# Patient Record
Sex: Female | Born: 1965 | Race: Black or African American | Hispanic: No | Marital: Single | Smoking: Never smoker
Health system: Southern US, Community
[De-identification: ages and names within clinical notes are randomized; demographics above are authoritative.]

## PROBLEM LIST (undated history)

## (undated) DIAGNOSIS — I428 Other cardiomyopathies: Secondary | ICD-10-CM

## (undated) DIAGNOSIS — G4733 Obstructive sleep apnea (adult) (pediatric): Secondary | ICD-10-CM

## (undated) DIAGNOSIS — I509 Heart failure, unspecified: Secondary | ICD-10-CM

## (undated) DIAGNOSIS — I10 Essential (primary) hypertension: Secondary | ICD-10-CM

## (undated) DIAGNOSIS — E042 Nontoxic multinodular goiter: Secondary | ICD-10-CM

## (undated) DIAGNOSIS — K449 Diaphragmatic hernia without obstruction or gangrene: Secondary | ICD-10-CM

## (undated) DIAGNOSIS — Z9289 Personal history of other medical treatment: Secondary | ICD-10-CM

## (undated) DIAGNOSIS — J9690 Respiratory failure, unspecified, unspecified whether with hypoxia or hypercapnia: Secondary | ICD-10-CM

## (undated) DIAGNOSIS — C8590 Non-Hodgkin lymphoma, unspecified, unspecified site: Secondary | ICD-10-CM

## (undated) HISTORY — PX: KNEE SURGERY: SHX244

## (undated) HISTORY — PX: OTHER SURGICAL HISTORY: SHX169

## (undated) HISTORY — DX: Obstructive sleep apnea (adult) (pediatric): G47.33

## (undated) HISTORY — PX: TUBAL LIGATION: SHX77

## (undated) HISTORY — PX: FEMUR SURGERY: SHX943

## (undated) HISTORY — PX: CHOLECYSTECTOMY: SHX55

---

## 2010-07-01 ENCOUNTER — Emergency Department (HOSPITAL_COMMUNITY): Payer: BC Managed Care – PPO

## 2010-07-01 ENCOUNTER — Emergency Department (HOSPITAL_COMMUNITY)
Admission: EM | Admit: 2010-07-01 | Discharge: 2010-07-01 | Disposition: A | Discharge status: Home / Self Care | Payer: BC Managed Care – PPO | Attending: Emergency Medicine | Admitting: Emergency Medicine

## 2010-07-01 DIAGNOSIS — M25539 Pain in unspecified wrist: Secondary | ICD-10-CM | POA: Insufficient documentation

## 2010-07-01 DIAGNOSIS — I10 Essential (primary) hypertension: Secondary | ICD-10-CM | POA: Insufficient documentation

## 2012-11-11 DIAGNOSIS — G8929 Other chronic pain: Secondary | ICD-10-CM | POA: Insufficient documentation

## 2012-11-11 DIAGNOSIS — C8590 Non-Hodgkin lymphoma, unspecified, unspecified site: Secondary | ICD-10-CM | POA: Insufficient documentation

## 2013-04-29 ENCOUNTER — Emergency Department (HOSPITAL_BASED_OUTPATIENT_CLINIC_OR_DEPARTMENT_OTHER): Payer: BC Managed Care – PPO

## 2013-04-29 ENCOUNTER — Emergency Department (HOSPITAL_BASED_OUTPATIENT_CLINIC_OR_DEPARTMENT_OTHER)
Admission: EM | Admit: 2013-04-29 | Discharge: 2013-04-30 | Disposition: A | Discharge status: Home / Self Care | Payer: BC Managed Care – PPO | Attending: Emergency Medicine | Admitting: Emergency Medicine

## 2013-04-29 ENCOUNTER — Encounter (HOSPITAL_BASED_OUTPATIENT_CLINIC_OR_DEPARTMENT_OTHER): Payer: Self-pay | Admitting: Emergency Medicine

## 2013-04-29 DIAGNOSIS — I10 Essential (primary) hypertension: Secondary | ICD-10-CM | POA: Insufficient documentation

## 2013-04-29 DIAGNOSIS — Z87898 Personal history of other specified conditions: Secondary | ICD-10-CM | POA: Insufficient documentation

## 2013-04-29 DIAGNOSIS — Z79899 Other long term (current) drug therapy: Secondary | ICD-10-CM | POA: Insufficient documentation

## 2013-04-29 DIAGNOSIS — S338XXA Sprain of other parts of lumbar spine and pelvis, initial encounter: Secondary | ICD-10-CM | POA: Insufficient documentation

## 2013-04-29 DIAGNOSIS — M542 Cervicalgia: Secondary | ICD-10-CM | POA: Insufficient documentation

## 2013-04-29 DIAGNOSIS — Y9289 Other specified places as the place of occurrence of the external cause: Secondary | ICD-10-CM | POA: Insufficient documentation

## 2013-04-29 DIAGNOSIS — T148XXA Other injury of unspecified body region, initial encounter: Secondary | ICD-10-CM

## 2013-04-29 DIAGNOSIS — X503XXA Overexertion from repetitive movements, initial encounter: Secondary | ICD-10-CM | POA: Insufficient documentation

## 2013-04-29 DIAGNOSIS — M62838 Other muscle spasm: Secondary | ICD-10-CM | POA: Insufficient documentation

## 2013-04-29 DIAGNOSIS — Y9389 Activity, other specified: Secondary | ICD-10-CM | POA: Insufficient documentation

## 2013-04-29 HISTORY — DX: Non-Hodgkin lymphoma, unspecified, unspecified site: C85.90

## 2013-04-29 HISTORY — DX: Essential (primary) hypertension: I10

## 2013-04-29 MED ORDER — METHOCARBAMOL 500 MG PO TABS
1000.0000 mg | ORAL_TABLET | Freq: Once | ORAL | Status: AC
  Administered 2013-04-29: 1000 mg via ORAL
  Filled 2013-04-29: qty 2

## 2013-04-29 MED ORDER — OXYCODONE-ACETAMINOPHEN 5-325 MG PO TABS
1.0000 | ORAL_TABLET | Freq: Once | ORAL | Status: AC
  Administered 2013-04-29: 1 via ORAL
  Filled 2013-04-29: qty 1

## 2013-04-29 NOTE — ED Notes (Signed)
Back and neck pain x 2 days. States she feels it is a pinched nerve. Her right shoulder is numb.

## 2013-04-29 NOTE — ED Provider Notes (Signed)
CSN: [id number]     Arrival date & time 04/29/13  2152 History   First MD Initiated Contact with Patient 04/29/13 2302     This chart was scribed for Lawana Hartzell Smitty Cords, MD by Manuela Schwartz, ED scribe. This patient was seen in room MH10/MH10 and the patient's care was started at 2302.  Chief Complaint  Patient presents with  . Back Pain   Patient is a 47 y.o. female presenting with back pain. The history is provided by the patient. No language interpreter was used.  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 female who presents to the Emergency Department complaining of back and neck pain, onset 2 days ago, no recent injury or trauma. She reports felt she strained her lower back while cranking/raising a bed at work. She reports ibuprofen w/out relief from pain. She states pain radiates to her right shoulder/arm (last episode was a couple of weeks ago). She has not seen anyone for this problem. She denies recent surgeries, new medicines, falls, trauma or skin rash.  Past Medical History  Diagnosis Date  . Lymphoma   . Hypertension    Past Surgical History  Procedure Laterality Date  . Tubal ligation    . Femur surgery    . Knee surgery    . Cholecystectomy    . Porta cath insertion      2 years   No family history on file. History   Substance Use Topics  . Smoking status: Never Smoker   . Smokeless tobacco: Not on file  . Alcohol Use: No   OB History   Grav Para Term Preterm Abortions TAB SAB Ect Mult Living                 Review of Systems  Constitutional: Negative for fever, chills and weight loss.  HENT: Negative for congestion and rhinorrhea.   Respiratory: Negative for cough and shortness of breath.   Cardiovascular: Negative for chest pain.  Gastrointestinal: Negative for nausea, vomiting, abdominal pain, diarrhea and bowel incontinence.  Genitourinary: Negative for bladder incontinence.  Musculoskeletal: Positive for back pain and neck pain.  Skin: Negative for color change and rash.  Neurological: Negative for syncope and weakness.  All other systems reviewed and are negative.   A complete 10 system review of systems was obtained and all systems are negative except as noted in the HPI and PMH.   Allergies  Review of patient's allergies indicates no known allergies.  Home Medications   Current Outpatient Rx  Name  Route  Sig  Dispense  Refill  . Cholecalciferol (VITAMIN D) 2000 UNITS CAPS   Oral   Take by mouth.         . Furosemide (LASIX PO)   Oral   Take by mouth.         . Iron-Folic Acid-Vit B12 (  IRON FORMULA PO)   Oral   Take by mouth.         Marland Kitchen LISINOPRIL PO   Oral   Take by mouth.          Triage Vitals: BP 132/83  Pulse 95  Temp(Src) 97.2 F (36.2 C) (Oral)  Resp 18  Ht 5' 6.5" (1.689 m)  Wt 242 lb (109.77 kg)  BMI 38.48 kg/m2  SpO2 100% Physical Exam  Nursing note and vitals reviewed. Constitutional: She is oriented to person, place, and time. She appears well-developed and well-nourished. No distress.  HENT:  Head: Normocephalic and atraumatic.  Mouth/Throat: Oropharynx is clear and moist.  Eyes: Conjunctivae are normal. Pupils are equal, round, and reactive to light. Right eye exhibits no discharge. Left eye exhibits no discharge.  Neck: Normal range  of motion. Neck supple. No tracheal deviation present.  Cardiovascular: Normal rate, regular rhythm, normal heart sounds and intact distal pulses.   No murmur heard. 2+ distal pulses Cap refill less than 2 sec bilateral hands  Pulmonary/Chest: Effort normal and breath sounds normal. No stridor. No respiratory distress. She has no wheezes. She has no rales.  Abdominal: Soft. Bowel sounds are normal. There is no tenderness.  Musculoskeletal: Normal range of motion. She exhibits no edema and no tenderness.   No step offs, crepitance nor point tenderness of the C, T, or L spine. Normal alignment. Negative neers test bilaterally Intact biceps, triceps tendons bilaterally 5/5 strength BUE/BLE BUE neurovascularly intact  Neurological: She is alert and oriented to person, place, and time. She has normal reflexes. She displays normal reflexes.  Skin: Skin is warm and dry.  Psychiatric: She has a normal mood and affect. Thought content normal.    ED Course  Procedures (including critical care time) DIAGNOSTIC STUDIES: Oxygen Saturation is 100% on room air, normal by my interpretation.    COORDINATION OF CARE: At 1120 PM Discussed treatment plan with patient which includes CXR, lumbar X-ray, pain medicine, robaxin. Patient agrees.   Labs Review Labs Reviewed - No data to display Imaging Review No results found.  EKG Interpretation   None       MDM  Will treat for muscle strain and spasm with NSAIDs pain meds and muscle relaxants   I personally performed the services described in this documentation, which was scribed in my presence. The recorded information has been reviewed and is accurate.     Freeland Pracht Smitty Cords, MD 04/30/13 [place]

## 2013-04-30 MED ORDER — HYDROCODONE-ACETAMINOPHEN 5-325 MG PO TABS: 1.0000 | ORAL_TABLET | Freq: Four times a day (QID) | ORAL | Status: DC | PRN

## 2013-04-30 MED ORDER — IBUPROFEN 800 MG PO TABS: 800.0000 mg | ORAL_TABLET | Freq: Three times a day (TID) | ORAL | Status: DC

## 2013-04-30 MED ORDER — METHOCARBAMOL 500 MG PO TABS: 500.0000 mg | ORAL_TABLET | Freq: Two times a day (BID) | ORAL | Status: DC

## 2013-05-24 ENCOUNTER — Encounter (HOSPITAL_BASED_OUTPATIENT_CLINIC_OR_DEPARTMENT_OTHER): Payer: Self-pay | Admitting: Emergency Medicine

## 2013-05-24 ENCOUNTER — Emergency Department (HOSPITAL_BASED_OUTPATIENT_CLINIC_OR_DEPARTMENT_OTHER): Payer: BC Managed Care – PPO

## 2013-05-24 ENCOUNTER — Inpatient Hospital Stay (HOSPITAL_BASED_OUTPATIENT_CLINIC_OR_DEPARTMENT_OTHER)
Admission: EM | Admit: 2013-05-24 | Discharge: 2013-05-27 | DRG: 293 | Disposition: A | Discharge status: Home / Self Care | Payer: BC Managed Care – PPO | Attending: Family Medicine | Admitting: Family Medicine

## 2013-05-24 DIAGNOSIS — Z923 Personal history of irradiation: Secondary | ICD-10-CM

## 2013-05-24 DIAGNOSIS — C8590 Non-Hodgkin lymphoma, unspecified, unspecified site: Secondary | ICD-10-CM

## 2013-05-24 DIAGNOSIS — Z87898 Personal history of other specified conditions: Secondary | ICD-10-CM

## 2013-05-24 DIAGNOSIS — M62838 Other muscle spasm: Secondary | ICD-10-CM | POA: Diagnosis present

## 2013-05-24 DIAGNOSIS — E876 Hypokalemia: Secondary | ICD-10-CM | POA: Diagnosis present

## 2013-05-24 DIAGNOSIS — Z8249 Family history of ischemic heart disease and other diseases of the circulatory system: Secondary | ICD-10-CM

## 2013-05-24 DIAGNOSIS — G8929 Other chronic pain: Secondary | ICD-10-CM | POA: Diagnosis present

## 2013-05-24 DIAGNOSIS — Z79899 Other long term (current) drug therapy: Secondary | ICD-10-CM

## 2013-05-24 DIAGNOSIS — Z9221 Personal history of antineoplastic chemotherapy: Secondary | ICD-10-CM

## 2013-05-24 DIAGNOSIS — I5033 Acute on chronic diastolic (congestive) heart failure: Principal | ICD-10-CM | POA: Diagnosis present

## 2013-05-24 DIAGNOSIS — F17200 Nicotine dependence, unspecified, uncomplicated: Secondary | ICD-10-CM | POA: Diagnosis present

## 2013-05-24 DIAGNOSIS — E042 Nontoxic multinodular goiter: Secondary | ICD-10-CM | POA: Diagnosis present

## 2013-05-24 DIAGNOSIS — I509 Heart failure, unspecified: Secondary | ICD-10-CM | POA: Diagnosis present

## 2013-05-24 DIAGNOSIS — I10 Essential (primary) hypertension: Secondary | ICD-10-CM

## 2013-05-24 HISTORY — DX: Respiratory failure, unspecified, unspecified whether with hypoxia or hypercapnia: J96.90

## 2013-05-24 HISTORY — DX: Personal history of other medical treatment: Z92.89

## 2013-05-24 HISTORY — DX: Nontoxic multinodular goiter: E04.2

## 2013-05-24 LAB — CBC WITH DIFFERENTIAL/PLATELET
BASOS ABS: 0 10*3/uL (ref 0.0–0.1)
BASOS PCT: 0 % (ref 0–1)
Eosinophils Absolute: 0.3 10*3/uL (ref 0.0–0.7)
Eosinophils Relative: 3 % (ref 0–5)
HEMATOCRIT: 36.4 % (ref 36.0–46.0)
HEMOGLOBIN: 11.8 g/dL — AB (ref 12.0–15.0)
LYMPHS PCT: 22 % (ref 12–46)
Lymphs Abs: 2.1 10*3/uL (ref 0.7–4.0)
MCH: 29.4 pg (ref 26.0–34.0)
MCHC: 32.4 g/dL (ref 30.0–36.0)
MCV: 90.8 fL (ref 78.0–100.0)
MONOS PCT: 7 % (ref 3–12)
Monocytes Absolute: 0.6 10*3/uL (ref 0.1–1.0)
NEUTROS ABS: 6.4 10*3/uL (ref 1.7–7.7)
NEUTROS PCT: 68 % (ref 43–77)
Platelets: 324 10*3/uL (ref 150–400)
RBC: 4.01 MIL/uL (ref 3.87–5.11)
RDW: 13.1 % (ref 11.5–15.5)
WBC: 9.3 10*3/uL (ref 4.0–10.5)

## 2013-05-24 LAB — BASIC METABOLIC PANEL
BUN: 18 mg/dL (ref 6–23)
CHLORIDE: 103 meq/L (ref 96–112)
CO2: 27 meq/L (ref 19–32)
Calcium: 9.1 mg/dL (ref 8.4–10.5)
Creatinine, Ser: 1.1 mg/dL (ref 0.50–1.10)
GFR calc non Af Amer: 59 mL/min — ABNORMAL LOW (ref >90)
GFR, EST AFRICAN AMERICAN: 69 mL/min — AB (ref >90)
Glucose, Bld: 103 mg/dL — ABNORMAL HIGH (ref 70–99)
POTASSIUM: 3.7 meq/L (ref 3.7–5.3)
Sodium: 142 mEq/L (ref 137–147)

## 2013-05-24 LAB — D-DIMER, QUANTITATIVE (NOT AT ARMC): D DIMER QUANT: 0.4 ug{FEU}/mL (ref 0.00–0.48)

## 2013-05-24 LAB — PRO B NATRIURETIC PEPTIDE: PRO B NATRI PEPTIDE: 2261 pg/mL — AB (ref 0–125)

## 2013-05-24 LAB — TROPONIN I: Troponin I: <0.3 ng/mL (ref <0.30)

## 2013-05-24 MED ORDER — ALBUTEROL SULFATE (2.5 MG/3ML) 0.083% IN NEBU
5.0000 mg | INHALATION_SOLUTION | Freq: Once | RESPIRATORY_TRACT | Status: AC
  Administered 2013-05-24: 5 mg via RESPIRATORY_TRACT
  Filled 2013-05-24: qty 6

## 2013-05-24 MED ORDER — FUROSEMIDE 10 MG/ML IJ SOLN
40.0000 mg | Freq: Once | INTRAMUSCULAR | Status: AC
  Administered 2013-05-24: 40 mg via INTRAVENOUS
  Filled 2013-05-24: qty 4

## 2013-05-24 NOTE — ED Notes (Addendum)
Pt states she is on lasix which usually helps with her SOB- states she is more winded since yesterday. States [facility] has ? Diagnosed her with COPD and/CHF. States they have referred her to [facility] for cardiology

## 2013-05-24 NOTE — ED Notes (Signed)
I raised patient in bed, helped her to comfortable position, then I placed taped-on type pulse ox device. I then used alcohol wipes over ecg pads to confirm good reading and placement. Patient resting comfortably asking for water to drink.

## 2013-05-24 NOTE — ED Notes (Signed)
I took second BP and got 133/93 a moment after last entry.

## 2013-05-24 NOTE — ED Notes (Signed)
Respiratory therapy called to bedside to assist with the wheezing, administer treatment.

## 2013-05-24 NOTE — ED Provider Notes (Addendum)
CSN: [id number]     Arrival date & time 05/24/13  1812 History   First MD Initiated Contact with Patient 05/24/13 2017     Chief Complaint  Patient presents with  . Shortness of Breath   (Consider location/radiation/quality/duration/timing/severity/associated sxs/prior Treatment) HPI Complains of shortness of breath and dyspnea with minimal exertion, onset one week ago. With minimal cough. No fever. Admits to some mild right-sided chest pain worse with cough.. Patient has taken Lasix for "fluid" though no known history of congestive heart failure. Her primary care physician has arranged for her to have an outpatient echocardiogram. Denies orthopnea. Past Medical History  Diagnosis Date  . Lymphoma   . Hypertension   . Multiple thyroid nodules    respiratory failure Past Surgical History  Procedure Laterality Date  . Tubal ligation    . Femur surgery    . Knee surgery    . Cholecystectomy    . Porta cath insertion      2 years   No family history on file. History  Substance Use Topics  . Smoking status: Current Some Day Smoker  . Smokeless tobacco: Never Used  . Alcohol Use: 0.6 oz/week    1 Glasses of wine per week   OB History   Grav Para Term Preterm Abortions TAB SAB Ect Mult Living                 Review of Systems  Respiratory: Positive for cough, shortness of breath and wheezing.   All other systems reviewed and are negative.    Allergies  Review of patient's allergies indicates no known allergies.  Home Medications   Current Outpatient Rx  Name  Route  Sig  Dispense  Refill  . Cholecalciferol (VITAMIN D) 2000 UNITS CAPS   Oral   Take by mouth.         . furosemide (LASIX) 40 MG tablet   Oral   Take 40 mg by mouth.         . gabapentin (NEURONTIN) 100 MG capsule   Oral   Take 100 mg by mouth daily.         Marland Kitchen HYDROcodone-acetaminophen (NORCO) 5-325 MG per tablet   Oral   Take 1 tablet by mouth every 6 (six) hours as needed.   10 tablet    0   . ibuprofen (ADVIL,MOTRIN) 800 MG tablet   Oral   Take 1 tablet (800 mg total) by mouth 3 (three) times daily. Take with food   21 tablet   0   . Iron-Folic Acid-Vit O96 (IRON FORMULA PO)   Oral   Take by mouth.         Marland Kitchen lisinopril-hydrochlorothiazide (PRINZIDE,ZESTORETIC) 20-25 MG per tablet   Oral   Take 1 tablet by mouth daily.         . methocarbamol (ROBAXIN) 500 MG tablet   Oral   Take 1 tablet (500 mg total) by mouth 2 (two) times daily.   20 tablet   0    BP 160/119  Pulse 104  Temp(Src) 97.8 F (36.6 C) (Oral)  Resp 22  Ht 5\' 6"  (1.676 m)  Wt 242 lb (109.77 kg)  BMI 39.08 kg/m2  SpO2 100% Physical Exam  Nursing note and vitals reviewed. Constitutional: She appears well-developed and well-nourished. She appears distressed.  Mild respiratory distress.  HENT:  Head: Normocephalic and atraumatic.  Eyes: Conjunctivae are normal. Pupils are equal, round, and reactive to light.  Neck: Neck supple. No JVD  present. No tracheal deviation present. No thyromegaly present.  Cardiovascular: Normal rate and regular rhythm.   No murmur heard. Pulmonary/Chest: Breath sounds normal. She has no wheezes.  Mild respiratory distress.. Prolonged expiration phase with expiratory wheezes Morbidly obese  Abdominal: Soft. Bowel sounds are normal. She exhibits no distension. There is no tenderness.  Morbidly obese  Musculoskeletal: Normal range of motion. She exhibits no edema and no tenderness.  Neurological: She is alert. Coordination normal.  Skin: Skin is warm and dry. No rash noted.  Psychiatric: She has a normal mood and affect.    ED Course  Procedures (including critical care time) Labs Review Labs Reviewed - No data to display Imaging Review No results found.  EKG Interpretation    Date/Time:  Saturday May 24 2013 18:23:06 EST Ventricular Rate:  106 PR Interval:  140 QRS Duration: 140 QT Interval:  392 QTC Calculation: 520 R Axis:   140 Text  Interpretation:  Sinus tachycardia Biatrial enlargement Right axis deviation Non-specific intra-ventricular conduction block Abnormal QRS-T angle, consider primary T wave abnormality Abnormal ECG No old tracing to compare Confirmed by Ethelda Chick  MD, Reegan Mctighe ([id number]) on 05/24/2013 8:36:11 PM          chest xray viewed by me Results for orders placed during the hospital encounter of 05/24/13  CBC WITH DIFFERENTIAL      Result Value Range   WBC 9.3  4.0 - 10.5 K/uL   RBC 4.01  3.87 - 5.11 MIL/uL   Hemoglobin 11.8 (*) 12.0 - 15.0 g/dL   HCT 41.7  40.8 - 14.4 %   MCV 90.8  78.0 - 100.0 fL   MCH 29.4  26.0 - 34.0 pg   MCHC 32.4  30.0 - 36.0 g/dL   RDW 81.8  56.3 - 14.9 %   Platelets 324  150 - 400 K/uL   Neutrophils Relative % 68  43 - 77 %   Neutro Abs 6.4  1.7 - 7.7 K/uL   Lymphocytes Relative 22  12 - 46 %   Lymphs Abs 2.1  0.7 - 4.0 K/uL   Monocytes Relative 7  3 - 12 %   Monocytes Absolute 0.6  0.1 - 1.0 K/uL   Eosinophils Relative 3  0 - 5 %   Eosinophils Absolute 0.3  0.0 - 0.7 K/uL   Basophils Relative 0  0 - 1 %   Basophils Absolute 0.0  0.0 - 0.1 K/uL  BASIC METABOLIC PANEL      Result Value Range   Sodium 142  137 - 147 mEq/L   Potassium 3.7  3.7 - 5.3 mEq/L   Chloride 103  96 - 112 mEq/L   CO2 27  19 - 32 mEq/L   Glucose, Bld 103 (*) 70 - 99 mg/dL   BUN 18  6 - 23 mg/dL   Creatinine, Ser 7.02  0.50 - 1.10 mg/dL   Calcium 9.1  8.4 - 63.7 mg/dL   GFR calc non Af Amer 59 (*) >90 mL/min   GFR calc Af Amer 69 (*) >90 mL/min  PRO B NATRIURETIC PEPTIDE      Result Value Range   Pro B Natriuretic peptide (BNP) 2261.0 (*) 0 - 125 pg/mL  TROPONIN I      Result Value Range   Troponin I <0.30  <0.30 ng/mL  D-DIMER, QUANTITATIVE      Result Value Range   D-Dimer, Quant 0.40  0.00 - 0.48 ug/mL-FEU   23:20 PM capacious his breathing was minimally  improved after nebulized treatment pain albuterol. Lasix ordered.  Chest xray viewed by me Dg Chest 2 View  05/24/2013   CLINICAL  DATA:  Shortness of breath.  EXAM: CHEST  2 VIEW  COMPARISON:  04/29/2013  FINDINGS: Left-sided Port-A-Cath is unchanged. Lungs are adequately inflated with minimal prominence of the right infrahilar markings which may be due to mild vascular congestion although cannot exclude early infection. Cardiomediastinal silhouette and remainder of the exam is unchanged.  IMPRESSION: Mild prominence of the right infrahilar markings which may be due to mild vascular congestion although cannot exclude early infection.   Electronically Signed   By: Marin Olp M.D.   On: 05/24/2013 20:22   Dg Chest 2 View  04/30/2013   CLINICAL DATA:  Upper back pain.  EXAM: CHEST  2 VIEW  COMPARISON:  None.  FINDINGS: The lungs are well-aerated and clear. There is no evidence of focal opacification, pleural effusion or pneumothorax.  The heart is normal in size; the mediastinal contour is within normal limits. No acute osseous abnormalities are seen. Clips are noted within the right upper quadrant, reflecting prior cholecystectomy. A left-sided chest port is noted, ending about the mid SVC.  IMPRESSION: No acute cardiopulmonary process seen. No displaced rib fractures identified.   Electronically Signed   By: Garald Balding M.D.   On: 04/30/2013 00:11   Dg Cervical Spine Complete  04/30/2013   CLINICAL DATA:  Right-sided neck pain.  EXAM: CERVICAL SPINE  4+ VIEWS  COMPARISON:  None.  FINDINGS: There is no evidence of fracture or subluxation. Vertebral bodies demonstrate normal height and alignment. Minimal disc space narrowing is noted along the lower cervical spine, with scattered anterior disc osteophyte complexes. Prevertebral soft tissues are within normal limits. The provided odontoid view demonstrates no significant abnormality.  The visualized lung apices are clear. A left-sided chest port is partially imaged.  IMPRESSION: No evidence of fracture or subluxation along the cervical spine.   Electronically Signed   By: Garald Balding M.D.   On: 04/30/2013 00:09   Dg Lumbar Spine Complete  04/30/2013   CLINICAL DATA:  Lower back pain for several days.  EXAM: LUMBAR SPINE - COMPLETE 4+ VIEW  COMPARISON:  None.  FINDINGS: There is no evidence of fracture or subluxation. Vertebral bodies demonstrate normal height and alignment. Intervertebral disc spaces are preserved. The visualized neural foramina are grossly unremarkable in appearance.  The visualized bowel gas pattern is unremarkable in appearance; air and stool are noted within the colon. The sacroiliac joints are within normal limits. Clips are noted within the right upper quadrant, reflecting prior cholecystectomy.  IMPRESSION: No evidence of fracture or subluxation along the lumbar spine.   Electronically Signed   By: Garald Balding M.D.   On: 04/30/2013 00:11     MDM  No diagnosis found. Doubt acute coronary syndrome. No chest pain. Normal troponin . Low pretest clinical soft suspicion for pulmonary embolism. Negative d-dimer. Elevated BNP and chest x-ray suggestive of congestive heart failure. Plan intravenous iritis. We will pull nitrates as patient's blood pressures are marginal. I spoke with Dr. Gasper Lloyd. Plan transfer [facility], telemetry Diagnosis congestive heart failure    Orlie Dakin, MD 05/24/13 [place], MD 05/24/13 2356

## 2013-05-25 ENCOUNTER — Encounter (HOSPITAL_COMMUNITY): Payer: Self-pay | Admitting: Internal Medicine

## 2013-05-25 ENCOUNTER — Inpatient Hospital Stay (HOSPITAL_COMMUNITY): Payer: BC Managed Care – PPO

## 2013-05-25 DIAGNOSIS — C8589 Other specified types of non-Hodgkin lymphoma, extranodal and solid organ sites: Secondary | ICD-10-CM

## 2013-05-25 DIAGNOSIS — E042 Nontoxic multinodular goiter: Secondary | ICD-10-CM | POA: Diagnosis present

## 2013-05-25 DIAGNOSIS — I10 Essential (primary) hypertension: Secondary | ICD-10-CM | POA: Diagnosis present

## 2013-05-25 DIAGNOSIS — I509 Heart failure, unspecified: Secondary | ICD-10-CM | POA: Diagnosis present

## 2013-05-25 LAB — CBC
HCT: 38.6 % (ref 36.0–46.0)
HEMOGLOBIN: 12.5 g/dL (ref 12.0–15.0)
MCH: 29.6 pg (ref 26.0–34.0)
MCHC: 32.4 g/dL (ref 30.0–36.0)
MCV: 91.3 fL (ref 78.0–100.0)
PLATELETS: 303 10*3/uL (ref 150–400)
RBC: 4.23 MIL/uL (ref 3.87–5.11)
RDW: 13.6 % (ref 11.5–15.5)
WBC: 9.3 10*3/uL (ref 4.0–10.5)

## 2013-05-25 LAB — COMPREHENSIVE METABOLIC PANEL
ALBUMIN: 3.3 g/dL — AB (ref 3.5–5.2)
ALT: 15 U/L (ref 0–35)
AST: 14 U/L (ref 0–37)
Alkaline Phosphatase: 65 U/L (ref 39–117)
BILIRUBIN TOTAL: 0.6 mg/dL (ref 0.3–1.2)
BUN: 17 mg/dL (ref 6–23)
CALCIUM: 8.9 mg/dL (ref 8.4–10.5)
CO2: 27 mEq/L (ref 19–32)
CREATININE: 1.06 mg/dL (ref 0.50–1.10)
Chloride: 100 mEq/L (ref 96–112)
GFR calc Af Amer: 72 mL/min — ABNORMAL LOW (ref >90)
GFR calc non Af Amer: 62 mL/min — ABNORMAL LOW (ref >90)
Glucose, Bld: 124 mg/dL — ABNORMAL HIGH (ref 70–99)
Potassium: 3.2 mEq/L — ABNORMAL LOW (ref 3.7–5.3)
Sodium: 141 mEq/L (ref 137–147)
TOTAL PROTEIN: 6.9 g/dL (ref 6.0–8.3)

## 2013-05-25 LAB — TROPONIN I: Troponin I: <0.3 ng/mL (ref <0.30)

## 2013-05-25 LAB — HEMOGLOBIN A1C
Hgb A1c MFr Bld: 5.3 % (ref <5.7)
Mean Plasma Glucose: 105 mg/dL (ref <117)

## 2013-05-25 LAB — MAGNESIUM: MAGNESIUM: 2 mg/dL (ref 1.5–2.5)

## 2013-05-25 LAB — POTASSIUM: POTASSIUM: 3.5 meq/L — AB (ref 3.7–5.3)

## 2013-05-25 LAB — TSH: TSH: 1.649 u[IU]/mL (ref 0.350–4.500)

## 2013-05-25 LAB — PROTIME-INR
INR: 1.05 (ref 0.00–1.49)
Prothrombin Time: 13.5 seconds (ref 11.6–15.2)

## 2013-05-25 MED ORDER — FUROSEMIDE 10 MG/ML IJ SOLN
40.0000 mg | Freq: Every day | INTRAMUSCULAR | Status: DC
  Administered 2013-05-25 – 2013-05-26 (×2): 40 mg via INTRAVENOUS
  Filled 2013-05-25 (×3): qty 4

## 2013-05-25 MED ORDER — SODIUM CHLORIDE 0.9 % IJ SOLN
3.0000 mL | Freq: Two times a day (BID) | INTRAMUSCULAR | Status: DC
  Administered 2013-05-25 – 2013-05-27 (×6): 3 mL via INTRAVENOUS

## 2013-05-25 MED ORDER — HYDROCHLOROTHIAZIDE 25 MG PO TABS
25.0000 mg | ORAL_TABLET | Freq: Every day | ORAL | Status: DC
  Administered 2013-05-25 – 2013-05-26 (×2): 25 mg via ORAL
  Filled 2013-05-25 (×2): qty 1

## 2013-05-25 MED ORDER — POTASSIUM CHLORIDE CRYS ER 20 MEQ PO TBCR
40.0000 meq | EXTENDED_RELEASE_TABLET | Freq: Once | ORAL | Status: AC
  Administered 2013-05-25: 40 meq via ORAL
  Filled 2013-05-25: qty 2

## 2013-05-25 MED ORDER — ACETAMINOPHEN 325 MG PO TABS: 650.0000 mg | ORAL_TABLET | Freq: Four times a day (QID) | ORAL | Status: DC | PRN

## 2013-05-25 MED ORDER — FUROSEMIDE 10 MG/ML IJ SOLN
40.0000 mg | Freq: Once | INTRAMUSCULAR | Status: AC
  Administered 2013-05-25: 40 mg via INTRAVENOUS

## 2013-05-25 MED ORDER — LISINOPRIL-HYDROCHLOROTHIAZIDE 20-25 MG PO TABS: 1.0000 | ORAL_TABLET | Freq: Every day | ORAL | Status: DC

## 2013-05-25 MED ORDER — ENOXAPARIN SODIUM 40 MG/0.4ML ~~LOC~~ SOLN
40.0000 mg | SUBCUTANEOUS | Status: DC
  Administered 2013-05-25 – 2013-05-26 (×2): 40 mg via SUBCUTANEOUS
  Filled 2013-05-25 (×3): qty 0.4

## 2013-05-25 MED ORDER — ASPIRIN EC 81 MG PO TBEC
81.0000 mg | DELAYED_RELEASE_TABLET | Freq: Every day | ORAL | Status: DC
  Administered 2013-05-25 – 2013-05-27 (×3): 81 mg via ORAL
  Filled 2013-05-25 (×4): qty 1

## 2013-05-25 MED ORDER — LISINOPRIL 20 MG PO TABS
20.0000 mg | ORAL_TABLET | Freq: Every day | ORAL | Status: DC
  Administered 2013-05-25 – 2013-05-27 (×3): 20 mg via ORAL
  Filled 2013-05-25 (×3): qty 1

## 2013-05-25 MED ORDER — HYDROCODONE-ACETAMINOPHEN 5-325 MG PO TABS
1.0000 | ORAL_TABLET | Freq: Four times a day (QID) | ORAL | Status: DC | PRN
  Administered 2013-05-25 – 2013-05-27 (×6): 1 via ORAL
  Filled 2013-05-25 (×6): qty 1

## 2013-05-25 MED ORDER — ACETAMINOPHEN 650 MG RE SUPP: 650.0000 mg | Freq: Four times a day (QID) | RECTAL | Status: DC | PRN

## 2013-05-25 MED ORDER — METHOCARBAMOL 500 MG PO TABS
500.0000 mg | ORAL_TABLET | Freq: Two times a day (BID) | ORAL | Status: DC
  Administered 2013-05-25 – 2013-05-27 (×6): 500 mg via ORAL
  Filled 2013-05-25 (×7): qty 1

## 2013-05-25 MED ORDER — GABAPENTIN 100 MG PO CAPS
100.0000 mg | ORAL_CAPSULE | Freq: Every day | ORAL | Status: DC
  Administered 2013-05-25 – 2013-05-27 (×3): 100 mg via ORAL
  Filled 2013-05-25 (×3): qty 1

## 2013-05-25 NOTE — ED Notes (Signed)
Pt did void prior to leaving with [facility].

## 2013-05-25 NOTE — ED Notes (Signed)
[facility] here and has assumed care of the patient.  Spoke with Sharyn Lull, [place] and informed her that patient does have right upper chest wall pain that is worse with movement....unchanged from original presentation.  No further questions were voiced.

## 2013-05-25 NOTE — Progress Notes (Signed)
Patient ID: Darlene Pierce, female   DOB: 05/13/1966, 48 y.o.   MRN: [id number] [facility] PROGRESS NOTE  Darlene Pierce CWC:[id number] DOB: June 30, 1965 DOA: 05/24/2013 PCP: No PCP Per Patient  Brief narrative: 48 y.o. female with Non-Hodgkin's lymphoma status post radiation in 2000 and chemotherapy in 2012, hypertension, thyroid nodule, admitted to [facility] 2 years ago with respiratory failure and was on ventilator, history of coronary angiogram last year, lives at home and has presented with main concern of one week duration of progressively worsening shortness of breath, initially present with exertion and now present at rest as well, associated with 2-3 pillow orthopnea, non productive cough. In addition she is concerned with dull chest discomfort that appears intermittently, 8/10 in severity and lasting 20-30 minutes at the time, occasionally but not consistently radiating to the left and right shoulder area, worse with movement and no specific alleviating factors. Pt has denies fevers, chills, abdominal or urinary concerns.   Principal Problem:   Acute on chronic heart failure - pt is clinically stable this AM - continuing Lasix for now - will need to follow upon 2 D ECHO - continue to monitor daily weights, I's and O's - monitor renal function while pt is on Lasix  Active Problems:   Congestive heart failure - weight this Am 246 lbs and will continue to trend while diuresing - management with Lasix as noted above    Chest tightness - possibly related to CHF - 3 sets of CE's negative - no events on telemetry - will provide supportive care for now with analgesia as needed    Hypokalemia  - secondary to Lasix, supplement and repeat BMP in AM   Lymphoma - hx of lymphoma, stable   Hypertension - reasonable inpatient control - continue Lisinopril, HCTZ, Lasix   Multiple thyroid nodules  Consultants:  None  Procedures/Studies: Dg Chest 2 View   05/24/2013   Mild prominence of the right infrahilar markings which may be due to mild vascular congestion although cannot exclude early infection.   Antibiotics:  None  Code Status: Full Family Communication: Pt at bedside Disposition Plan: Home when medically stable  HPI/Subjective: No events overnight.   Objective: Filed Vitals:   05/25/13 0200 05/25/13 0425 05/25/13 0500 05/25/13 1048  BP: 135/97  97/55 119/82  Pulse: 88  76   Temp: 97.9 F (36.6 C)  97.3 F (36.3 C)   TempSrc: Oral     Resp: 20  18   Height: 5' 6.5" (1.689 m)     Weight: 111.585 kg (246 lb) 111.585 kg (246 lb)    SpO2: 96%  99%     Intake/Output Summary (Last 24 hours) at 05/25/13 1327 Last data filed at 05/25/13 1051  Gross per 24 hour  Intake      3 ml  Output      0 ml  Net      3 ml    Exam:   General:  Pt is alert, follows commands appropriately, not in acute distress  Cardiovascular: Regular rate and rhythm, S1/S2, no murmurs, no rubs, no gallops  Respiratory: Clear to auscultation bilaterally, no wheezing, bibasilar crackles   Abdomen: Soft, non tender, non distended, bowel sounds present, no guarding  Extremities: Trace bilateral pitting LE edema, pulses DP and PT palpable bilaterally  Neuro: Grossly nonfocal  Data Reviewed: Basic Metabolic Panel:  Recent Labs Lab 05/24/13 2040 05/25/13 0450  NA 142 141  K 3.7 3.2*  CL 103 100  CO2  27 27  GLUCOSE 103* 124*  BUN 18 17  CREATININE 1.10 1.06  CALCIUM 9.1 8.9   Liver Function Tests:  Recent Labs Lab 05/25/13 0450  AST 14  ALT 15  ALKPHOS 65  BILITOT 0.6  PROT 6.9  ALBUMIN 3.3*   CBC:  Recent Labs Lab 05/24/13 2040 05/25/13 0450  WBC 9.3 9.3  NEUTROABS 6.4  --   HGB 11.8* 12.5  HCT 36.4 38.6  MCV 90.8 91.3  PLT 324 303   Cardiac Enzymes:  Recent Labs Lab 05/24/13 2040 05/25/13 0450 05/25/13 0935  TROPONINI <0.30 <0.30 <0.30    Scheduled Meds: . aspirin EC  81 mg Oral Daily  . enoxaparin  injection  40  mg Subcutaneous Q24H  . furosemide  40 mg Intravenous Daily  . gabapentin  100 mg Oral Daily  . hydrochlorothiazide  25 mg Oral Daily  . lisinopril  20 mg Oral Daily  . methocarbamol  500 mg Oral BID   Continuous Infusions:    Faye Ramsay, MD  [facility] Pager [phone]  If 7PM-7AM, please contact night-coverage [email or web address] Password [email or web address] 05/25/2013, 1:27 PM   LOS: 1 day

## 2013-05-25 NOTE — H&P (Signed)
[facility] History and Physical  Patient: Darlene Pierce  PPI:[id number]  DOB: [birth year]  DOS: the patient was seen and examined on 05/25/2013 PCP: No PCP Per Patient  Chief Complaint: Shortness of breath  HPI: Darlene Pierce is a 48 y.o. female with Past medical history of Non-Hodgkin's lymphoma status post radiation in 2000 and chemotherapy in 2012, hypertension, thyroid nodule, history of admitted to [facility] 2 years ago with respiratory failure and was on ventilator, history of coronary angiogram last year  Without any PCI per patient. The patient is coming from home. The patient presents with complaints of shortness of breath on exertion that has been ongoing since last one week associated with orthopnea. And today she was not able to write down and sleep because of shortness of breath and therefore she decided to come to the hospital. She also mentions that she had an episode of chest pain which was felt like a dull 8 located on the left side and also she had some pain on her right upper shoulder which was worsening with movement of the head and associated with some numbness of her right hand started at the same time. She currently is chest pain-free denies any shortness of breath. She mentions that she has improved after receiving the dose of Lasix. She mentions that she has been taking Lasix since last many years due to fluid buildup. She has history of coronary artery disease in her mother in 32s. She denies any cough or fever. Urinary symptoms or diarrhea or nausea or vomiting or abdominal pain.  Review of Systems: as mentioned in the history of present illness.  A Comprehensive review of the other systems is negative.  Past Medical History  Diagnosis Date  . Lymphoma     s/p chemotherapy 2012, radiation in 2000  . Hypertension   . Multiple thyroid nodules   . Respiratory failure     was admitted in [facility] and was on ventilator  . H/O  angiography     unsure of any PCI   Past Surgical History  Procedure Laterality Date  . Tubal ligation    . Femur surgery    . Knee surgery    . Cholecystectomy    . Porta cath insertion      2 years   Social History:  reports that she has been smoking.  She has never used smokeless tobacco. She reports that she drinks about 0.6 ounces of alcohol per week. She reports that she does not use illicit drugs. Independent for most of her  ADL.  No Known Allergies  No family history on file.  Prior to Admission medications   Medication Sig Start Date End Date Taking? Authorizing Provider  Cholecalciferol (VITAMIN D) 2000 UNITS CAPS Take by mouth.   Yes Historical Provider, MD  furosemide (LASIX) 40 MG tablet Take 40 mg by mouth.   Yes Historical Provider, MD  gabapentin (NEURONTIN) 100 MG capsule Take 100 mg by mouth daily.   Yes Historical Provider, MD  HYDROcodone-acetaminophen (NORCO) 5-325 MG per tablet Take 1 tablet by mouth every 6 (six) hours as needed. 04/30/13  Yes April K Palumbo-Rasch, MD  ibuprofen (ADVIL,MOTRIN) 800 MG tablet Take 1 tablet (800 mg total) by mouth 3 (three) times daily. Take with food 04/30/13  Yes April K Palumbo-Rasch, MD  Iron-Folic Acid-Vit A63 (IRON FORMULA PO) Take by mouth.   Yes Historical Provider, MD  lisinopril-hydrochlorothiazide (PRINZIDE,ZESTORETIC) 20-25 MG per tablet Take 1 tablet by mouth  daily.   Yes Historical Provider, MD  methocarbamol (ROBAXIN) 500 MG tablet Take 1 tablet (500 mg total) by mouth 2 (two) times daily. 04/30/13  Yes April K Palumbo-Rasch, MD    Physical Exam: Filed Vitals:   05/24/13 2035 05/24/13 2045 05/24/13 2329 05/25/13 0200  BP: 120/90  129/96 135/97  Pulse:   85 88  Temp:   97.9 F (36.6 C) 97.9 F (36.6 C)  TempSrc:   Oral Oral  Resp:   19 20  Height:    5' 6.5" (1.689 m)  Weight:    111.585 kg (246 lb)  SpO2:  100% 99% 96%    General: Alert, Awake and Oriented to Time, Place and Person. Appear in mild  distress Eyes: PERRL ENT: Oral Mucosa clear moist. Neck: no JVD Cardiovascular: S1 and S2 Present, no Murmur, Peripheral Pulses Present Respiratory: Bilateral Air entry equal and Decreased, Clear to Auscultation,  no Crackles,no wheezes Abdomen: Bowel Sound Present, Soft and Non tender Skin: no Rash Extremities: Bilateral Pedal edema, no calf tenderness Neurologic: Grossly Unremarkable.  Labs on Admission:  CBC:  Recent Labs Lab 05/24/13 2040  WBC 9.3  NEUTROABS 6.4  HGB 11.8*  HCT 36.4  MCV 90.8  PLT 324    CMP     Component Value Date/Time   NA 142 05/24/2013 2040   K 3.7 05/24/2013 2040   CL 103 05/24/2013 2040   CO2 27 05/24/2013 2040   GLUCOSE 103* 05/24/2013 2040   BUN 18 05/24/2013 2040   CREATININE 1.10 05/24/2013 2040   CALCIUM 9.1 05/24/2013 2040   GFRNONAA 59* 05/24/2013 2040   GFRAA 69* 05/24/2013 2040    No results found for this basename: LIPASE, AMYLASE,  in the last 168 hours No results found for this basename: AMMONIA,  in the last 168 hours   Recent Labs Lab 05/24/13 2040  TROPONINI <0.30   BNP (last 3 results)  Recent Labs  05/24/13 2040  PROBNP 2261.0*    Radiological Exams on Admission: Dg Chest 2 View  05/24/2013   CLINICAL DATA:  Shortness of breath.  EXAM: CHEST  2 VIEW  COMPARISON:  04/29/2013  FINDINGS: Left-sided Port-A-Cath is unchanged. Lungs are adequately inflated with minimal prominence of the right infrahilar markings which may be due to mild vascular congestion although cannot exclude early infection. Cardiomediastinal silhouette and remainder of the exam is unchanged.  IMPRESSION: Mild prominence of the right infrahilar markings which may be due to mild vascular congestion although cannot exclude early infection.   Electronically Signed   By: Marin Olp M.D.   On: 05/24/2013 20:22    EKG: Independently reviewed. LBBB.  Assessment/Plan Principal Problem:   Acute on chronic heart failure Active Problems:   Congestive heart failure    Lymphoma   Hypertension   Multiple thyroid nodules   1. Acute on chronic heart failure The patient is presenting complaints of shortness of breath that has been ongoing since last one week associated with orthopnea with elevated proBNP and chest x-ray that is suggestive of volume overload. She also has leg swelling and she mentions that she has gained almost 20 pounds in the last 1 month. With this she has received one dose of IV Lasix and mentions a significant improvement in her symptom.  She is not on any oxygen at present does not having chest pain at present. I would continue to monitor her serial troponin. I would admit her for telemetry. She has long-standing history of volume overload for  which she does not know the diagnosis I get an echocardiogram. I would also recommend to get records from [facility] for a cardiac workup. I will check TSH. I would continue her on IV Lasix 40 mg daily. I would repeat her BMP in the morning. Her heart failure could also be secondary to chemotherapy or radiation used and lymphoma treatment.  2.Hypertension Continue lisinopril and hydrochlorothiazide I will add aspirin 81 mg to her regimen I would also check lipid profile  3.Chronic pain Continue Norco  DVT Prophylaxis: subcutaneous Heparin Nutrition: Cardiac diet  Code Status: Full  Family Communication: Family was present at bedside, opportunity was given to ask question and all questions were answered satisfactorily at the time of interview. Disposition: Admitted to inpatient in telemetry unit.  Author: Berle Mull, MD [facility] Pager: [phone] 05/25/2013, 3:09 AM    If 7PM-7AM, please contact night-coverage [email or web address] Password [email or web address]

## 2013-05-26 ENCOUNTER — Encounter (HOSPITAL_COMMUNITY): Payer: Self-pay | Admitting: Cardiology

## 2013-05-26 DIAGNOSIS — I10 Essential (primary) hypertension: Secondary | ICD-10-CM

## 2013-05-26 DIAGNOSIS — R079 Chest pain, unspecified: Secondary | ICD-10-CM

## 2013-05-26 DIAGNOSIS — I369 Nonrheumatic tricuspid valve disorder, unspecified: Secondary | ICD-10-CM

## 2013-05-26 DIAGNOSIS — I5043 Acute on chronic combined systolic (congestive) and diastolic (congestive) heart failure: Secondary | ICD-10-CM

## 2013-05-26 DIAGNOSIS — I509 Heart failure, unspecified: Secondary | ICD-10-CM

## 2013-05-26 LAB — CBC
HCT: 41.4 % (ref 36.0–46.0)
Hemoglobin: 13.5 g/dL (ref 12.0–15.0)
MCH: 29.7 pg (ref 26.0–34.0)
MCHC: 32.6 g/dL (ref 30.0–36.0)
MCV: 91 fL (ref 78.0–100.0)
PLATELETS: 317 10*3/uL (ref 150–400)
RBC: 4.55 MIL/uL (ref 3.87–5.11)
RDW: 13.5 % (ref 11.5–15.5)
WBC: 7.9 10*3/uL (ref 4.0–10.5)

## 2013-05-26 LAB — BASIC METABOLIC PANEL
BUN: 20 mg/dL (ref 6–23)
CALCIUM: 9.3 mg/dL (ref 8.4–10.5)
CO2: 32 mEq/L (ref 19–32)
Chloride: 97 mEq/L (ref 96–112)
Creatinine, Ser: 1.21 mg/dL — ABNORMAL HIGH (ref 0.50–1.10)
GFR calc Af Amer: 61 mL/min — ABNORMAL LOW (ref >90)
GFR, EST NON AFRICAN AMERICAN: 53 mL/min — AB (ref >90)
Glucose, Bld: 112 mg/dL — ABNORMAL HIGH (ref 70–99)
POTASSIUM: 3.8 meq/L (ref 3.7–5.3)
Sodium: 140 mEq/L (ref 137–147)

## 2013-05-26 MED ORDER — AMLODIPINE BESYLATE 10 MG PO TABS
10.0000 mg | ORAL_TABLET | Freq: Every day | ORAL | Status: DC
  Administered 2013-05-27: 10 mg via ORAL
  Filled 2013-05-26: qty 1

## 2013-05-26 MED ORDER — FUROSEMIDE 40 MG PO TABS
40.0000 mg | ORAL_TABLET | Freq: Every day | ORAL | Status: DC
  Administered 2013-05-27: 40 mg via ORAL
  Filled 2013-05-26 (×2): qty 1

## 2013-05-26 MED ORDER — POTASSIUM CHLORIDE CRYS ER 20 MEQ PO TBCR
40.0000 meq | EXTENDED_RELEASE_TABLET | ORAL | Status: AC
  Administered 2013-05-26 (×2): 40 meq via ORAL
  Filled 2013-05-26 (×2): qty 2

## 2013-05-26 NOTE — Progress Notes (Signed)
The Chaplain offered emotional and spiritual support to the patient today. The patient was actually on the phone when the [place] walked into the room, so the visit between the [place] and patient was kept very brief. The patient expressed to the Chaplain that she desires to go home and that she needs prayer that the health challenges she is experiencing  at the moment will subside. The Chaplain conceded to the requests of the patient and prayed for her requests.  Chaplain Clista Bernhardt Earland Reish

## 2013-05-26 NOTE — Care Management Note (Unsigned)
    Page 1 of 1   05/26/2013     12:36:57 PM   CARE MANAGEMENT NOTE 05/26/2013  Patient:  Darlene Pierce, Darlene Pierce   Account Number:  [id number]  Date Initiated:  05/26/2013  Documentation initiated by:  GRAVES-BIGELOW,Cathren Sween  Subjective/Objective Assessment:   Pt admitted for SOB. Initiated on iv lasix.     Action/Plan:   CM will continue to monitor for disposiiton needs.   Anticipated DC Date:  05/28/2013   Anticipated DC Plan:  [place]  CM consult      Choice offered to / List presented to:             Status of service:  In process, will continue to follow Medicare Important Message given?   (If response is "NO", the following Medicare IM given date fields will be blank) Date Medicare IM given:   Date Additional Medicare IM given:    Discharge Disposition:    Per UR Regulation:  Reviewed for med. necessity/level of care/duration of stay  If discussed at [place] of Stay Meetings, dates discussed:    Comments:

## 2013-05-26 NOTE — Progress Notes (Signed)
UR Completed Tacie Mccuistion Graves-Bigelow, RN,BSN [phone]  

## 2013-05-26 NOTE — Consult Note (Addendum)
HPI: 47 are old female with past medical history of hypertension, lymphoma status post radiation and chemotherapy for evaluation of chest pain and congestive heart failure. Patient apparently had a cardiac catheterization in [facility] approximately one year ago. I do not have those results available but apparently there was no coronary disease noted. She has chronic dyspnea on exertion. However approximately 4 days ago this worsened. She denies orthopnea, PND but there was mild pedal edema. There was mild abdominal distention. She also described chest tightness. This increased with lying flat. He improved with sitting up. She was admitted by the hospitalist service and was diuresed with improvement. Cardiology is asked to evaluate.  Medications Prior to Admission  Medication Sig Dispense Refill  . albuterol (PROVENTIL HFA;VENTOLIN HFA) 108 (90 BASE) MCG/ACT inhaler Inhale 2 puffs into the lungs every 6 (six) hours as needed for wheezing or shortness of breath.      . Cholecalciferol (VITAMIN D) 2000 UNITS tablet Take 2,000 Units by mouth daily.      . furosemide (LASIX) 40 MG tablet Take 40 mg by mouth.      . gabapentin (NEURONTIN) 100 MG capsule Take 100 mg by mouth daily.      Marland Kitchen HYDROcodone-acetaminophen (NORCO) 5-325 MG per tablet Take 1 tablet by mouth every 6 (six) hours as needed.  10 tablet  0  . ibuprofen (ADVIL,MOTRIN) 800 MG tablet Take 1 tablet (800 mg total) by mouth 3 (three) times daily. Take with food  21 tablet  0  . Iron-Folic Acid-Vit J50 (IRON FORMULA) 27-400-100 MG-MCG-MCG CAPS Take 1 tablet by mouth daily.      Marland Kitchen lisinopril-hydrochlorothiazide (PRINZIDE,ZESTORETIC) 20-25 MG per tablet Take 1 tablet by mouth daily.      . methocarbamol (ROBAXIN) 500 MG tablet Take 1 tablet (500 mg total) by mouth 2 (two) times daily.  20 tablet  0    No Known Allergies  Past Medical History  Diagnosis Date  . Lymphoma     s/p chemotherapy 2012, radiation in 2000  .  Hypertension   . Multiple thyroid nodules   . Respiratory failure     was admitted in [facility] and was on ventilator  . H/O angiography     unsure of any PCI    Past Surgical History  Procedure Laterality Date  . Tubal ligation    . Femur surgery    . Knee surgery    . Cholecystectomy    . Porta cath insertion      2 years    History   Social History  . Marital Status: Single    Spouse Name: N/A    Number of Children: 4  . Years of Education: N/A   Occupational History  .     Social History Main Topics  . Smoking status: Never Smoker   . Smokeless tobacco: Never Used  . Alcohol Use: 0.6 oz/week    1 Glasses of wine per week     Comment: Occasional  . Drug Use: No  . Sexual Activity: Not on file   Other Topics Concern  . Not on file   Social History Narrative  . No narrative on file    Family History  Problem Relation Age of Onset  . CAD Mother     ROS:  no fevers or chills, productive cough, hemoptysis, dysphasia, odynophagia, melena, hematochezia, dysuria, hematuria, rash, seizure activity, orthopnea, PND, pedal edema, claudication. Remaining systems are negative.  Physical Exam:  Blood pressure 99/56, pulse 73, temperature 98.5 F (36.9 C), temperature source Oral, resp. rate 15, height 5' 6.5" (1.689 m), weight 242 lb 9.6 oz (110.043 kg), SpO2 96.00%.  General:  Well developed/obese in NAD Skin warm/dry Patient not depressed No peripheral clubbing Back-normal HEENT-normal/normal eyelids Neck supple/normal carotid upstroke bilaterally; no bruits; no JVD; no thyromegaly chest - CTA/ normal expansion CV - RRR/normal S1 and S2; no murmurs, rubs or gallops;  PMI nondisplaced Abdomen -NT/ND, no HSM, no mass, + bowel sounds, no bruit 2+ femoral pulses, no bruits Ext-no edema, chords, 2+ DP Neuro-grossly nonfocal  ECG sinus rhythm with left bundle branch block, biatrial enlargement  Results for orders placed during the hospital encounter  of 05/24/13 (from the past 48 hour(s))  CBC WITH DIFFERENTIAL     Status: Abnormal   Collection Time    05/24/13  8:40 PM      Result Value Range   WBC 9.3  4.0 - 10.5 K/uL   RBC 4.01  3.87 - 5.11 MIL/uL   Hemoglobin 11.8 (*) 12.0 - 15.0 g/dL   HCT 36.4  36.0 - 46.0 %   MCV 90.8  78.0 - 100.0 fL   MCH 29.4  26.0 - 34.0 pg   MCHC 32.4  30.0 - 36.0 g/dL   RDW 13.1  11.5 - 15.5 %   Platelets 324  150 - 400 K/uL   Neutrophils Relative % 68  43 - 77 %   Neutro Abs 6.4  1.7 - 7.7 K/uL   Lymphocytes Relative 22  12 - 46 %   Lymphs Abs 2.1  0.7 - 4.0 K/uL   Monocytes Relative 7  3 - 12 %   Monocytes Absolute 0.6  0.1 - 1.0 K/uL   Eosinophils Relative 3  0 - 5 %   Eosinophils Absolute 0.3  0.0 - 0.7 K/uL   Basophils Relative 0  0 - 1 %   Basophils Absolute 0.0  0.0 - 0.1 K/uL  BASIC METABOLIC PANEL     Status: Abnormal   Collection Time    05/24/13  8:40 PM      Result Value Range   Sodium 142  137 - 147 mEq/L   Comment: Please note change in reference range.   Potassium 3.7  3.7 - 5.3 mEq/L   Comment: Please note change in reference range.   Chloride 103  96 - 112 mEq/L   CO2 27  19 - 32 mEq/L   Glucose, Bld 103 (*) 70 - 99 mg/dL   BUN 18  6 - 23 mg/dL   Creatinine, Ser 1.10  0.50 - 1.10 mg/dL   Calcium 9.1  8.4 - 10.5 mg/dL   GFR calc non Af Amer 59 (*) >90 mL/min   GFR calc Af Amer 69 (*) >90 mL/min   Comment: (NOTE)     The eGFR has been calculated using the CKD EPI equation.     This calculation has not been validated in all clinical situations.     eGFR's persistently <90 mL/min signify possible Chronic Kidney     Disease.  PRO B NATRIURETIC PEPTIDE     Status: Abnormal   Collection Time    05/24/13  8:40 PM      Result Value Range   Pro B Natriuretic peptide (BNP) 2261.0 (*) 0 - 125 pg/mL  TROPONIN I     Status: None   Collection Time    05/24/13  8:40 PM      Result  Value Range   Troponin I <0.30  <0.30 ng/mL   Comment:            Due to the release kinetics of  cTnI,     a negative result within the first hours     of the onset of symptoms does not rule out     myocardial infarction with certainty.     If myocardial infarction is still suspected,     repeat the test at appropriate intervals.  D-DIMER, QUANTITATIVE     Status: None   Collection Time    05/24/13  8:40 PM      Result Value Range   D-Dimer, Quant 0.40  0.00 - 0.48 ug/mL-FEU   Comment:            AT THE INHOUSE ESTABLISHED CUTOFF     VALUE OF 0.48 ug/mL FEU,     THIS ASSAY HAS BEEN DOCUMENTED     IN THE LITERATURE TO HAVE     A SENSITIVITY AND NEGATIVE     PREDICTIVE VALUE OF AT LEAST     98 TO 99%.  THE TEST RESULT     SHOULD BE CORRELATED WITH     AN ASSESSMENT OF THE CLINICAL     PROBABILITY OF DVT / VTE.  COMPREHENSIVE METABOLIC PANEL     Status: Abnormal   Collection Time    05/25/13  4:50 AM      Result Value Range   Sodium 141  137 - 147 mEq/L   Comment: Please note change in reference range.   Potassium 3.2 (*) 3.7 - 5.3 mEq/L   Comment: Please note change in reference range.   Chloride 100  96 - 112 mEq/L   CO2 27  19 - 32 mEq/L   Glucose, Bld 124 (*) 70 - 99 mg/dL   BUN 17  6 - 23 mg/dL   Creatinine, Ser 1.06  0.50 - 1.10 mg/dL   Calcium 8.9  8.4 - 10.5 mg/dL   Total Protein 6.9  6.0 - 8.3 g/dL   Albumin 3.3 (*) 3.5 - 5.2 g/dL   AST 14  0 - 37 U/L   ALT 15  0 - 35 U/L   Alkaline Phosphatase 65  39 - 117 U/L   Total Bilirubin 0.6  0.3 - 1.2 mg/dL   GFR calc non Af Amer 62 (*) >90 mL/min   GFR calc Af Amer 72 (*) >90 mL/min   Comment: (NOTE)     The eGFR has been calculated using the CKD EPI equation.     This calculation has not been validated in all clinical situations.     eGFR's persistently <90 mL/min signify possible Chronic Kidney     Disease.  CBC     Status: None   Collection Time    05/25/13  4:50 AM      Result Value Range   WBC 9.3  4.0 - 10.5 K/uL   RBC 4.23  3.87 - 5.11 MIL/uL   Hemoglobin 12.5  12.0 - 15.0 g/dL   HCT 38.6  36.0 -  46.0 %   MCV 91.3  78.0 - 100.0 fL   MCH 29.6  26.0 - 34.0 pg   MCHC 32.4  30.0 - 36.0 g/dL   RDW 13.6  11.5 - 15.5 %   Platelets 303  150 - 400 K/uL  PROTIME-INR     Status: None   Collection Time    05/25/13  4:50 AM  Result Value Range   Prothrombin Time 13.5  11.6 - 15.2 seconds   INR 1.05  0.00 - 1.49  TROPONIN I     Status: None   Collection Time    05/25/13  4:50 AM      Result Value Range   Troponin I <0.30  <0.30 ng/mL   Comment:            Due to the release kinetics of cTnI,     a negative result within the first hours     of the onset of symptoms does not rule out     myocardial infarction with certainty.     If myocardial infarction is still suspected,     repeat the test at appropriate intervals.  TSH     Status: None   Collection Time    05/25/13  9:10 AM      Result Value Range   TSH 1.649  0.350 - 4.500 uIU/mL   Comment: Performed at [facility] A1C     Status: None   Collection Time    05/25/13  9:10 AM      Result Value Range   Hemoglobin A1C 5.3  <5.7 %   Comment: (NOTE)                                                                               According to the ADA Clinical Practice Recommendations for 2011, when     HbA1c is used as a screening test:      >=6.5%   Diagnostic of Diabetes Mellitus               (if abnormal result is confirmed)     5.7-6.4%   Increased risk of developing Diabetes Mellitus     References:Diagnosis and Classification of Diabetes Mellitus,Diabetes     FKCL,2751,70(YFVCB 1):S62-S69 and Standards of Medical Care in             Diabetes - 2011,Diabetes Care,2011,34 (Suppl 1):S11-S61.   Mean Plasma Glucose 105  <117 mg/dL   Comment: Performed at [facility]  TROPONIN I     Status: None   Collection Time    05/25/13  9:35 AM      Result Value Range   Troponin I <0.30  <0.30 ng/mL   Comment:            Due to the release kinetics of cTnI,     a negative result within the first hours       of the onset of symptoms does not rule out     myocardial infarction with certainty.     If myocardial infarction is still suspected,     repeat the test at appropriate intervals.  TROPONIN I     Status: None   Collection Time    05/25/13  2:52 PM      Result Value Range   Troponin I <0.30  <0.30 ng/mL   Comment:            Due to the release kinetics of cTnI,     a negative result within the first hours     of the onset  of symptoms does not rule out     myocardial infarction with certainty.     If myocardial infarction is still suspected,     repeat the test at appropriate intervals.  POTASSIUM     Status: Abnormal   Collection Time    05/25/13 10:10 PM      Result Value Range   Potassium 3.5 (*) 3.7 - 5.3 mEq/L   Comment: Please note change in reference range.  MAGNESIUM     Status: None   Collection Time    05/25/13 10:10 PM      Result Value Range   Magnesium 2.0  1.5 - 2.5 mg/dL  CBC     Status: None   Collection Time    05/26/13  5:40 AM      Result Value Range   WBC 7.9  4.0 - 10.5 K/uL   RBC 4.55  3.87 - 5.11 MIL/uL   Hemoglobin 13.5  12.0 - 15.0 g/dL   HCT 41.4  36.0 - 46.0 %   MCV 91.0  78.0 - 100.0 fL   MCH 29.7  26.0 - 34.0 pg   MCHC 32.6  30.0 - 36.0 g/dL   RDW 13.5  11.5 - 15.5 %   Platelets 317  150 - 400 K/uL  BASIC METABOLIC PANEL     Status: Abnormal   Collection Time    05/26/13  5:40 AM      Result Value Range   Sodium 140  137 - 147 mEq/L   Comment: Please note change in reference range.   Potassium 3.8  3.7 - 5.3 mEq/L   Comment: Please note change in reference range.   Chloride 97  96 - 112 mEq/L   CO2 32  19 - 32 mEq/L   Glucose, Bld 112 (*) 70 - 99 mg/dL   BUN 20  6 - 23 mg/dL   Creatinine, Ser 1.21 (*) 0.50 - 1.10 mg/dL   Calcium 9.3  8.4 - 10.5 mg/dL   GFR calc non Af Amer 53 (*) >90 mL/min   GFR calc Af Amer 61 (*) >90 mL/min   Comment: (NOTE)     The eGFR has been calculated using the CKD EPI equation.     This calculation  has not been validated in all clinical situations.     eGFR's persistently <90 mL/min signify possible Chronic Kidney     Disease.    Dg Chest 2 View  05/24/2013   CLINICAL DATA:  Shortness of breath.  EXAM: CHEST  2 VIEW  COMPARISON:  04/29/2013  FINDINGS: Left-sided Port-A-Cath is unchanged. Lungs are adequately inflated with minimal prominence of the right infrahilar markings which may be due to mild vascular congestion although cannot exclude early infection. Cardiomediastinal silhouette and remainder of the exam is unchanged.  IMPRESSION: Mild prominence of the right infrahilar markings which may be due to mild vascular congestion although cannot exclude early infection.   Electronically Signed   By: Marin Olp M.D.   On: 05/24/2013 20:22   Dg Shoulder Right  05/25/2013   CLINICAL DATA:  Right shoulder pain  EXAM: RIGHT SHOULDER - 2+ VIEW  COMPARISON:  DG CHEST 2 VIEW dated 04/29/2013  FINDINGS: There is no fracture or dislocation. There are mild degenerative changes of the acromioclavicular joint. There are enthesopathic changes at the deltoid origin.  IMPRESSION: No acute osseous injury of the right shoulder.   Electronically Signed   By: Kathreen Devoid   On: 05/25/2013 19:39  Assessment/Plan 1 acute on chronic combined systolic/diastolic congestive heart failure-her symptoms are much improved. We'll change Lasix to 40 mg daily. She can take an additional 40 mg daily as needed for weight gain of 2-3 pounds. Patient instructed on low sodium diet. 2 chest pain-enzymes are negative. Symptoms are atypical and most likely related to congestive heart failure. She had a previous catheterization in [facility] and apparently did not reveal coronary disease. We will obtain those records. If no coronary disease noted on previous catheterization would not pursue further cardiac evaluation. 3 hypertension-blood pressure is controlled. Continue present medications. I would be happy to follow her for her  congestive heart failure as an outpatient in [facility].  Please feel free to call with further questions.  Kirk Ruths MD 05/26/2013, 3:24 PM    [place] on 05/26/12  Cardiac catheterization done on 12/08/2011 revealed normal coronary arteries. Moderate to severe global LV systolic dysfunction. LV ejection fraction of 35%. Diagnostic procedure recommendations were to continue medical therapy for low LV dysfunction.   Perry Mount PA-C [facility]

## 2013-05-26 NOTE — Progress Notes (Signed)
  Echocardiogram 2D Echocardiogram has been performed.  Darlene Pierce Darlene Pierce 05/26/2013, 10:35 AM

## 2013-05-26 NOTE — Progress Notes (Addendum)
[facility] PROGRESS NOTE  Darlene Pierce WUJ:[id number] DOB: Jan 23, 1966 DOA: 05/24/2013 PCP: No PCP Per Patient  Assessment/Plan: 1. Acute on chronic diastolic CHF- Patient was started on IV lasix and has responded well, no dyspnea at this time. 2d echo shows grade 1 diastolic dysfunction.systolic function is mildly reduced, will obtain cardiology consultation. Cardiac enzymex x3 are negative 2. Lymphoma- stable. 3. Hypertension- Will continue with lisinopril, and discontinue HCTZ as patient is also on lasix.Will start amlodipine 10 mg po daily. 4. Muscle spasms- Secondary to hypokalemia, wil replace potassium and check BMP in am.  Code Status: Full code Family Communication: *Discussed with patient Disposition Plan:  Home when stable   Consultants:  Cardiology  Procedures:  None  Antibiotics:  None  HPI/Subjective: Patient seen and examined, admitted with dyspnea, CHF exacerbation. Responded to IV lasix.Complains of muscle spasms.  Objective: Filed Vitals:   05/26/13 0540  BP: 99/56  Pulse: 73  Temp: 98.5 F (36.9 C)  Resp: 15    Intake/Output Summary (Last 24 hours) at 05/26/13 1920 Last data filed at 05/26/13 1813  Gross per 24 hour  Intake    123 ml  Output   2300 ml  Net  -2177 ml   Filed Weights   05/25/13 0200 05/25/13 0425 05/26/13 0500  Weight: 111.585 kg (246 lb) 111.585 kg (246 lb) 110.043 kg (242 lb 9.6 oz)    Exam:  Physical Exam: Head: Normocephalic, atraumatic.  Eyes: No signs of jaundice, EOMI Nose: Mucous membranes dry.  Throat: Oropharynx nonerythematous, no exudate appreciated.  Neck: supple,No deformities, masses, or tenderness noted. Lungs: Normal respiratory effort. B/L Clear to auscultation, no crackles or wheezes.  Heart: Regular RR. S1 and S2 normal  Abdomen: BS normoactive. Soft, Nondistended, non-tender.  Extremities: No pretibial edema, no erythema   Data Reviewed: Basic Metabolic Panel:  Recent Labs Lab  05/24/13 2040 05/25/13 0450 05/25/13 2210 05/26/13 0540  NA 142 141  --  140  K 3.7 3.2* 3.5* 3.8  CL 103 100  --  97  CO2 27 27  --  32  GLUCOSE 103* 124*  --  112*  BUN 18 17  --  20  CREATININE 1.10 1.06  --  1.21*  CALCIUM 9.1 8.9  --  9.3  MG  --   --  2.0  --    Liver Function Tests:  Recent Labs Lab 05/25/13 0450  AST 14  ALT 15  ALKPHOS 65  BILITOT 0.6  PROT 6.9  ALBUMIN 3.3*   No results found for this basename: LIPASE, AMYLASE,  in the last 168 hours No results found for this basename: AMMONIA,  in the last 168 hours CBC:  Recent Labs Lab 05/24/13 2040 05/25/13 0450 05/26/13 0540  WBC 9.3 9.3 7.9  NEUTROABS 6.4  --   --   HGB 11.8* 12.5 13.5  HCT 36.4 38.6 41.4  MCV 90.8 91.3 91.0  PLT 324 303 317   Cardiac Enzymes:  Recent Labs Lab 05/24/13 2040 05/25/13 0450 05/25/13 0935 05/25/13 1452  TROPONINI <0.30 <0.30 <0.30 <0.30   BNP (last 3 results)  Recent Labs  05/24/13 2040  PROBNP 2261.0*   CBG: No results found for this basename: GLUCAP,  in the last 168 hours  No results found for this or any previous visit (from the past 240 hour(s)).   Studies: Dg Chest 2 View  05/24/2013   CLINICAL DATA:  Shortness of breath.  EXAM: CHEST  2 VIEW  COMPARISON:  04/29/2013  FINDINGS:  Left-sided Port-A-Cath is unchanged. Lungs are adequately inflated with minimal prominence of the right infrahilar markings which may be due to mild vascular congestion although cannot exclude early infection. Cardiomediastinal silhouette and remainder of the exam is unchanged.  IMPRESSION: Mild prominence of the right infrahilar markings which may be due to mild vascular congestion although cannot exclude early infection.   Electronically Signed   By: Marin Olp M.D.   On: 05/24/2013 20:22   Dg Shoulder Right  05/25/2013   CLINICAL DATA:  Right shoulder pain  EXAM: RIGHT SHOULDER - 2+ VIEW  COMPARISON:  DG CHEST 2 VIEW dated 04/29/2013  FINDINGS: There is no fracture or  dislocation. There are mild degenerative changes of the acromioclavicular joint. There are enthesopathic changes at the deltoid origin.  IMPRESSION: No acute osseous injury of the right shoulder.   Electronically Signed   By: Kathreen Devoid   On: 05/25/2013 19:39    Scheduled Meds: . aspirin EC  81 mg Oral Daily  . enoxaparin (LOVENOX) injection  40 mg Subcutaneous Q24H  . furosemide  40 mg Oral Daily  . gabapentin  100 mg Oral Daily  . hydrochlorothiazide  25 mg Oral Daily  . lisinopril  20 mg Oral Daily  . methocarbamol  500 mg Oral BID  . potassium chloride  40 mEq Oral Q4H  . sodium chloride  3 mL Intravenous Q12H   Continuous Infusions:   Principal Problem:   Acute on chronic heart failure Active Problems:   Congestive heart failure   Lymphoma   Hypertension   Multiple thyroid nodules    Time spent: 25 min    [facility] Pager [phone]. If 7PM-7AM, please contact night-coverage at [email or web address], password [facility] 05/26/2013, 7:20 PM  LOS: 2 days

## 2013-05-27 LAB — BASIC METABOLIC PANEL
BUN: 25 mg/dL — ABNORMAL HIGH (ref 6–23)
CALCIUM: 8.8 mg/dL (ref 8.4–10.5)
CO2: 27 meq/L (ref 19–32)
CREATININE: 1.15 mg/dL — AB (ref 0.50–1.10)
Chloride: 95 mEq/L — ABNORMAL LOW (ref 96–112)
GFR calc Af Amer: 65 mL/min — ABNORMAL LOW (ref >90)
GFR calc non Af Amer: 56 mL/min — ABNORMAL LOW (ref >90)
GLUCOSE: 117 mg/dL — AB (ref 70–99)
Potassium: 3.9 mEq/L (ref 3.7–5.3)
Sodium: 137 mEq/L (ref 137–147)

## 2013-05-27 LAB — MAGNESIUM: Magnesium: 2.1 mg/dL (ref 1.5–2.5)

## 2013-05-27 MED ORDER — ALBUTEROL SULFATE HFA 108 (90 BASE) MCG/ACT IN AERS: 2.0000 | INHALATION_SPRAY | Freq: Four times a day (QID) | RESPIRATORY_TRACT | Status: DC | PRN

## 2013-05-27 MED ORDER — POTASSIUM CHLORIDE CRYS ER 20 MEQ PO TBCR
40.0000 meq | EXTENDED_RELEASE_TABLET | Freq: Once | ORAL | Status: AC
  Administered 2013-05-27: 40 meq via ORAL
  Filled 2013-05-27: qty 2

## 2013-05-27 MED ORDER — LISINOPRIL 20 MG PO TABS: 20.0000 mg | ORAL_TABLET | Freq: Every day | ORAL | Status: DC

## 2013-05-27 MED ORDER — HYDROCODONE-ACETAMINOPHEN 5-325 MG PO TABS: 1.0000 | ORAL_TABLET | Freq: Four times a day (QID) | ORAL | Status: DC | PRN

## 2013-05-27 MED ORDER — POTASSIUM CHLORIDE CRYS ER 20 MEQ PO TBCR: 20.0000 meq | EXTENDED_RELEASE_TABLET | Freq: Every day | ORAL | Status: DC

## 2013-05-27 NOTE — Discharge Summary (Signed)
Physician Discharge Summary  Darlene Pierce SNK:[id number] DOB: Feb 11, 1966 DOA: 05/24/2013  PCP: No PCP Per Patient  Admit date: 05/24/2013 Discharge date: 05/27/2013  Time spent: 50* minutes  Recommendations for Outpatient Follow-up:  1. Follow up PCP in one week for BMP 2. Follow up cardiology in 2 weeks  Discharge Diagnoses:  Principal Problem:   Acute on chronic heart failure Active Problems:   Congestive heart failure   Lymphoma   Hypertension   Multiple thyroid nodules   Discharge Condition: *Stable  Diet recommendation: low salt diet  Filed Weights   05/25/13 0425 05/26/13 0500 05/27/13 0553  Weight: 111.585 kg (246 lb) 110.043 kg (242 lb 9.6 oz) 109.226 kg (240 lb 12.8 oz)    History of present illness:  48 y.o. female with Past medical history of Non-Hodgkin's lymphoma status post radiation in 2000 and chemotherapy in 2012, hypertension, thyroid nodule, history of admitted to [facility] years ago with respiratory failure and was on ventilator, history of coronary angiogram last year Without any PCI per patient.  The patient is coming from home.  The patient presents with complaints of shortness of breath on exertion that has been ongoing since last one week associated with orthopnea. And today she was not able to write down and sleep because of shortness of breath and therefore she decided to come to the hospital. She also mentions that she had an episode of chest pain which was felt like a dull 8 located on the left side and also she had some pain on her right upper shoulder which was worsening with movement of the head and associated with some numbness of her right hand started at the same time.  She currently is chest pain-free denies any shortness of breath. She mentions that she has improved after receiving the dose of Lasix.  She mentions that she has been taking Lasix since last many years due to fluid buildup.  She has history of coronary artery  disease in her mother in 55s.  She denies any cough or fever. Urinary symptoms or diarrhea or nausea or vomiting or abdominal pain.      Hospital Course:   1. Acute on chronic diastolic CHF- Patient was started on IV lasix and has responded well, no dyspnea at this time. 2d echo shows grade 1 diastolic dysfunction.systolic function is mildly reduced,  Cardiac enzymex x 3 are negative. Cardiology saw the patient and recommended to continue on Po lasix 40 mg daily. She is breathing better and will be discharged home. Will follow up Cardiology as outpatient. 2. Lymphoma- stable. 3. Hypertension- Will continue with lisinopril, and discontinue HCTZ as patient is also on lasix.  4. Muscle spasms- Secondary to hypokalemia, she has been on both lasix and HCTZ at home , causing hypokalemia. We have discontinued HCTZ, will continue lasix and lisinopril , will also give potassium supplement 20 meq po daily. She will need repeat BMP in one week     Procedures:  *Echocardiogram  Consultations:  cardiology  Discharge Exam: Filed Vitals:   05/27/13 0553  BP: 95/59  Pulse: 86  Temp: 98.3 F (36.8 C)  Resp:     General: *Appear in no acute distress Cardiovascular: *s1s2 RRR Respiratory: *Clear bilaterally  Discharge Instructions  Discharge Orders   Future Orders Complete By Expires   Diet - low sodium heart healthy  As directed    Discharge instructions  As directed    Comments:     Get your blood work  checked in one week for potassium levels and kidney function at your PCP office   Increase activity slowly  As directed        Medication List    STOP taking these medications       ibuprofen 800 MG tablet  Commonly known as:  ADVIL,MOTRIN     lisinopril-hydrochlorothiazide 20-25 MG per tablet  Commonly known as:  PRINZIDE,ZESTORETIC      TAKE these medications       albuterol 108 (90 BASE) MCG/ACT inhaler  Commonly known as:  PROVENTIL HFA;VENTOLIN HFA  Inhale 2 puffs  into the lungs every 6 (six) hours as needed for wheezing or shortness of breath.     furosemide 40 MG tablet  Commonly known as:  LASIX  Take 40 mg by mouth.     gabapentin 100 MG capsule  Commonly known as:  NEURONTIN  Take 100 mg by mouth daily.     HYDROcodone-acetaminophen 5-325 MG per tablet  Commonly known as:  NORCO  Take 1 tablet by mouth every 6 (six) hours as needed.     IRON FORMULA 27-400-100 MG-MCG-MCG Caps  Take 1 tablet by mouth daily.     lisinopril 20 MG tablet  Commonly known as:  PRINIVIL,ZESTRIL  Take 1 tablet (20 mg total) by mouth daily.     methocarbamol 500 MG tablet  Commonly known as:  ROBAXIN  Take 1 tablet (500 mg total) by mouth 2 (two) times daily.     potassium chloride SA 20 MEQ tablet  Commonly known as:  K-DUR,KLOR-CON  Take 1 tablet (20 mEq total) by mouth daily.     Vitamin D 2000 UNITS tablet  Take 2,000 Units by mouth daily.       No Known Allergies    The results of significant diagnostics from this hospitalization (including imaging, microbiology, ancillary and laboratory) are listed below for reference.    Significant Diagnostic Studies: Dg Chest 2 View  05/24/2013   CLINICAL DATA:  Shortness of breath.  EXAM: CHEST  2 VIEW  COMPARISON:  04/29/2013  FINDINGS: Left-sided Port-A-Cath is unchanged. Lungs are adequately inflated with minimal prominence of the right infrahilar markings which may be due to mild vascular congestion although cannot exclude early infection. Cardiomediastinal silhouette and remainder of the exam is unchanged.  IMPRESSION: Mild prominence of the right infrahilar markings which may be due to mild vascular congestion although cannot exclude early infection.   Electronically Signed   By: Marin Olp M.D.   On: 05/24/2013 20:22   Dg Chest 2 View  04/30/2013   CLINICAL DATA:  Upper back pain.  EXAM: CHEST  2 VIEW  COMPARISON:  None.  FINDINGS: The lungs are well-aerated and clear. There is no evidence of focal  opacification, pleural effusion or pneumothorax.  The heart is normal in size; the mediastinal contour is within normal limits. No acute osseous abnormalities are seen. Clips are noted within the right upper quadrant, reflecting prior cholecystectomy. A left-sided chest port is noted, ending about the mid SVC.  IMPRESSION: No acute cardiopulmonary process seen. No displaced rib fractures identified.   Electronically Signed   By: Garald Balding M.D.   On: 04/30/2013 00:11   Dg Cervical Spine Complete  04/30/2013   CLINICAL DATA:  Right-sided neck pain.  EXAM: CERVICAL SPINE  4+ VIEWS  COMPARISON:  None.  FINDINGS: There is no evidence of fracture or subluxation. Vertebral bodies demonstrate normal height and alignment. Minimal disc space narrowing is noted along  the lower cervical spine, with scattered anterior disc osteophyte complexes. Prevertebral soft tissues are within normal limits. The provided odontoid view demonstrates no significant abnormality.  The visualized lung apices are clear. A left-sided chest port is partially imaged.  IMPRESSION: No evidence of fracture or subluxation along the cervical spine.   Electronically Signed   By: Garald Balding M.D.   On: 04/30/2013 00:09   Dg Lumbar Spine Complete  04/30/2013   CLINICAL DATA:  Lower back pain for several days.  EXAM: LUMBAR SPINE - COMPLETE 4+ VIEW  COMPARISON:  None.  FINDINGS: There is no evidence of fracture or subluxation. Vertebral bodies demonstrate normal height and alignment. Intervertebral disc spaces are preserved. The visualized neural foramina are grossly unremarkable in appearance.  The visualized bowel gas pattern is unremarkable in appearance; air and stool are noted within the colon. The sacroiliac joints are within normal limits. Clips are noted within the right upper quadrant, reflecting prior cholecystectomy.  IMPRESSION: No evidence of fracture or subluxation along the lumbar spine.   Electronically Signed   By: Garald Balding M.D.   On: 04/30/2013 00:11   Dg Shoulder Right  05/25/2013   CLINICAL DATA:  Right shoulder pain  EXAM: RIGHT SHOULDER - 2+ VIEW  COMPARISON:  DG CHEST 2 VIEW dated 04/29/2013  FINDINGS: There is no fracture or dislocation. There are mild degenerative changes of the acromioclavicular joint. There are enthesopathic changes at the deltoid origin.  IMPRESSION: No acute osseous injury of the right shoulder.   Electronically Signed   By: Kathreen Devoid   On: 05/25/2013 19:39    Microbiology: No results found for this or any previous visit (from the past 240 hour(s)).   Labs: Basic Metabolic Panel:  Recent Labs Lab 05/24/13 2040 05/25/13 0450 05/25/13 2210 05/26/13 0540 05/27/13 0350  NA 142 141  --  140 137  K 3.7 3.2* 3.5* 3.8 3.9  CL 103 100  --  97 95*  CO2 27 27  --  32 27  GLUCOSE 103* 124*  --  112* 117*  BUN 18 17  --  20 25*  CREATININE 1.10 1.06  --  1.21* 1.15*  CALCIUM 9.1 8.9  --  9.3 8.8  MG  --   --  2.0  --  2.1   Liver Function Tests:  Recent Labs Lab 05/25/13 0450  AST 14  ALT 15  ALKPHOS 65  BILITOT 0.6  PROT 6.9  ALBUMIN 3.3*   No results found for this basename: LIPASE, AMYLASE,  in the last 168 hours No results found for this basename: AMMONIA,  in the last 168 hours CBC:  Recent Labs Lab 05/24/13 2040 05/25/13 0450 05/26/13 0540  WBC 9.3 9.3 7.9  NEUTROABS 6.4  --   --   HGB 11.8* 12.5 13.5  HCT 36.4 38.6 41.4  MCV 90.8 91.3 91.0  PLT 324 303 317   Cardiac Enzymes:  Recent Labs Lab 05/24/13 2040 05/25/13 0450 05/25/13 0935 05/25/13 1452  TROPONINI <0.30 <0.30 <0.30 <0.30   BNP: BNP (last 3 results)  Recent Labs  05/24/13 2040  PROBNP 2261.0*   CBG: No results found for this basename: GLUCAP,  in the last 168 hours     Signed:  Admire Bunnell S  [facility] 05/27/2013, 11:39 AM

## 2013-05-27 NOTE — Consult Note (Signed)
Outside records noted; normal coronaries. Darlene Pierce

## 2013-06-11 ENCOUNTER — Emergency Department (HOSPITAL_BASED_OUTPATIENT_CLINIC_OR_DEPARTMENT_OTHER): Payer: BC Managed Care – PPO

## 2013-06-11 ENCOUNTER — Emergency Department (HOSPITAL_BASED_OUTPATIENT_CLINIC_OR_DEPARTMENT_OTHER)
Admission: EM | Admit: 2013-06-11 | Discharge: 2013-06-11 | Disposition: A | Discharge status: Home / Self Care | Payer: BC Managed Care – PPO | Attending: Emergency Medicine | Admitting: Emergency Medicine

## 2013-06-11 ENCOUNTER — Encounter (HOSPITAL_BASED_OUTPATIENT_CLINIC_OR_DEPARTMENT_OTHER): Payer: Self-pay | Admitting: Emergency Medicine

## 2013-06-11 DIAGNOSIS — I509 Heart failure, unspecified: Secondary | ICD-10-CM

## 2013-06-11 DIAGNOSIS — I10 Essential (primary) hypertension: Secondary | ICD-10-CM | POA: Insufficient documentation

## 2013-06-11 DIAGNOSIS — Z8639 Personal history of other endocrine, nutritional and metabolic disease: Secondary | ICD-10-CM | POA: Insufficient documentation

## 2013-06-11 DIAGNOSIS — Z87898 Personal history of other specified conditions: Secondary | ICD-10-CM | POA: Insufficient documentation

## 2013-06-11 DIAGNOSIS — Z79899 Other long term (current) drug therapy: Secondary | ICD-10-CM | POA: Insufficient documentation

## 2013-06-11 DIAGNOSIS — R059 Cough, unspecified: Secondary | ICD-10-CM | POA: Insufficient documentation

## 2013-06-11 DIAGNOSIS — Z8709 Personal history of other diseases of the respiratory system: Secondary | ICD-10-CM | POA: Insufficient documentation

## 2013-06-11 DIAGNOSIS — I5032 Chronic diastolic (congestive) heart failure: Secondary | ICD-10-CM | POA: Insufficient documentation

## 2013-06-11 DIAGNOSIS — Z862 Personal history of diseases of the blood and blood-forming organs and certain disorders involving the immune mechanism: Secondary | ICD-10-CM | POA: Insufficient documentation

## 2013-06-11 DIAGNOSIS — R05 Cough: Secondary | ICD-10-CM | POA: Insufficient documentation

## 2013-06-11 LAB — CBC WITH DIFFERENTIAL/PLATELET
Basophils Absolute: 0 10*3/uL (ref 0.0–0.1)
Basophils Relative: 0 % (ref 0–1)
Eosinophils Absolute: 0.2 10*3/uL (ref 0.0–0.7)
Eosinophils Relative: 2 % (ref 0–5)
HEMATOCRIT: 39.1 % (ref 36.0–46.0)
Hemoglobin: 12.7 g/dL (ref 12.0–15.0)
LYMPHS ABS: 1.8 10*3/uL (ref 0.7–4.0)
LYMPHS PCT: 19 % (ref 12–46)
MCH: 29.5 pg (ref 26.0–34.0)
MCHC: 32.5 g/dL (ref 30.0–36.0)
MCV: 90.7 fL (ref 78.0–100.0)
Monocytes Absolute: 0.5 10*3/uL (ref 0.1–1.0)
Monocytes Relative: 6 % (ref 3–12)
Neutro Abs: 7.3 10*3/uL (ref 1.7–7.7)
Neutrophils Relative %: 74 % (ref 43–77)
PLATELETS: 295 10*3/uL (ref 150–400)
RBC: 4.31 MIL/uL (ref 3.87–5.11)
RDW: 13.1 % (ref 11.5–15.5)
WBC: 9.8 10*3/uL (ref 4.0–10.5)

## 2013-06-11 LAB — BASIC METABOLIC PANEL
BUN: 17 mg/dL (ref 6–23)
CALCIUM: 9.1 mg/dL (ref 8.4–10.5)
CO2: 26 mEq/L (ref 19–32)
Chloride: 103 mEq/L (ref 96–112)
Creatinine, Ser: 1.1 mg/dL (ref 0.50–1.10)
GFR calc Af Amer: 69 mL/min — ABNORMAL LOW (ref >90)
GFR calc non Af Amer: 59 mL/min — ABNORMAL LOW (ref >90)
Glucose, Bld: 101 mg/dL — ABNORMAL HIGH (ref 70–99)
POTASSIUM: 4.1 meq/L (ref 3.7–5.3)
SODIUM: 141 meq/L (ref 137–147)

## 2013-06-11 LAB — TROPONIN I: Troponin I: <0.3 ng/mL (ref <0.30)

## 2013-06-11 LAB — PRO B NATRIURETIC PEPTIDE: PRO B NATRI PEPTIDE: 2239 pg/mL — AB (ref 0–125)

## 2013-06-11 MED ORDER — FUROSEMIDE 40 MG PO TABS: 40.0000 mg | ORAL_TABLET | Freq: Two times a day (BID) | ORAL | Status: DC

## 2013-06-11 MED ORDER — FUROSEMIDE 10 MG/ML IJ SOLN
80.0000 mg | Freq: Once | INTRAMUSCULAR | Status: AC
  Administered 2013-06-11: 80 mg via INTRAVENOUS
  Filled 2013-06-11: qty 8

## 2013-06-11 MED ORDER — OXYCODONE-ACETAMINOPHEN 5-325 MG PO TABS
2.0000 | ORAL_TABLET | Freq: Once | ORAL | Status: AC
  Administered 2013-06-11: 2 via ORAL

## 2013-06-11 MED ORDER — OXYCODONE-ACETAMINOPHEN 5-325 MG PO TABS
ORAL_TABLET | ORAL | Status: DC
Start: 2013-06-11 — End: 2013-06-11
  Filled 2013-06-11: qty 2

## 2013-06-11 NOTE — ED Provider Notes (Signed)
CSN: [id number]     Arrival date & time 06/11/13  1315 History   First MD Initiated Contact with Patient 06/11/13 1335     Chief Complaint  Patient presents with  . Shortness of Breath    HPI Patient with past history of chronic diastolic heart failure presents with increasing shortness breath the last few days.  Recently admitted for congestive heart failure.  Has been taking her Lasix as directed.  No definite weight gain.  No fever chills hoarseness symptoms of influenza. Past Medical History  Diagnosis Date  . Lymphoma     s/p chemotherapy 2012, radiation in 2000  . Hypertension   . Multiple thyroid nodules   . Respiratory failure     was admitted in [facility] and was on ventilator  . H/O angiography     unsure of any PCI   Past Surgical History  Procedure Laterality Date  . Tubal ligation    . Femur surgery    . Knee surgery    . Cholecystectomy    . Porta cath insertion      2 years   Family History  Problem Relation Age of Onset  . CAD Mother    History  Substance Use Topics  . Smoking status: Never Smoker   . Smokeless tobacco: Never Used  . Alcohol Use: 0.6 oz/week    1 Glasses of wine per week     Comment: Occasional   OB History   Grav Para Term Preterm Abortions TAB SAB Ect Mult Living                 Review of Systems  Constitutional: Negative for fever and chills.  Respiratory: Positive for cough.   Cardiovascular: Negative for chest pain and leg swelling.  All other systems reviewed and are negative.    Allergies  Review of patient's allergies indicates no known allergies.  Home Medications   Current Outpatient Rx  Name  Route  Sig  Dispense  Refill  . albuterol (PROVENTIL HFA;VENTOLIN HFA) 108 (90 BASE) MCG/ACT inhaler   Inhalation   Inhale 2 puffs into the lungs every 6 (six) hours as needed for wheezing or shortness of breath.   1 Inhaler   2   . Cholecalciferol (VITAMIN D) 2000 UNITS tablet   Oral   Take 2,000 Units by  mouth daily.         . furosemide (LASIX) 40 MG tablet   Oral   Take 1 tablet (40 mg total) by mouth 2 (two) times daily.   30 tablet   0   . gabapentin (NEURONTIN) 100 MG capsule   Oral   Take 100 mg by mouth daily.         Marland Kitchen HYDROcodone-acetaminophen (NORCO) 5-325 MG per tablet   Oral   Take 1 tablet by mouth every 6 (six) hours as needed.   10 tablet   0   . Iron-Folic Acid-Vit T62 (IRON FORMULA) 27-400-100 MG-MCG-MCG CAPS   Oral   Take 1 tablet by mouth daily.         Marland Kitchen lisinopril (PRINIVIL,ZESTRIL) 20 MG tablet   Oral   Take 1 tablet (20 mg total) by mouth daily.   30 tablet   2   . methocarbamol (ROBAXIN) 500 MG tablet   Oral   Take 1 tablet (500 mg total) by mouth 2 (two) times daily.   20 tablet   0   . potassium chloride SA (K-DUR,KLOR-CON) 20 MEQ tablet  Oral   Take 1 tablet (20 mEq total) by mouth daily.   30 tablet   2    BP 153/104  Pulse 98  Temp(Src) 98.1 F (36.7 C) (Oral)  Resp 24  Wt 240 lb (108.863 kg)  SpO2 98% Physical Exam  Nursing note and vitals reviewed. Constitutional: She is oriented to person, place, and time. She appears well-developed and well-nourished. No distress.  HENT:  Head: Normocephalic and atraumatic.  Eyes: Pupils are equal, round, and reactive to light.  Neck: Normal range of motion.  Cardiovascular: Normal rate and intact distal pulses.   Pulmonary/Chest: No respiratory distress. She has no wheezes. She has rales (scatterred).  Abdominal: Normal appearance. She exhibits no distension.  Musculoskeletal: Normal range of motion.  Neurological: She is alert and oriented to person, place, and time. No cranial nerve deficit.  Skin: Skin is warm and dry. No rash noted.  Psychiatric: She has a normal mood and affect. Her behavior is normal.    ED Course  Procedures (including critical care time)  Medications  furosemide (LASIX) injection 80 mg (80 mg Intravenous Given 06/11/13 1415)  oxyCODONE-acetaminophen  (PERCOCET/ROXICET) 5-325 MG per tablet 2 tablet (2 tablets Oral Given 06/11/13 1510)    Labs Review Labs Reviewed  BASIC METABOLIC PANEL - Abnormal; Notable for the following:    Glucose, Bld 101 (*)    GFR calc non Af Amer 59 (*)    GFR calc Af Amer 69 (*)    All other components within normal limits  PRO B NATRIURETIC PEPTIDE - Abnormal; Notable for the following:    Pro B Natriuretic peptide (BNP) 2239.0 (*)    All other components within normal limits  CBC WITH DIFFERENTIAL  TROPONIN I   Imaging Review Dg Chest 2 View  06/11/2013   CLINICAL DATA:  Shortness of breath and chest pressure. History of lymphoma.  EXAM: CHEST - 2 VIEW  COMPARISON:  DG CHEST 2 VIEW dated 05/24/2013  FINDINGS: There is evidence of mild interstitial edema. Bibasilar atelectasis present. The heart is mildly enlarged. Porta cath shows stable positioning. No significant pleural fluid is identified.  IMPRESSION: Interstitial edema and cardiomegaly.   Electronically Signed   By: Aletta Edouard M.D.   On: 06/11/2013 14:53    EKG Interpretation    Date/Time:  Wednesday June 11 2013 13:24:57 EST Ventricular Rate:  112 PR Interval:  144 QRS Duration: 142 QT Interval:  382 QTC Calculation: 521 R Axis:   82 Text Interpretation:  Sinus tachycardia with Premature atrial complexes Possible Left atrial enlargement Left bundle branch block Abnormal ECG No significant change since last tracing Confirmed by Makensey Rego  MD, Ysabella Babiarz ([id number]) on 06/11/2013 1:44:08 PM           I spoke with cardiology and arranged for her to have appointment tomorrow.  Patient stable at discharge MDM   1. Chronic CHF        Dot Lanes, MD 06/11/13 2159

## 2013-06-11 NOTE — ED Notes (Signed)
Pt c/o SOb and chest pressure with exertion x 2 weeks, recent admit for same .

## 2013-06-11 NOTE — ED Notes (Signed)
DC was delayed until troponin resulted per vo Dr. Audie Pinto.  Trop was neg.  Pt DC'd at this time.

## 2013-06-11 NOTE — Discharge Instructions (Signed)
Heart Failure °Heart failure is a condition in which the heart has trouble pumping blood. This means your heart does not pump blood efficiently for your body to work well. In some cases of heart failure, fluid may back up into your lungs or you may have swelling (edema) in your lower legs. Heart failure is usually a long-term (chronic) condition. It is important for you to take good care of yourself and follow your caregiver's treatment plan. °CAUSES  °Some health conditions can cause heart failure. Those health conditions include: °· High blood pressure (hypertension) causes the heart muscle to work harder than normal. When pressure in the blood vessels is high, the heart needs to pump (contract) with more force in order to circulate blood throughout the body. High blood pressure eventually causes the heart to become stiff and weak. °· Coronary artery disease (CAD) is the buildup of cholesterol and fat (plaque) in the arteries of the heart. The blockage in the arteries deprives the heart muscle of oxygen and blood. This can cause chest pain and may lead to a heart attack. High blood pressure can also contribute to CAD. °· Heart attack (myocardial infarction) occurs when 1 or more arteries in the heart become blocked. The loss of oxygen damages the muscle tissue of the heart. When this happens, part of the heart muscle dies. The injured tissue does not contract as well and weakens the heart's ability to pump blood. °· Abnormal heart valves can cause heart failure when the heart valves do not open and close properly. This makes the heart muscle pump harder to keep the blood flowing. °· Heart muscle disease (cardiomyopathy or myocarditis) is damage to the heart muscle from a variety of causes. These can include drug or alcohol abuse, infections, or unknown reasons. These can increase the risk of heart failure. °· Lung disease makes the heart work harder because the lungs do not work properly. This can cause a strain  on the heart, leading it to fail. °· Diabetes increases the risk of heart failure. High blood sugar contributes to high fat (lipid) levels in the blood. Diabetes can also cause slow damage to tiny blood vessels that carry important nutrients to the heart muscle. When the heart does not get enough oxygen and food, it can cause the heart to become weak and stiff. This leads to a heart that does not contract efficiently. °· Other conditions can contribute to heart failure. These include abnormal heart rhythms, thyroid problems, and low blood counts (anemia). °Certain unhealthy behaviors can increase the risk of heart failure. Those unhealthy behaviors include: °· Being overweight. °· Smoking or chewing tobacco. °· Eating foods high in fat and cholesterol. °· Abusing illicit drugs or alcohol. °· Lacking physical activity. °SYMPTOMS  °Heart failure symptoms may vary and can be hard to detect. Symptoms may include: °· Shortness of breath with activity, such as climbing stairs. °· Persistent cough. °· Swelling of the feet, ankles, legs, or abdomen. °· Unexplained weight gain. °· Difficulty breathing when lying flat (orthopnea). °· Waking from sleep because of the need to sit up and get more air. °· Rapid heartbeat. °· Fatigue and loss of energy. °· Feeling lightheaded, dizzy, or close to fainting. °· Loss of appetite. °· Nausea. °· Increased urination during the night (nocturia). °DIAGNOSIS  °A diagnosis of heart failure is based on your history, symptoms, physical examination, and diagnostic tests. °Diagnostic tests for heart failure may include: °· Echocardiography. °· Electrocardiography. °· Chest X-ray. °· Blood tests. °· Exercise   stress test. °· Cardiac angiography. °· Radionuclide scans. °TREATMENT  °Treatment is aimed at managing the symptoms of heart failure. Medicines, behavioral changes, or surgical intervention may be necessary to treat heart failure. °· Medicines to help treat heart failure may  include: °· Angiotensin-converting enzyme (ACE) inhibitors. This type of medicine blocks the effects of a blood protein called angiotensin-converting enzyme. ACE inhibitors relax (dilate) the blood vessels and help lower blood pressure. °· Angiotensin receptor blockers. This type of medicine blocks the actions of a blood protein called angiotensin. Angiotensin receptor blockers dilate the blood vessels and help lower blood pressure. °· Water pills (diuretics). Diuretics cause the kidneys to remove salt and water from the blood. The extra fluid is removed through urination. This loss of extra fluid lowers the volume of blood the heart pumps. °· Beta blockers. These prevent the heart from beating too fast and improve heart muscle strength. °· Digitalis. This increases the force of the heartbeat. °· Healthy behavior changes include: °· Obtaining and maintaining a healthy weight. °· Stopping smoking or chewing tobacco. °· Eating heart healthy foods. °· Limiting or avoiding alcohol. °· Stopping illicit drug use. °· Physical activity as directed by your caregiver. °· Surgical treatment for heart failure may include: °· A procedure to open blocked arteries, repair damaged heart valves, or remove damaged heart muscle tissue. °· A pacemaker to improve heart muscle function and control certain abnormal heart rhythms. °· An internal cardioverter defibrillator to treat certain serious abnormal heart rhythms. °· A left ventricular assist device to assist the pumping ability of the heart. °HOME CARE INSTRUCTIONS  °· Take your medicine as directed by your caregiver. Medicines are important in reducing the workload of your heart, slowing the progression of heart failure, and improving your symptoms. °· Do not stop taking your medicine unless directed by your caregiver. °· Do not skip any dose of medicine. °· Refill your prescriptions before you run out of medicine. Your medicines are needed every day. °· Take over-the-counter  medicine only as directed by your caregiver or pharmacist. °· Engage in moderate physical activity if directed by your caregiver. Moderate physical activity can benefit some people. The elderly and people with severe heart failure should consult with a caregiver for physical activity recommendations. °· Eat heart healthy foods. Food choices should be free of trans fat and low in saturated fat, cholesterol, and salt (sodium). Healthy choices include fresh or frozen fruits and vegetables, fish, lean meats, legumes, fat-free or low-fat dairy products, and whole grain or high fiber foods. Talk to a dietitian to learn more about heart healthy foods. °· Limit sodium if directed by your caregiver. Sodium restriction may reduce symptoms of heart failure in some people. Talk to a dietitian to learn more about heart healthy seasonings. °· Use healthy cooking methods. Healthy cooking methods include roasting, grilling, broiling, baking, poaching, steaming, or stir-frying. Talk to a dietitian to learn more about healthy cooking methods. °· Limit fluids if directed by your caregiver. Fluid restriction may reduce symptoms of heart failure in some people. °· Weigh yourself every day. Daily weights are important in the early recognition of excess fluid. You should weigh yourself every morning after you urinate and before you eat breakfast. Wear the same amount of clothing each time you weigh yourself. Record your daily weight. Provide your caregiver with your weight record. °· Monitor and record your blood pressure if directed by your caregiver. °· Check your pulse if directed by your caregiver. °· Lose weight if directed   by your caregiver. Weight loss may reduce symptoms of heart failure in some people. °· Stop smoking or chewing tobacco. Nicotine makes your heart work harder by causing your blood vessels to constrict. Do not use nicotine gum or patches before talking to your caregiver. °· Schedule and attend follow-up visits as  directed by your caregiver. It is important to keep all your appointments. °· Limit alcohol intake to no more than 1 drink per day for nonpregnant women and 2 drinks per day for men. Drinking more than that is harmful to your heart. Tell your caregiver if you drink alcohol several times a week. Talk with your caregiver about whether alcohol is safe for you. If your heart has already been damaged by alcohol or you have severe heart failure, drinking alcohol should be stopped completely. °· Stop illicit drug use. °· Stay up-to-date with immunizations. It is especially important to prevent respiratory infections through current pneumococcal and influenza immunizations. °· Manage other health conditions such as hypertension, diabetes, thyroid disease, or abnormal heart rhythms as directed by your caregiver. °· Learn to manage stress. °· Plan rest periods when fatigued. °· Learn strategies to manage high temperatures. If the weather is extremely hot: °· Avoid vigorous physical activity. °· Use air conditioning or fans or seek a cooler location. °· Avoid caffeine and alcohol. °· Wear loose-fitting, lightweight, and light-colored clothing. °· Learn strategies to manage cold temperatures. If the weather is extremely cold: °· Avoid vigorous physical activity. °· Layer clothes. °· Wear mittens or gloves, a hat, and a scarf when going outside. °· Avoid alcohol. °· Obtain ongoing education and support as needed. °· Participate or seek rehabilitation as needed to maintain or improve independence and quality of life. °SEEK MEDICAL CARE IF:  °· Your weight increases by 03 lb/1.4 kg in 1 day or 05 lb/2.3 kg in a week. °· You have increasing shortness of breath that is unusual for you. °· You are unable to participate in your usual physical activities. °· You tire easily. °· You cough more than normal, especially with physical activity. °· You have any or more swelling in areas such as your hands, feet, ankles, or abdomen. °· You  are unable to sleep because it is hard to breathe. °· You feel like your heart is beating fast (palpitations). °· You become dizzy or lightheaded upon standing up. °SEEK IMMEDIATE MEDICAL CARE IF:  °· You have difficulty breathing. °· There is a change in mental status such as decreased alertness or difficulty with concentration. °· You have a pain or discomfort in your chest. °· You have an episode of fainting (syncope). °MAKE SURE YOU:  °· Understand these instructions. °· Will watch your condition. °· Will get help right away if you are not doing well or get worse. °Document Released: 05/08/2005 Document Revised: 09/02/2012 Document Reviewed: 05/30/2012 °ExitCare® Patient Information ©2014 [employer]. ° °

## 2013-06-12 ENCOUNTER — Ambulatory Visit (INDEPENDENT_AMBULATORY_CARE_PROVIDER_SITE_OTHER): Payer: BC Managed Care – PPO | Admitting: Physician Assistant

## 2013-06-12 ENCOUNTER — Encounter: Payer: Self-pay | Admitting: Physician Assistant

## 2013-06-12 VITALS — BP 140/100 | HR 115 | Ht 66.5 in | Wt 249.4 lb

## 2013-06-12 DIAGNOSIS — I10 Essential (primary) hypertension: Secondary | ICD-10-CM

## 2013-06-12 DIAGNOSIS — I428 Other cardiomyopathies: Secondary | ICD-10-CM

## 2013-06-12 DIAGNOSIS — I5042 Chronic combined systolic (congestive) and diastolic (congestive) heart failure: Secondary | ICD-10-CM

## 2013-06-12 DIAGNOSIS — I5043 Acute on chronic combined systolic (congestive) and diastolic (congestive) heart failure: Secondary | ICD-10-CM

## 2013-06-12 MED ORDER — POTASSIUM CHLORIDE CRYS ER 20 MEQ PO TBCR: 20.0000 meq | EXTENDED_RELEASE_TABLET | Freq: Two times a day (BID) | ORAL | Status: DC

## 2013-06-12 MED ORDER — FUROSEMIDE 80 MG PO TABS: 80.0000 mg | ORAL_TABLET | Freq: Two times a day (BID) | ORAL | Status: DC

## 2013-06-12 NOTE — Progress Notes (Signed)
[place], [address], [place]  [address] Phone: [phone] Fax:  [phone]  Date:  06/12/2013   ID:  Darlene Pierce, DOB [birth year], MRN [id number]  PCP:  No PCP Per Patient  Cardiologist:  Dr. Kirk Ruths     History of Present Illness: Darlene Pierce is a 48 y.o. female with a hx of HTN, non-Hodgkin's Lymphoma (radiation in 2000, chemotherapy in 2012), CHF.  She was admitted 1/3-1/6 with a/c combined systolic and diastolic CHF.  She presented with symptoms of dyspnea and chest pain.  CEs were negative.  Echo (05/26/2013):  Mild LVH, EF 45-50%, Gr 1 DD, mild LAE, RVSP 27.   She was diuresed.  She was seen by Dr. Stanford Breed.  Records obtained from [facility]:  LHC (12/08/2011):  EF 35%, normal coronary arteries.  No further cardiac workup pursued.  She was seen again in the ED yesterday with evidence of volume excess.  She was given one dose of Lasix IV 80 mg and set up for follow up today.    Patient continues to note dyspnea with exertion. She describes NYHA class III symptoms. Her dyspnea has improved just slightly with IV Lasix yesterday. She notes 2 pillow orthopnea as well as PND. She has slight LE edema. She does note increased abdominal girth. She does note chest tightness that has improved with IV Lasix yesterday. She denies syncope. She has a nonproductive cough.   Recent Labs: 05/25/2013: ALT 15; TSH 1.649  06/11/2013: Creatinine 1.10; Hemoglobin 12.7; Potassium 4.1; Pro B Natriuretic peptide (BNP) 2239.0*   CXR (06/11/2013): FINDINGS: There is evidence of mild interstitial edema. Bibasilar atelectasis present. The heart is mildly enlarged. Porta cath shows stable positioning. No significant pleural fluid is identified.  IMPRESSION: Interstitial edema and cardiomegaly.    Wt Readings from Last 3 Encounters:  06/12/13 249 lb 6.4 oz (113.127 kg)  06/11/13 240 lb (108.863 kg)  05/27/13 240 lb 12.8 oz (109.226 kg)     Past Medical History  Diagnosis Date  .  Lymphoma     s/p chemotherapy 2012, radiation in 2000  . Hypertension   . Multiple thyroid nodules   . Respiratory failure     was admitted in [facility] and was on ventilator  . H/O angiography     unsure of any PCI    Current Outpatient Prescriptions  Medication Sig Dispense Refill  . albuterol (PROVENTIL HFA;VENTOLIN HFA) 108 (90 BASE) MCG/ACT inhaler Inhale 2 puffs into the lungs every 6 (six) hours as needed for wheezing or shortness of breath.  1 Inhaler  2  . Cholecalciferol (VITAMIN D) 2000 UNITS tablet Take 2,000 Units by mouth daily.      . furosemide (LASIX) 40 MG tablet Take 1 tablet (40 mg total) by mouth 2 (two) times daily.  30 tablet  0  . gabapentin (NEURONTIN) 100 MG capsule Take 100 mg by mouth daily.      Marland Kitchen HYDROcodone-acetaminophen (NORCO) 5-325 MG per tablet Take 1 tablet by mouth every 6 (six) hours as needed.  10 tablet  0  . Iron-Folic Acid-Vit B09 (IRON FORMULA) 27-400-100 MG-MCG-MCG CAPS Take 1 tablet by mouth daily.      Marland Kitchen lisinopril (PRINIVIL,ZESTRIL) 20 MG tablet Take 1 tablet (20 mg total) by mouth daily.  30 tablet  2  . methocarbamol (ROBAXIN) 500 MG tablet Take 1 tablet (500 mg total) by mouth 2 (two) times daily.  20 tablet  0  . oxyCODONE-acetaminophen (PERCOCET/ROXICET) 5-325 MG per tablet  Take 1 tablet by mouth every 4 (four) hours as needed for severe pain.      . potassium chloride SA (K-DUR,KLOR-CON) 20 MEQ tablet Take 1 tablet (20 mEq total) by mouth daily.  30 tablet  2   No current facility-administered medications for this visit.    Allergies:   Review of patient's allergies indicates no known allergies.   Social History:  The patient  reports that she has never smoked. She has never used smokeless tobacco. She reports that she drinks about 0.6 ounces of alcohol per week. She reports that she does not use illicit drugs.   Family History:  The patient's family history includes CAD in her mother.   ROS:  Please see the history of  present illness.      All other systems reviewed and negative.   PHYSICAL EXAM: VS:  BP 140/100  Pulse 115  Ht 5' 6.5" (1.689 m)  Wt 294 lb (133.358 kg)  BMI 46.75 kg/m2 Well nourished, well developed, in no acute distress HEENT: normal Neck:  JVDdifficult to assess Cardiac:  normal S1, S2; RRR; no murmur Lungs:  Decreased breath sounds bilaterally, no wheezing, no rales Abd: somewhat distended, nontender Ext: no edema Skin: warm and dry Neuro:  CNs 2-12 intact, no focal abnormalities noted  EKG:  Sinus tachycardia, HR 115, LBBB, no change from prior tracing     ASSESSMENT AND PLAN:  1. Acute on Chronic Combined Systolic and Diastolic CHF:  She remains volume overloaded. Increase Lasix to 80 mg twice a day. Increase potassium to 20 mEq twice a day. Check a repeat basic metabolic panel and BNP in several days. She knows to contact us if her symptoms are not improving. Could consider giving her one dose of Metolazone at that point.  If she feels worse, she knows to go to the emergency room at [facility]. She has follow up next week with Dr. Stanford Breed. She should keep that appointment.  I will give her info on low Na diet.  2. Non-Ischemic Cardiomyopathy:  Adjust diuretics as noted. Continue ACEI.  Consider adding Toprol at follow up if volume status improved.   3. Hypertension:  Uncontrolled.  Adjust Lasix first.  Try to start beta blocker in follow up.  4. Disposition:  Keep f/u with Dr. Kirk Ruths 1/30.    Signed, Richardson Dopp, PA-C  06/12/2013 2:50 PM

## 2013-06-12 NOTE — Patient Instructions (Addendum)
INCREASE LASIX TO 80 MG TWICE DAILY;START TODAY; NEW RX SENT IN TODAY TO [facility] IN [place] INCREASE POTASSIUM TO 20  MEQ TWICE DAILY;START TODAY;  NEW RX SENT IN TO [facility] IN [place]  LAB WORK 06/16/13 BMET, BNP  CALL IF YOUR SHORTNESS OF BREATH IS NOT BETTER OR IS WORSE [phone]  MAKE SURE TO KEEP YOUR FOLLOW UP WITH DR. CRENSHAW 06/20/13  2 Gram Low Sodium Diet A 2 gram sodium diet restricts the amount of sodium in the diet to no more than 2 g or 2000 mg daily. Limiting the amount of sodium is often used to help lower blood pressure. It is important if you have heart, liver, or kidney problems. Many foods contain sodium for flavor and sometimes as a preservative. When the amount of sodium in a diet needs to be low, it is important to know what to look for when choosing foods and drinks. The following includes some information and guidelines to help make it easier for you to adapt to a low sodium diet. QUICK TIPS  Do not add salt to food.  Avoid convenience items and fast food.  Choose unsalted snack foods.  Buy lower sodium products, often labeled as "lower sodium" or "no salt added."  Check food labels to learn how much sodium is in 1 serving.  When eating at a restaurant, ask that your food be prepared with less salt or none, if possible. READING FOOD LABELS FOR SODIUM INFORMATION The nutrition facts label is a good place to find how much sodium is in foods. Look for products with no more than 500 to 600 mg of sodium per meal and no more than 150 mg per serving. Remember that 2 g = 2000 mg. The food label may also list foods as:  Sodium-free: Less than 5 mg in a serving.  Very low sodium: 35 mg or less in a serving.  Low-sodium: 140 mg or less in a serving.  Light in sodium: 50% less sodium in a serving. For example, if a food that usually has 300 mg of sodium is changed to become light in sodium, it will have 150 mg of sodium.  Reduced sodium: 25% less sodium in a  serving. For example, if a food that usually has 400 mg of sodium is changed to reduced sodium, it will have 300 mg of sodium. CHOOSING FOODS Grains  Avoid: Salted crackers and snack items. Some cereals, including instant hot cereals. Bread stuffing and biscuit mixes. Seasoned rice or pasta mixes.  Choose: Unsalted snack items. Low-sodium cereals, oats, puffed wheat and rice, shredded wheat. English muffins and bread. Pasta. Meats  Avoid: Salted, canned, smoked, spiced, pickled meats, including fish and poultry. Bacon, ham, sausage, cold cuts, hot dogs, anchovies.  Choose: Low-sodium canned tuna and salmon. Fresh or frozen meat, poultry, and fish. Dairy  Avoid: Processed cheese and spreads. Cottage cheese. Buttermilk and condensed milk. Regular cheese.  Choose: Milk. Low-sodium cottage cheese. Yogurt. Sour cream. Low-sodium cheese. Fruits and Vegetables  Avoid: Regular canned vegetables. Regular canned tomato sauce and paste. Frozen vegetables in sauces. Olives. Angie Fava. Relishes. Sauerkraut.  Choose: Low-sodium canned vegetables. Low-sodium tomato sauce and paste. Frozen or fresh vegetables. Fresh and frozen fruit. Condiments  Avoid: Canned and packaged gravies. Worcestershire sauce. Tartar sauce. Barbecue sauce. Soy sauce. Steak sauce. Ketchup. Onion, garlic, and table salt. Meat flavorings and tenderizers.  Choose: Fresh and dried herbs and spices. Low-sodium varieties of mustard and ketchup. Lemon juice. Tabasco sauce. Horseradish. SAMPLE 2  GRAM SODIUM MEAL PLAN Breakfast / Sodium (mg)  1 cup low-fat milk / 678 mg  2 slices whole-wheat toast / 270 mg  1 tbs heart-healthy margarine / 153 mg  1 hard-boiled egg / 139 mg  1 small orange / 0 mg Lunch / Sodium (mg)  1 cup raw carrots / 76 mg   cup hummus / 298 mg  1 cup low-fat milk / 143 mg   cup red grapes / 2 mg  1 whole-wheat pita bread / 356 mg Dinner / Sodium (mg)  1 cup whole-wheat pasta / 2 mg  1 cup  low-sodium tomato sauce / 73 mg  3 oz lean ground beef / 57 mg  1 small side salad (1 cup raw spinach leaves,  cup cucumber,  cup yellow bell pepper) with 1 tsp olive oil and 1 tsp red wine vinegar / 25 mg Snack / Sodium (mg)  1 container low-fat vanilla yogurt / 107 mg  3 graham cracker squares / 127 mg Nutrient Analysis  Calories: 2033  Protein: 77 g  Carbohydrate: 282 g  Fat: 72 g  Sodium: 1971 mg Document Released: 05/08/2005 Document Revised: 07/31/2011 Document Reviewed: 08/09/2009 ExitCare Patient Information 2014 [place], [place].

## 2013-06-16 ENCOUNTER — Other Ambulatory Visit: Payer: BC Managed Care – PPO

## 2013-06-18 ENCOUNTER — Encounter: Payer: Self-pay | Admitting: Cardiology

## 2013-06-20 ENCOUNTER — Encounter: Payer: Self-pay | Admitting: Cardiology

## 2013-06-20 ENCOUNTER — Ambulatory Visit (INDEPENDENT_AMBULATORY_CARE_PROVIDER_SITE_OTHER): Payer: BC Managed Care – PPO | Admitting: Cardiology

## 2013-06-20 VITALS — BP 134/100 | HR 96 | Ht 66.5 in | Wt 248.4 lb

## 2013-06-20 DIAGNOSIS — I10 Essential (primary) hypertension: Secondary | ICD-10-CM

## 2013-06-20 DIAGNOSIS — R0989 Other specified symptoms and signs involving the circulatory and respiratory systems: Secondary | ICD-10-CM

## 2013-06-20 DIAGNOSIS — R0600 Dyspnea, unspecified: Secondary | ICD-10-CM

## 2013-06-20 DIAGNOSIS — R0609 Other forms of dyspnea: Secondary | ICD-10-CM

## 2013-06-20 LAB — BASIC METABOLIC PANEL
BUN: 15 mg/dL (ref 6–23)
CHLORIDE: 103 meq/L (ref 96–112)
CO2: 29 mEq/L (ref 19–32)
Calcium: 9 mg/dL (ref 8.4–10.5)
Creatinine, Ser: 1 mg/dL (ref 0.4–1.2)
GFR: 64.75 mL/min (ref >60.00)
Glucose, Bld: 87 mg/dL (ref 70–99)
POTASSIUM: 3.7 meq/L (ref 3.5–5.1)
Sodium: 139 mEq/L (ref 135–145)

## 2013-06-20 LAB — BRAIN NATRIURETIC PEPTIDE: PRO B NATRI PEPTIDE: 258 pg/mL — AB (ref 0.0–100.0)

## 2013-06-20 MED ORDER — LISINOPRIL 40 MG PO TABS: 40.0000 mg | ORAL_TABLET | Freq: Every day | ORAL | Status: DC

## 2013-06-20 MED ORDER — METOPROLOL SUCCINATE ER 50 MG PO TB24: 50.0000 mg | ORAL_TABLET | Freq: Every day | ORAL | Status: DC

## 2013-06-20 NOTE — Progress Notes (Signed)
HPI: FU CHF; hx of HTN, non-Hodgkin's Lymphoma (radiation in 2000, chemotherapy in 2012). Records obtained from [facility]: LHC (12/08/2011): EF 35%, normal coronary arteries. She was admitted 1/15 with a/c combined systolic and diastolic CHF. CEs were negative. Echo (05/26/2013): Mild LVH, EF 45-50%, Gr 1 DD, mild LAE, RVSP 27. She was diuresed. TSH normal. Since she was last seen she has dyspnea on exertion. No orthopnea or PND. Occasional pedal edema. No chest pain.   Current Outpatient Prescriptions  Medication Sig Dispense Refill  . albuterol (PROVENTIL HFA;VENTOLIN HFA) 108 (90 BASE) MCG/ACT inhaler Inhale 2 puffs into the lungs every 6 (six) hours as needed for wheezing or shortness of breath.  1 Inhaler  2  . Cholecalciferol (VITAMIN D) 2000 UNITS tablet Take 2,000 Units by mouth daily.      Marland Kitchen gabapentin (NEURONTIN) 100 MG capsule Take 100 mg by mouth daily.      Marland Kitchen HYDROcodone-acetaminophen (NORCO) 5-325 MG per tablet Take 1 tablet by mouth every 6 (six) hours as needed.  10 tablet  0  . Iron-Folic Acid-Vit X90 (IRON FORMULA) 27-400-100 MG-MCG-MCG CAPS Take 1 tablet by mouth daily.      Marland Kitchen lisinopril (PRINIVIL,ZESTRIL) 20 MG tablet Take 1 tablet (20 mg total) by mouth daily.  30 tablet  2  . methocarbamol (ROBAXIN) 500 MG tablet Take 1 tablet (500 mg total) by mouth 2 (two) times daily.  20 tablet  0  . oxyCODONE-acetaminophen (PERCOCET/ROXICET) 5-325 MG per tablet Take 1 tablet by mouth every 4 (four) hours as needed for severe pain.      . potassium chloride SA (K-DUR,KLOR-CON) 20 MEQ tablet Take 1 tablet (20 mEq total) by mouth 2 (two) times daily.  60 tablet  11  . furosemide (LASIX) 80 MG tablet Take 1 tablet (80 mg total) by mouth 2 (two) times daily.  60 tablet  11   No current facility-administered medications for this visit.     Past Medical History  Diagnosis Date  . Lymphoma     s/p chemotherapy 2012, radiation in 2000  . Hypertension   . Multiple thyroid nodules   .  Respiratory failure     was admitted in [facility] and was on ventilator  . H/O angiography     unsure of any PCI    Past Surgical History  Procedure Laterality Date  . Tubal ligation    . Femur surgery    . Knee surgery    . Cholecystectomy    . Porta cath insertion      2 years    History   Social History  . Marital Status: Single    Spouse Name: N/A    Number of Children: 4  . Years of Education: N/A   Occupational History  .     Social History Main Topics  . Smoking status: Never Smoker   . Smokeless tobacco: Never Used  . Alcohol Use: 0.6 oz/week    1 Glasses of wine per week     Comment: Occasional  . Drug Use: No  . Sexual Activity: Not on file   Other Topics Concern  . Not on file   Social History Narrative  . No narrative on file    ROS: no fevers or chills, productive cough, hemoptysis, dysphasia, odynophagia, melena, hematochezia, dysuria, hematuria, rash, seizure activity, orthopnea, PND, pedal edema, claudication. Remaining systems are negative.  Physical Exam: Well-developed obese in no acute distress.  Skin is warm and dry.  HEENT is normal.  Neck is supple.  Chest is clear to auscultation with normal expansion.  Cardiovascular exam is regular rate and rhythm.  Abdominal exam nontender or distended. No masses palpated. Extremities show no edema. neuro grossly intact

## 2013-06-20 NOTE — Patient Instructions (Signed)
Your physician recommends that you schedule a follow-up appointment in: [facility]  START METOPROLOL SUCC ER 50 MG ONCE DAILY  INCREASE LISINOPRIL TO 40 MG ONCE DAILY  Your physician recommends that you HAVE LAB WORK TODAY

## 2013-06-20 NOTE — Assessment & Plan Note (Signed)
Blood pressure remains elevated.increase lisinopril to 40 mg daily. Add Toprol 50 mg daily. Check potassium and renal function today. Continue to increase Toprol as needed. She will most likely need additional medications as well such as spironolactone, amlodipine or hydralazine nitrates.

## 2013-06-20 NOTE — Assessment & Plan Note (Signed)
Patient continues to have dyspnea. Continue Lasix at present dose. Check potassium and renal function. We will most likely add spironolactone in the future. I think her symptoms will improve as we control her blood pressure as well.

## 2013-06-20 NOTE — Assessment & Plan Note (Signed)
Patient with nonischemic cardiomyopathy. This is most likely related to hypertension. Previous chemotherapy could also play a role. Continue lisinopril but increase to 40 mg daily. Add Toprol 50 mg daily. Continue to titrate medications as tolerated.

## 2013-06-23 ENCOUNTER — Other Ambulatory Visit: Payer: Self-pay

## 2013-06-23 DIAGNOSIS — I5042 Chronic combined systolic (congestive) and diastolic (congestive) heart failure: Secondary | ICD-10-CM

## 2013-06-23 DIAGNOSIS — I428 Other cardiomyopathies: Secondary | ICD-10-CM

## 2013-06-23 DIAGNOSIS — I10 Essential (primary) hypertension: Secondary | ICD-10-CM

## 2013-06-23 MED ORDER — FUROSEMIDE 80 MG PO TABS: 80.0000 mg | ORAL_TABLET | Freq: Two times a day (BID) | ORAL | Status: DC

## 2013-06-27 ENCOUNTER — Ambulatory Visit (INDEPENDENT_AMBULATORY_CARE_PROVIDER_SITE_OTHER): Payer: BC Managed Care – PPO | Admitting: Physician Assistant

## 2013-06-27 ENCOUNTER — Encounter: Payer: Self-pay | Admitting: Physician Assistant

## 2013-06-27 VITALS — BP 126/65 | HR 65 | Ht 66.5 in | Wt 242.0 lb

## 2013-06-27 DIAGNOSIS — I428 Other cardiomyopathies: Secondary | ICD-10-CM

## 2013-06-27 DIAGNOSIS — I10 Essential (primary) hypertension: Secondary | ICD-10-CM

## 2013-06-27 DIAGNOSIS — R5383 Other fatigue: Secondary | ICD-10-CM

## 2013-06-27 DIAGNOSIS — R5381 Other malaise: Secondary | ICD-10-CM

## 2013-06-27 DIAGNOSIS — R0602 Shortness of breath: Secondary | ICD-10-CM

## 2013-06-27 DIAGNOSIS — I5042 Chronic combined systolic (congestive) and diastolic (congestive) heart failure: Secondary | ICD-10-CM

## 2013-06-27 LAB — BRAIN NATRIURETIC PEPTIDE: Pro B Natriuretic peptide (BNP): 104 pg/mL — ABNORMAL HIGH (ref 0.0–100.0)

## 2013-06-27 MED ORDER — FUROSEMIDE 80 MG PO TABS: 80.0000 mg | ORAL_TABLET | Freq: Every day | ORAL | Status: DC

## 2013-06-27 NOTE — Patient Instructions (Addendum)
LAB WORK TODAY; BMET, BNP  Your physician has recommended that you have a sleep study. This test records several body functions during sleep, including: brain activity, eye movement, oxygen and carbon dioxide blood levels, heart rate and rhythm, breathing rate and rhythm, the flow of air through your mouth and nose, snoring, body muscle movements, and chest and belly movement.  Your physician recommends that you schedule a follow-up appointment in: [place] [place] THE OFFICE

## 2013-06-27 NOTE — Progress Notes (Signed)
[place], [address], [place]  [address] Phone: [phone] Fax:  [phone]  Date:  06/27/2013   ID:  Darlene Pierce, DOB 10-26-1965, MRN [id number]  PCP:  No PCP Per Patient  Cardiologist:  Dr. Kirk Ruths     History of Present Illness: Darlene Pierce is a 48 y.o. female with a hx of HTN, non-Hodgkin's Lymphoma (radiation in 2000, chemotherapy in 2012), CHF.  She was admitted 1/3-1/6 with a/c combined systolic and diastolic CHF.  Echo (05/26/2013):  Mild LVH, EF 45-50%, Gr 1 DD, mild LAE, RVSP 27.   Records from [facility]:  LHC (12/08/2011):  EF 35%, normal coronary arteries.    I saw her 1/22 after a recent trip to the ED with volume excess.  She received IV Lasix in the ED.  She was still volume overloaded when I saw her and I adjusted her Lasix.  She saw Dr. Kirk Ruths in follow up 1/30.  BP was uncontrolled.  ACEI was increased and she was placed on Toprol 50 QD.  She returns for follow up.  Her breathing is marginally improved. Her blood pressure is much improved. She feels tired. She denies lightheadedness, near syncope or syncope. She sleeps on 2 pillows chronically. She notes occasional awakenings in the middle of the night with dyspnea. She denies pedal edema. She denies chest pain. She denies snoring but does admit to daytime hypersomnolence. Her boyfriend has witnessed apneic episodes.  Recent Labs: 05/25/2013: ALT 15; TSH 1.649  06/11/2013: Hemoglobin 12.7  06/20/2013: Creatinine 1.0; Potassium 3.7; Pro B Natriuretic peptide (BNP) 258.0*    Wt Readings from Last 3 Encounters:  06/27/13 242 lb (109.77 kg)  06/20/13 248 lb 6.4 oz (112.674 kg)  06/12/13 249 lb 6.4 oz (113.127 kg)     Past Medical History  Diagnosis Date  . Lymphoma     s/p chemotherapy 2012, radiation in 2000  . Hypertension   . Multiple thyroid nodules   . Respiratory failure     was admitted in [facility] and was on ventilator  . H/O angiography     unsure of any PCI     Current Outpatient Prescriptions  Medication Sig Dispense Refill  . albuterol (PROVENTIL HFA;VENTOLIN HFA) 108 (90 BASE) MCG/ACT inhaler Inhale 2 puffs into the lungs every 6 (six) hours as needed for wheezing or shortness of breath.  1 Inhaler  2  . Cholecalciferol (VITAMIN D) 2000 UNITS tablet Take 2,000 Units by mouth daily.      Marland Kitchen gabapentin (NEURONTIN) 100 MG capsule Take 100 mg by mouth daily.      Marland Kitchen HYDROcodone-acetaminophen (NORCO) 5-325 MG per tablet Take 1 tablet by mouth every 6 (six) hours as needed.  10 tablet  0  . Iron-Folic Acid-Vit F09 (IRON FORMULA) 27-400-100 MG-MCG-MCG CAPS Take 1 tablet by mouth daily.      Marland Kitchen lisinopril (PRINIVIL,ZESTRIL) 40 MG tablet Take 1 tablet (40 mg total) by mouth daily.  30 tablet  12  . methocarbamol (ROBAXIN) 500 MG tablet Take 1 tablet (500 mg total) by mouth 2 (two) times daily.  20 tablet  0  . metoprolol succinate (TOPROL-XL) 50 MG 24 hr tablet Take 1 tablet (50 mg total) by mouth daily. Take with or immediately following a meal.  90 tablet  3  . oxyCODONE-acetaminophen (PERCOCET/ROXICET) 5-325 MG per tablet Take 1 tablet by mouth every 4 (four) hours as needed for severe pain.      . potassium chloride SA (K-DUR,KLOR-CON)  20 MEQ tablet Take 1 tablet (20 mEq total) by mouth 2 (two) times daily.  60 tablet  11   No current facility-administered medications for this visit.    Allergies:   Review of patient's allergies indicates no known allergies.   Social History:  The patient  reports that she has never smoked. She has never used smokeless tobacco. She reports that she drinks about 0.6 ounces of alcohol per week. She reports that she does not use illicit drugs.   Family History:  The patient's family history includes CAD in her mother.   ROS:  Please see the history of present illness.      All other systems reviewed and negative.   PHYSICAL EXAM: VS:  BP 126/65  Pulse 65  Ht 5' 6.5" (1.689 m)  Wt 242 lb (109.77 kg)  BMI 38.48  kg/m2 Well nourished, well developed, in no acute distress HEENT: normal Neck:  JVDdifficult to assess Cardiac:  normal S1, S2; RRR; no murmur Lungs:  Decreased breath sounds bilaterally, no wheezing, no rales Abd: somewhat distended, nontender Ext: no edema Skin: warm and dry Neuro:  CNs 2-12 intact, no focal abnormalities noted  EKG:   NSR, HR 66, LBBB   ASSESSMENT AND PLAN:  1. Chronic Combined Systolic and Diastolic CHF:  Volume appears stable. Dyspnea is likely multifactorial. Check follow up basic metabolic panel and BNP today. 2. Non-Ischemic Cardiomyopathy:  Continue ACEI and beta blocker. Her blood pressure will not likely tolerate further titration. 3. Hypertension:  Controlled. 4. Fatigue: She has symptoms that sound consistent with sleep apnea. Refer for sleep testing.  5. Disposition:  Follow up with me in one month.    Signed, Richardson Dopp, PA-C  06/27/2013 12:38 PM

## 2013-06-30 ENCOUNTER — Telehealth: Payer: Self-pay | Admitting: *Deleted

## 2013-06-30 DIAGNOSIS — I5042 Chronic combined systolic (congestive) and diastolic (congestive) heart failure: Secondary | ICD-10-CM

## 2013-06-30 DIAGNOSIS — I10 Essential (primary) hypertension: Secondary | ICD-10-CM

## 2013-06-30 DIAGNOSIS — I509 Heart failure, unspecified: Secondary | ICD-10-CM

## 2013-06-30 LAB — BASIC METABOLIC PANEL
BUN: 21 mg/dL (ref 6–23)
CHLORIDE: 99 meq/L (ref 96–112)
CO2: 29 meq/L (ref 19–32)
CREATININE: 1.5 mg/dL — AB (ref 0.4–1.2)
Calcium: 9.5 mg/dL (ref 8.4–10.5)
GFR: 40.87 mL/min — ABNORMAL LOW (ref >60.00)
Glucose, Bld: 88 mg/dL (ref 70–99)
POTASSIUM: 3.9 meq/L (ref 3.5–5.1)
Sodium: 139 mEq/L (ref 135–145)

## 2013-06-30 MED ORDER — FUROSEMIDE 80 MG PO TABS: ORAL_TABLET | ORAL | Status: DC

## 2013-06-30 NOTE — Telephone Encounter (Signed)
Follow up  ° ° ° °Returning call back to nurse  °

## 2013-06-30 NOTE — Telephone Encounter (Signed)
lvm with family member to have ptcb for lab results

## 2013-06-30 NOTE — Telephone Encounter (Signed)
pt notified about lab results and to hold tonights dose of lasix, start tomorrow 07/01/13 lasix 80 A/40 P, bmet 07/10/13. Pt verbalized understanding to Plan of Care.

## 2013-06-30 NOTE — Telephone Encounter (Signed)
MED LIST REFLECTS NEW DOSE FOR LASIX ; NEW RX SENT IN

## 2013-07-09 ENCOUNTER — Encounter: Payer: Self-pay | Admitting: *Deleted

## 2013-07-10 ENCOUNTER — Other Ambulatory Visit (INDEPENDENT_AMBULATORY_CARE_PROVIDER_SITE_OTHER): Payer: BC Managed Care – PPO

## 2013-07-10 DIAGNOSIS — I10 Essential (primary) hypertension: Secondary | ICD-10-CM

## 2013-07-10 DIAGNOSIS — I509 Heart failure, unspecified: Secondary | ICD-10-CM

## 2013-07-10 LAB — BASIC METABOLIC PANEL
BUN: 14 mg/dL (ref 6–23)
CO2: 27 meq/L (ref 19–32)
Calcium: 9.1 mg/dL (ref 8.4–10.5)
Chloride: 107 mEq/L (ref 96–112)
Creatinine, Ser: 1 mg/dL (ref 0.4–1.2)
GFR: 62.52 mL/min (ref >60.00)
Glucose, Bld: 100 mg/dL — ABNORMAL HIGH (ref 70–99)
POTASSIUM: 4.1 meq/L (ref 3.5–5.1)
SODIUM: 139 meq/L (ref 135–145)

## 2013-07-11 ENCOUNTER — Telehealth: Payer: Self-pay | Admitting: *Deleted

## 2013-07-11 NOTE — Telephone Encounter (Signed)
lmom labs ok, no changes to be made

## 2013-08-01 ENCOUNTER — Encounter (HOSPITAL_BASED_OUTPATIENT_CLINIC_OR_DEPARTMENT_OTHER): Payer: BC Managed Care – PPO

## 2013-08-08 ENCOUNTER — Ambulatory Visit (INDEPENDENT_AMBULATORY_CARE_PROVIDER_SITE_OTHER): Payer: BC Managed Care – PPO | Admitting: Physician Assistant

## 2013-08-08 ENCOUNTER — Telehealth: Payer: Self-pay | Admitting: *Deleted

## 2013-08-08 ENCOUNTER — Encounter: Payer: Self-pay | Admitting: Physician Assistant

## 2013-08-08 VITALS — BP 130/90 | HR 80 | Ht 66.5 in | Wt 252.0 lb

## 2013-08-08 DIAGNOSIS — R0600 Dyspnea, unspecified: Secondary | ICD-10-CM

## 2013-08-08 DIAGNOSIS — R5383 Other fatigue: Secondary | ICD-10-CM

## 2013-08-08 DIAGNOSIS — I5042 Chronic combined systolic (congestive) and diastolic (congestive) heart failure: Secondary | ICD-10-CM

## 2013-08-08 DIAGNOSIS — I10 Essential (primary) hypertension: Secondary | ICD-10-CM

## 2013-08-08 DIAGNOSIS — R0989 Other specified symptoms and signs involving the circulatory and respiratory systems: Secondary | ICD-10-CM

## 2013-08-08 DIAGNOSIS — R5381 Other malaise: Secondary | ICD-10-CM

## 2013-08-08 DIAGNOSIS — I428 Other cardiomyopathies: Secondary | ICD-10-CM

## 2013-08-08 DIAGNOSIS — R0609 Other forms of dyspnea: Secondary | ICD-10-CM

## 2013-08-08 LAB — BASIC METABOLIC PANEL
BUN: 13 mg/dL (ref 6–23)
CHLORIDE: 101 meq/L (ref 96–112)
CO2: 31 meq/L (ref 19–32)
CREATININE: 0.9 mg/dL (ref 0.4–1.2)
Calcium: 9.1 mg/dL (ref 8.4–10.5)
GFR: 70.49 mL/min (ref >60.00)
Glucose, Bld: 100 mg/dL — ABNORMAL HIGH (ref 70–99)
Potassium: 3.1 mEq/L — ABNORMAL LOW (ref 3.5–5.1)
Sodium: 137 mEq/L (ref 135–145)

## 2013-08-08 LAB — BRAIN NATRIURETIC PEPTIDE: Pro B Natriuretic peptide (BNP): 251 pg/mL — ABNORMAL HIGH (ref 0.0–100.0)

## 2013-08-08 MED ORDER — METOPROLOL SUCCINATE ER 50 MG PO TB24: 50.0000 mg | ORAL_TABLET | ORAL | Status: DC

## 2013-08-08 MED ORDER — POTASSIUM CHLORIDE CRYS ER 20 MEQ PO TBCR: 20.0000 meq | EXTENDED_RELEASE_TABLET | Freq: Three times a day (TID) | ORAL | Status: DC

## 2013-08-08 NOTE — Telephone Encounter (Signed)
pt notified about lab results and to increase K+ to 20 meq TID per Dr. Mare Ferrari who labs were shown to today. Bmet 3/27, pt verbalized understanding to Plan of Care

## 2013-08-08 NOTE — Progress Notes (Signed)
[place], [place], [place]  [address] Phone: [phone] Fax:  [phone]  Date:  08/08/2013   ID:  Darlene Pierce, DOB 1966-04-28, MRN [id number]  PCP:  No PCP Per Patient  Cardiologist:  Dr. Kirk Ruths     History of Present Illness: Darlene Pierce is a 48 y.o. female with a hx of HTN, non-Hodgkin's Lymphoma (radiation in 2000, chemotherapy in 2012), CHF.  She was admitted 1/3-1/6 with a/c combined systolic and diastolic CHF.  Echo (05/26/2013):  Mild LVH, EF 45-50%, Gr 1 DD, mild LAE, RVSP 27.   Records from [facility]:  LHC (12/08/2011):  EF 35%, normal coronary arteries.    Last seen 06/27/13. Since then, she is feeling much better. She has chronic dyspnea with exertion. She is probably NYHA class 2-2b. She denies orthopnea or LE edema. She does note increased abdominal girth. She has been going to a Physiological scientist. Her weights have been fairly stable. She denies syncope or chest pain. She denies significant cough.  Recent Labs: 05/25/2013: ALT 15; TSH 1.649  06/11/2013: Hemoglobin 12.7  06/27/2013: Pro B Natriuretic peptide (BNP) 104.0*  07/10/2013: Creatinine 1.0; Potassium 4.1    Wt Readings from Last 3 Encounters:  08/08/13 252 lb (114.306 kg)  06/27/13 242 lb (109.77 kg)  06/20/13 248 lb 6.4 oz (112.674 kg)     Past Medical History  Diagnosis Date  . Lymphoma     s/p chemotherapy 2012, radiation in 2000  . Hypertension   . Multiple thyroid nodules   . Respiratory failure     was admitted in [facility] and was on ventilator  . H/O angiography     unsure of any PCI    Current Outpatient Prescriptions  Medication Sig Dispense Refill  . albuterol (PROVENTIL HFA;VENTOLIN HFA) 108 (90 BASE) MCG/ACT inhaler Inhale 2 puffs into the lungs every 6 (six) hours as needed for wheezing or shortness of breath.  1 Inhaler  2  . Cholecalciferol (VITAMIN D) 2000 UNITS tablet Take 2,000 Units by mouth daily.      . furosemide (LASIX) 80 MG tablet LASIX 80 MG IN  THE AM AND 40 MG IN THE PM  45 tablet  11  . gabapentin (NEURONTIN) 100 MG capsule Take 100 mg by mouth daily.      Marland Kitchen HYDROcodone-acetaminophen (NORCO) 5-325 MG per tablet Take 1 tablet by mouth every 6 (six) hours as needed.  10 tablet  0  . lisinopril (PRINIVIL,ZESTRIL) 40 MG tablet Take 1 tablet (40 mg total) by mouth daily.  30 tablet  12  . metoprolol succinate (TOPROL-XL) 50 MG 24 hr tablet Take 1 tablet (50 mg total) by mouth daily. Take with or immediately following a meal.  90 tablet  3  . oxyCODONE-acetaminophen (PERCOCET/ROXICET) 5-325 MG per tablet Take 1 tablet by mouth every 4 (four) hours as needed for severe pain.      . potassium chloride SA (K-DUR,KLOR-CON) 20 MEQ tablet Take 1 tablet (20 mEq total) by mouth 2 (two) times daily.  60 tablet  11  . Iron-Folic Acid-Vit S85 (IRON FORMULA) 27-400-100 MG-MCG-MCG CAPS Take 1 tablet by mouth daily.       No current facility-administered medications for this visit.    Allergies:   Review of patient's allergies indicates no known allergies.   Social History:  The patient  reports that she has never smoked. She has never used smokeless tobacco. She reports that she drinks about 0.6 ounces of alcohol per  week. She reports that she does not use illicit drugs.   Family History:  The patient's family history includes CAD in her mother.   ROS:  Please see the history of present illness.      All other systems reviewed and negative.   PHYSICAL EXAM: VS:  BP 130/90  Pulse 80  Ht 5' 6.5" (1.689 m)  Wt 252 lb (114.306 kg)  BMI 40.07 kg/m2 Well nourished, well developed, in no acute distress HEENT: normal Neck:  No JVD  Cardiac:  normal S1, S2; RRR; no murmur Lungs:  Clear to ausc bilaterally, no wheezing, no rales Abd: somewhat distended, nontender Ext: no edema Skin: warm and dry Neuro:  CNs 2-12 intact, no focal abnormalities noted  EKG:   NSR, HR 80, LBBB   ASSESSMENT AND PLAN:  1. Chronic Combined Systolic and Diastolic CHF:   Volume appears stable.  Her weight is up and she notes increased abdominal girth.  However, her breathing remains improved.  Check BMET and BNP.  Increase Lasix if BNP increased. 2. Non-Ischemic Cardiomyopathy:  Continue ACEI and beta blocker.  3. Hypertension:   Needs better control.  Increase Toprol XL to 75 QD.  4. Fatigue:  Sleep testing is pending.    5. Disposition:  Follow up with me or Dr. Kirk Ruths in 6 mos.  Refer to primary care.     Signed, Richardson Dopp, PA-C  08/08/2013 12:05 PM

## 2013-08-08 NOTE — Patient Instructions (Signed)
INCREASE TOPROL XL TO 75 MG DAILY;THIS WILL BE 1 AND 1/2 TABS DAILY  LAB WORK TODAY; BMET, BNP  You have been referred to [place] physician wants you to follow-up in: [place] DR. CRENSHAW in two months in advance. If you don't receive a letter, please call our office to schedule the follow-up appointment.

## 2013-08-12 ENCOUNTER — Telehealth: Payer: Self-pay | Admitting: *Deleted

## 2013-08-12 DIAGNOSIS — I5042 Chronic combined systolic (congestive) and diastolic (congestive) heart failure: Secondary | ICD-10-CM

## 2013-08-12 MED ORDER — FUROSEMIDE 80 MG PO TABS: ORAL_TABLET | ORAL | Status: DC

## 2013-08-12 NOTE — Telephone Encounter (Signed)
pt advised to take extra 40 mg lasix x 2 days,then resume lasix 80 BID; increase K+ 40 meq x 3 days then resume K+ 20 TID, bmet 08/15/13. pt verbalized understanding to Plan of Care.

## 2013-08-12 NOTE — Telephone Encounter (Signed)
called pt to advise about lab work and lasix and K+ dose changes. Pt states she is already taking lasix 80 BID, I stated her chart shows new rx sent in 06/30/13 w/new Rx sig 80 am, 40 pm. Pt states bottle says 80 BID. I advised I need to d/w PA and cb later today.

## 2013-08-15 ENCOUNTER — Telehealth: Payer: Self-pay | Admitting: *Deleted

## 2013-08-15 ENCOUNTER — Other Ambulatory Visit (INDEPENDENT_AMBULATORY_CARE_PROVIDER_SITE_OTHER): Payer: BC Managed Care – PPO

## 2013-08-15 DIAGNOSIS — I428 Other cardiomyopathies: Secondary | ICD-10-CM

## 2013-08-15 DIAGNOSIS — I5042 Chronic combined systolic (congestive) and diastolic (congestive) heart failure: Secondary | ICD-10-CM

## 2013-08-15 DIAGNOSIS — I10 Essential (primary) hypertension: Secondary | ICD-10-CM

## 2013-08-15 LAB — BASIC METABOLIC PANEL
BUN: 13 mg/dL (ref 6–23)
CALCIUM: 9 mg/dL (ref 8.4–10.5)
CHLORIDE: 100 meq/L (ref 96–112)
CO2: 30 meq/L (ref 19–32)
CREATININE: 1 mg/dL (ref 0.4–1.2)
GFR: 62.49 mL/min (ref >60.00)
GLUCOSE: 97 mg/dL (ref 70–99)
Potassium: 3.4 mEq/L — ABNORMAL LOW (ref 3.5–5.1)
Sodium: 137 mEq/L (ref 135–145)

## 2013-08-15 NOTE — Telephone Encounter (Signed)
Dr. Mare Ferrari reviewed labs. Ordered for pt to increase potassium to 3meq twice a day. She could also take this 1 tablet 4 times a day  LMOVM with instructions to increase potassium as above. Horton Chin RN

## 2013-08-17 ENCOUNTER — Encounter (HOSPITAL_BASED_OUTPATIENT_CLINIC_OR_DEPARTMENT_OTHER): Payer: Self-pay | Admitting: Emergency Medicine

## 2013-08-17 ENCOUNTER — Emergency Department (HOSPITAL_BASED_OUTPATIENT_CLINIC_OR_DEPARTMENT_OTHER): Payer: BC Managed Care – PPO

## 2013-08-17 ENCOUNTER — Emergency Department (HOSPITAL_BASED_OUTPATIENT_CLINIC_OR_DEPARTMENT_OTHER)
Admission: EM | Admit: 2013-08-17 | Discharge: 2013-08-17 | Disposition: A | Discharge status: Home / Self Care | Payer: BC Managed Care – PPO | Attending: Emergency Medicine | Admitting: Emergency Medicine

## 2013-08-17 DIAGNOSIS — R141 Gas pain: Secondary | ICD-10-CM | POA: Insufficient documentation

## 2013-08-17 DIAGNOSIS — R142 Eructation: Secondary | ICD-10-CM | POA: Insufficient documentation

## 2013-08-17 DIAGNOSIS — E8779 Other fluid overload: Secondary | ICD-10-CM | POA: Insufficient documentation

## 2013-08-17 DIAGNOSIS — I10 Essential (primary) hypertension: Secondary | ICD-10-CM | POA: Insufficient documentation

## 2013-08-17 DIAGNOSIS — R059 Cough, unspecified: Secondary | ICD-10-CM | POA: Insufficient documentation

## 2013-08-17 DIAGNOSIS — R05 Cough: Secondary | ICD-10-CM | POA: Insufficient documentation

## 2013-08-17 DIAGNOSIS — I509 Heart failure, unspecified: Secondary | ICD-10-CM | POA: Insufficient documentation

## 2013-08-17 DIAGNOSIS — R609 Edema, unspecified: Secondary | ICD-10-CM | POA: Insufficient documentation

## 2013-08-17 DIAGNOSIS — Z79899 Other long term (current) drug therapy: Secondary | ICD-10-CM | POA: Insufficient documentation

## 2013-08-17 DIAGNOSIS — Z87898 Personal history of other specified conditions: Secondary | ICD-10-CM | POA: Insufficient documentation

## 2013-08-17 DIAGNOSIS — Z8709 Personal history of other diseases of the respiratory system: Secondary | ICD-10-CM | POA: Insufficient documentation

## 2013-08-17 DIAGNOSIS — R143 Flatulence: Secondary | ICD-10-CM

## 2013-08-17 HISTORY — DX: Heart failure, unspecified: I50.9

## 2013-08-17 LAB — BASIC METABOLIC PANEL
BUN: 13 mg/dL (ref 6–23)
CO2: 26 mEq/L (ref 19–32)
Calcium: 9.3 mg/dL (ref 8.4–10.5)
Chloride: 103 mEq/L (ref 96–112)
Creatinine, Ser: 1.1 mg/dL (ref 0.50–1.10)
GFR calc Af Amer: 69 mL/min — ABNORMAL LOW (ref >90)
GFR calc non Af Amer: 59 mL/min — ABNORMAL LOW (ref >90)
Glucose, Bld: 96 mg/dL (ref 70–99)
POTASSIUM: 4 meq/L (ref 3.7–5.3)
Sodium: 141 mEq/L (ref 137–147)

## 2013-08-17 LAB — CBC
HEMATOCRIT: 37.1 % (ref 36.0–46.0)
Hemoglobin: 12.2 g/dL (ref 12.0–15.0)
MCH: 29.5 pg (ref 26.0–34.0)
MCHC: 32.9 g/dL (ref 30.0–36.0)
MCV: 89.6 fL (ref 78.0–100.0)
Platelets: 258 10*3/uL (ref 150–400)
RBC: 4.14 MIL/uL (ref 3.87–5.11)
RDW: 13.5 % (ref 11.5–15.5)
WBC: 8.9 10*3/uL (ref 4.0–10.5)

## 2013-08-17 LAB — TROPONIN I: Troponin I: <0.3 ng/mL (ref <0.30)

## 2013-08-17 LAB — PRO B NATRIURETIC PEPTIDE: PRO B NATRI PEPTIDE: 2296 pg/mL — AB (ref 0–125)

## 2013-08-17 MED ORDER — FUROSEMIDE 10 MG/ML IJ SOLN
80.0000 mg | Freq: Once | INTRAMUSCULAR | Status: AC
  Administered 2013-08-17: 80 mg via INTRAVENOUS
  Filled 2013-08-17: qty 8

## 2013-08-17 NOTE — ED Notes (Signed)
Bedside toilet placed in patients room

## 2013-08-17 NOTE — ED Notes (Signed)
Discussed plan of care.  Dr Waverly Ferrari to reassess for discharge

## 2013-08-17 NOTE — ED Notes (Signed)
Sob since Friday.  Pt states she feels like she cannot take in enough air.  Some coughing with deep breath.  Chest feels tight.  No fever.

## 2013-08-17 NOTE — ED Provider Notes (Signed)
CSN: [id number]     Arrival date & time 08/17/13  1501 History   This chart was scribed for Orpah Greek, MD, by Neta Ehlers, ED Scribe. This patient was seen in room MH03/MH03 and the patient's care was started at 3:44 PM.  First MD Initiated Contact with Patient 08/17/13 1512     Chief Complaint  Patient presents with  . Shortness of Breath    The history is provided by the patient. No language interpreter was used.   HPI Comments: Darlene Pierce is a 48 y.o. Female, with a h/o HTN,  who presents to the Emergency Department complaining of gradually-worsening SOB which began two days ago. The SOB has interrupted her sleep, and she intermittently experiences a cough with breathing. Darlene Pierce has a h/o fluid retention, and she is currently experiencing fluid retention; she reports a gain of 20 pounds in the past week. She is retaining fluid in her abdomen and lower extremities. She denies fever, congestion, chest pain, or a h/o asthma. The pt is followed by Dr. Stanford Breed; he recently increased her Lasix and potassium dosage. She has been taking her medication as prescribed. Darlene Pierce is a non-smoker.   Past Medical History  Diagnosis Date  . Lymphoma     s/p chemotherapy 2012, radiation in 2000  . Hypertension   . Multiple thyroid nodules   . Respiratory failure     was admitted [place] [facility] and was on ventilator  . H/O angiography     unsure of any PCI   Past Surgical History  Procedure Laterality Date  . Tubal ligation    . Femur surgery    . Knee surgery    . Cholecystectomy    . Porta cath insertion      2 years   Family History  Problem Relation Age of Onset  . CAD Mother    History  Substance Use Topics  . Smoking status: Never Smoker   . Smokeless tobacco: Never Used  . Alcohol Use: 0.6 oz/week    1 Glasses of wine per week     Comment: Occasional   No OB history provided.  Review of Systems  Constitutional: Negative for fever.  HENT:  Negative for congestion.   Respiratory: Positive for cough and shortness of breath.   Cardiovascular: Negative for chest pain.  Gastrointestinal: Positive for abdominal distention.  All other systems reviewed and are negative.    Allergies  Review of patient's allergies indicates no known allergies.  Home Medications   Current Outpatient Rx  Name  Route  Sig  Dispense  Refill  . albuterol (PROVENTIL HFA;VENTOLIN HFA) 108 (90 BASE) MCG/ACT inhaler   Inhalation   Inhale 2 puffs into the lungs every 6 (six) hours as needed for wheezing or shortness of breath.   1 Inhaler   2   . Cholecalciferol (VITAMIN D) 2000 UNITS tablet   Oral   Take 2,000 Units by mouth daily.         . furosemide (LASIX) 80 MG tablet      Take 80 MG twice daily         . gabapentin (NEURONTIN) 100 MG capsule   Oral   Take 100 mg by mouth daily.         Marland Kitchen HYDROcodone-acetaminophen (NORCO) 5-325 MG per tablet   Oral   Take 1 tablet by mouth every 6 (six) hours as needed.   10 tablet   0   . Iron-Folic Acid-Vit  B12 (IRON FORMULA) 27-400-100 MG-MCG-MCG CAPS   Oral   Take 1 tablet by mouth daily.         Marland Kitchen lisinopril (PRINIVIL,ZESTRIL) 40 MG tablet   Oral   Take 1 tablet (40 mg total) by mouth daily.   30 tablet   12   . metoprolol succinate (TOPROL-XL) 50 MG 24 hr tablet   Oral   Take 1 tablet (50 mg total) by mouth as directed. Take 1 and 1/2 tabs daily = 75 mg daily   45 tablet   11   . oxyCODONE-acetaminophen (PERCOCET/ROXICET) 5-325 MG per tablet   Oral   Take 1 tablet by mouth every 4 (four) hours as needed for severe pain.         . potassium chloride SA (K-DUR,KLOR-CON) 20 MEQ tablet   Oral   Take 1 tablet (20 mEq total) by mouth 3 (three) times daily.   90 tablet   11    Triage Vitals: BP 143/110  Pulse 98  Temp(Src) 98.3 F (36.8 C)  Resp 24  SpO2 100%  Physical Exam  Constitutional: She is oriented to person, place, and time. She appears well-developed and  well-nourished. No distress.  HENT:  Head: Normocephalic and atraumatic.  Right Ear: Hearing normal.  Left Ear: Hearing normal.  Nose: Nose normal.  Mouth/Throat: Oropharynx is clear and moist and mucous membranes are normal.  Eyes: Conjunctivae and EOM are normal. Pupils are equal, round, and reactive to light.  Neck: Normal range of motion. Neck supple.  Cardiovascular: Regular rhythm, S1 normal and S2 normal.  Exam reveals no gallop and no friction rub.   No murmur heard. Pulmonary/Chest: Effort normal and breath sounds normal. No respiratory distress. She exhibits no tenderness.  Abdominal: Soft. Normal appearance and bowel sounds are normal. There is no hepatosplenomegaly. There is no tenderness. There is no rebound, no guarding, no tenderness at McBurney's point and negative Murphy's sign. No hernia.  Abdominal fullness without tenderness. Bowel sounds present.  Musculoskeletal: Normal range of motion. She exhibits edema.  Neurological: She is alert and oriented to person, place, and time. She has normal strength. No cranial nerve deficit or sensory deficit. Coordination normal. GCS eye subscore is 4. GCS verbal subscore is 5. GCS motor subscore is 6.  Skin: Skin is warm, dry and intact. No rash noted. No cyanosis.  Psychiatric: She has a normal mood and affect. Her speech is normal and behavior is normal. Thought content normal.    ED Course  Procedures (including critical care time)  DIAGNOSTIC STUDIES: Oxygen Saturation is 100% on room air, normal by my interpretation.    COORDINATION OF CARE:  3:47 PM- Discussed treatment plan with patient, and the patient agreed to the plan. The plan includes a CBC, EKG, and troponin.   Labs Review Labs Reviewed  BASIC METABOLIC PANEL - Abnormal; Notable for the following:    GFR calc non Af Amer 59 (*)    GFR calc Af Amer 69 (*)    All other components within normal limits  PRO B NATRIURETIC PEPTIDE - Abnormal; Notable for the  following:    Pro B Natriuretic peptide (BNP) 2296.0 (*)    All other components within normal limits  CBC  TROPONIN I   Imaging Review Dg Chest 2 View  08/17/2013   CLINICAL DATA:  Shortness of breath.  Cough.  EXAM: CHEST  2 VIEW  COMPARISON:  06/12/2013.  FINDINGS: Stable enlarged cardiac silhouette. Diffusely prominent interstitial markings and pulmonary  vasculature with mild improvement. No pleural fluid. Stable left subclavian port catheter. Bilateral AC joint degenerative changes.  IMPRESSION: Mildly improved changes of congestive heart failure with stable cardiomegaly.   Electronically Signed   By: Enrique Sack M.D.   On: 08/17/2013 16:31     EKG Interpretation None      Date: 08/17/2013  Rate: 86   Rhythm: normal sinus rhythm  QRS Axis: left  Intervals: normal  ST/T Wave abnormalities: normal  Conduction Disutrbances:left bundle branch block  Narrative Interpretation:   Old EKG Reviewed: unchanged    MDM   Final diagnoses:  None   Patient presents to the ER for evaluation of abdominal distention, shortness of breath and a 20 pound weight gain. She has a history of congestive heart failure. She has had her Lasix increased in the past week by her cardiologist. She is not in any respiratory distress. Oxygen saturation is 100%. In her records reveals that her ejection fraction in January of this year was 45-50%.  I discussed her care with Doctor Marlou Porch, on call for her cardiologist. He did confirm to me that the patient could be diuresis here in the ER and she had good response, discharged to followup in the office. Patient was given Lasix 80 mg IV and has had approximately 4 L of urine output here in the ER. She will therefore be discharged. Continue the Lasix 80 mg in the morning, 120 mg at night (this is the increased regimen started this week by her cardiologist). All up in the office as soon as possible. Return to ER for worsening condition.  I personally performed the  services described in this documentation, which was scribed in my presence. The recorded information has been reviewed and is accurate.      Orpah Greek, MD 08/17/13 2126

## 2013-08-17 NOTE — Discharge Instructions (Signed)
Heart Failure °Heart failure is a condition in which the heart has trouble pumping blood. This means your heart does not pump blood efficiently for your body to work well. In some cases of heart failure, fluid may back up into your lungs or you may have swelling (edema) in your lower legs. Heart failure is usually a long-term (chronic) condition. It is important for you to take good care of yourself and follow your caregiver's treatment plan. °CAUSES  °Some health conditions can cause heart failure. Those health conditions include: °· High blood pressure (hypertension) causes the heart muscle to work harder than normal. When pressure in the blood vessels is high, the heart needs to pump (contract) with more force in order to circulate blood throughout the body. High blood pressure eventually causes the heart to become stiff and weak. °· Coronary artery disease (CAD) is the buildup of cholesterol and fat (plaque) in the arteries of the heart. The blockage in the arteries deprives the heart muscle of oxygen and blood. This can cause chest pain and may lead to a heart attack. High blood pressure can also contribute to CAD. °· Heart attack (myocardial infarction) occurs when 1 or more arteries in the heart become blocked. The loss of oxygen damages the muscle tissue of the heart. When this happens, part of the heart muscle dies. The injured tissue does not contract as well and weakens the heart's ability to pump blood. °· Abnormal heart valves can cause heart failure when the heart valves do not open and close properly. This makes the heart muscle pump harder to keep the blood flowing. °· Heart muscle disease (cardiomyopathy or myocarditis) is damage to the heart muscle from a variety of causes. These can include drug or alcohol abuse, infections, or unknown reasons. These can increase the risk of heart failure. °· Lung disease makes the heart work harder because the lungs do not work properly. This can cause a strain  on the heart, leading it to fail. °· Diabetes increases the risk of heart failure. High blood sugar contributes to high fat (lipid) levels in the blood. Diabetes can also cause slow damage to tiny blood vessels that carry important nutrients to the heart muscle. When the heart does not get enough oxygen and food, it can cause the heart to become weak and stiff. This leads to a heart that does not contract efficiently. °· Other conditions can contribute to heart failure. These include abnormal heart rhythms, thyroid problems, and low blood counts (anemia). °Certain unhealthy behaviors can increase the risk of heart failure. Those unhealthy behaviors include: °· Being overweight. °· Smoking or chewing tobacco. °· Eating foods high in fat and cholesterol. °· Abusing illicit drugs or alcohol. °· Lacking physical activity. °SYMPTOMS  °Heart failure symptoms may vary and can be hard to detect. Symptoms may include: °· Shortness of breath with activity, such as climbing stairs. °· Persistent cough. °· Swelling of the feet, ankles, legs, or abdomen. °· Unexplained weight gain. °· Difficulty breathing when lying flat (orthopnea). °· Waking from sleep because of the need to sit up and get more air. °· Rapid heartbeat. °· Fatigue and loss of energy. °· Feeling lightheaded, dizzy, or close to fainting. °· Loss of appetite. °· Nausea. °· Increased urination during the night (nocturia). °DIAGNOSIS  °A diagnosis of heart failure is based on your history, symptoms, physical examination, and diagnostic tests. °Diagnostic tests for heart failure may include: °· Echocardiography. °· Electrocardiography. °· Chest X-ray. °· Blood tests. °· Exercise   stress test. °· Cardiac angiography. °· Radionuclide scans. °TREATMENT  °Treatment is aimed at managing the symptoms of heart failure. Medicines, behavioral changes, or surgical intervention may be necessary to treat heart failure. °· Medicines to help treat heart failure may  include: °· Angiotensin-converting enzyme (ACE) inhibitors. This type of medicine blocks the effects of a blood protein called angiotensin-converting enzyme. ACE inhibitors relax (dilate) the blood vessels and help lower blood pressure. °· Angiotensin receptor blockers. This type of medicine blocks the actions of a blood protein called angiotensin. Angiotensin receptor blockers dilate the blood vessels and help lower blood pressure. °· Water pills (diuretics). Diuretics cause the kidneys to remove salt and water from the blood. The extra fluid is removed through urination. This loss of extra fluid lowers the volume of blood the heart pumps. °· Beta blockers. These prevent the heart from beating too fast and improve heart muscle strength. °· Digitalis. This increases the force of the heartbeat. °· Healthy behavior changes include: °· Obtaining and maintaining a healthy weight. °· Stopping smoking or chewing tobacco. °· Eating heart healthy foods. °· Limiting or avoiding alcohol. °· Stopping illicit drug use. °· Physical activity as directed by your caregiver. °· Surgical treatment for heart failure may include: °· A procedure to open blocked arteries, repair damaged heart valves, or remove damaged heart muscle tissue. °· A pacemaker to improve heart muscle function and control certain abnormal heart rhythms. °· An internal cardioverter defibrillator to treat certain serious abnormal heart rhythms. °· A left ventricular assist device to assist the pumping ability of the heart. °HOME CARE INSTRUCTIONS  °· Take your medicine as directed by your caregiver. Medicines are important in reducing the workload of your heart, slowing the progression of heart failure, and improving your symptoms. °· Do not stop taking your medicine unless directed by your caregiver. °· Do not skip any dose of medicine. °· Refill your prescriptions before you run out of medicine. Your medicines are needed every day. °· Take over-the-counter  medicine only as directed by your caregiver or pharmacist. °· Engage in moderate physical activity if directed by your caregiver. Moderate physical activity can benefit some people. The elderly and people with severe heart failure should consult with a caregiver for physical activity recommendations. °· Eat heart healthy foods. Food choices should be free of trans fat and low in saturated fat, cholesterol, and salt (sodium). Healthy choices include fresh or frozen fruits and vegetables, fish, lean meats, legumes, fat-free or low-fat dairy products, and whole grain or high fiber foods. Talk to a dietitian to learn more about heart healthy foods. °· Limit sodium if directed by your caregiver. Sodium restriction may reduce symptoms of heart failure in some people. Talk to a dietitian to learn more about heart healthy seasonings. °· Use healthy cooking methods. Healthy cooking methods include roasting, grilling, broiling, baking, poaching, steaming, or stir-frying. Talk to a dietitian to learn more about healthy cooking methods. °· Limit fluids if directed by your caregiver. Fluid restriction may reduce symptoms of heart failure in some people. °· Weigh yourself every day. Daily weights are important in the early recognition of excess fluid. You should weigh yourself every morning after you urinate and before you eat breakfast. Wear the same amount of clothing each time you weigh yourself. Record your daily weight. Provide your caregiver with your weight record. °· Monitor and record your blood pressure if directed by your caregiver. °· Check your pulse if directed by your caregiver. °· Lose weight if directed   by your caregiver. Weight loss may reduce symptoms of heart failure in some people. °· Stop smoking or chewing tobacco. Nicotine makes your heart work harder by causing your blood vessels to constrict. Do not use nicotine gum or patches before talking to your caregiver. °· Schedule and attend follow-up visits as  directed by your caregiver. It is important to keep all your appointments. °· Limit alcohol intake to no more than 1 drink per day for nonpregnant women and 2 drinks per day for men. Drinking more than that is harmful to your heart. Tell your caregiver if you drink alcohol several times a week. Talk with your caregiver about whether alcohol is safe for you. If your heart has already been damaged by alcohol or you have severe heart failure, drinking alcohol should be stopped completely. °· Stop illicit drug use. °· Stay up-to-date with immunizations. It is especially important to prevent respiratory infections through current pneumococcal and influenza immunizations. °· Manage other health conditions such as hypertension, diabetes, thyroid disease, or abnormal heart rhythms as directed by your caregiver. °· Learn to manage stress. °· Plan rest periods when fatigued. °· Learn strategies to manage high temperatures. If the weather is extremely hot: °· Avoid vigorous physical activity. °· Use air conditioning or fans or seek a cooler location. °· Avoid caffeine and alcohol. °· Wear loose-fitting, lightweight, and light-colored clothing. °· Learn strategies to manage cold temperatures. If the weather is extremely cold: °· Avoid vigorous physical activity. °· Layer clothes. °· Wear mittens or gloves, a hat, and a scarf when going outside. °· Avoid alcohol. °· Obtain ongoing education and support as needed. °· Participate or seek rehabilitation as needed to maintain or improve independence and quality of life. °SEEK MEDICAL CARE IF:  °· Your weight increases by 03 lb/1.4 kg in 1 day or 05 lb/2.3 kg in a week. °· You have increasing shortness of breath that is unusual for you. °· You are unable to participate in your usual physical activities. °· You tire easily. °· You cough more than normal, especially with physical activity. °· You have any or more swelling in areas such as your hands, feet, ankles, or abdomen. °· You  are unable to sleep because it is hard to breathe. °· You feel like your heart is beating fast (palpitations). °· You become dizzy or lightheaded upon standing up. °SEEK IMMEDIATE MEDICAL CARE IF:  °· You have difficulty breathing. °· There is a change in mental status such as decreased alertness or difficulty with concentration. °· You have a pain or discomfort in your chest. °· You have an episode of fainting (syncope). °MAKE SURE YOU:  °· Understand these instructions. °· Will watch your condition. °· Will get help right away if you are not doing well or get worse. °Document Released: 05/08/2005 Document Revised: 09/02/2012 Document Reviewed: 05/30/2012 °ExitCare® Patient Information ©2014 [employer]. ° °

## 2013-08-18 ENCOUNTER — Telehealth: Payer: Self-pay | Admitting: Cardiology

## 2013-08-18 DIAGNOSIS — I10 Essential (primary) hypertension: Secondary | ICD-10-CM

## 2013-08-18 DIAGNOSIS — I428 Other cardiomyopathies: Secondary | ICD-10-CM

## 2013-08-18 DIAGNOSIS — I5042 Chronic combined systolic (congestive) and diastolic (congestive) heart failure: Secondary | ICD-10-CM

## 2013-08-18 MED ORDER — FUROSEMIDE 80 MG PO TABS: ORAL_TABLET | ORAL | Status: DC

## 2013-08-18 MED ORDER — POTASSIUM CHLORIDE CRYS ER 20 MEQ PO TBCR: 80.0000 meq | EXTENDED_RELEASE_TABLET | Freq: Two times a day (BID) | ORAL | Status: DC

## 2013-08-18 NOTE — Telephone Encounter (Signed)
Spoke with Trinidad Curet MA, Arbie Cookey.  Ok to schedule patient on 4/1 at 12:10.   Pt aware and agreeable to this time.

## 2013-08-18 NOTE — Telephone Encounter (Signed)
pt notified about her lab results from Friday. I asked pt if she rcv'd message friday to increase K+, she said yes message said 1 tablet 4 x daily; said no that would only be K+ 80 meq QD; I stated per Brynda Rim. and DOD need to increase to K+ 80 meq BID, with BMET 1 week, I asked pt exactly how much lasix is she taking and she states lasix 80 mg bid also taking the extra 40 mg daily as well. Pt s/w Michalene, RN about fluid retention and has been made an appt for 08/20/13 with Nicki Reaper W. PA.

## 2013-08-18 NOTE — Telephone Encounter (Signed)
New Prob    Pt states she was in the hospital and was told to follow up within 2 days. No availability for Dr. Stanford Breed or NP/PA. Pt states she would like to speak to a nurse regarding this.

## 2013-08-20 ENCOUNTER — Ambulatory Visit (INDEPENDENT_AMBULATORY_CARE_PROVIDER_SITE_OTHER): Payer: BC Managed Care – PPO | Admitting: Physician Assistant

## 2013-08-20 ENCOUNTER — Ambulatory Visit (HOSPITAL_BASED_OUTPATIENT_CLINIC_OR_DEPARTMENT_OTHER): Payer: BC Managed Care – PPO | Attending: Physician Assistant | Admitting: Radiology

## 2013-08-20 ENCOUNTER — Encounter: Payer: Self-pay | Admitting: Physician Assistant

## 2013-08-20 VITALS — BP 120/98 | HR 82 | Ht 66.5 in | Wt 251.1 lb

## 2013-08-20 VITALS — Ht 66.5 in | Wt 251.0 lb

## 2013-08-20 DIAGNOSIS — I428 Other cardiomyopathies: Secondary | ICD-10-CM

## 2013-08-20 DIAGNOSIS — G4733 Obstructive sleep apnea (adult) (pediatric): Secondary | ICD-10-CM | POA: Insufficient documentation

## 2013-08-20 DIAGNOSIS — R5381 Other malaise: Secondary | ICD-10-CM | POA: Insufficient documentation

## 2013-08-20 DIAGNOSIS — R0602 Shortness of breath: Secondary | ICD-10-CM

## 2013-08-20 DIAGNOSIS — I10 Essential (primary) hypertension: Secondary | ICD-10-CM

## 2013-08-20 DIAGNOSIS — I5042 Chronic combined systolic (congestive) and diastolic (congestive) heart failure: Secondary | ICD-10-CM

## 2013-08-20 DIAGNOSIS — R5383 Other fatigue: Secondary | ICD-10-CM

## 2013-08-20 NOTE — Patient Instructions (Addendum)
LAB WORK IN 1 WEEK (BMET, BNP)  PLEASE FOLLOW UP WITH SCOTT WEAVER, PAC IN 2 WEEKS SAME DAY DR. Stanford Breed IS IN THE OFFICE  FOR THE NEXT 4 DAYS INCREASE LASIX TO 120 MG IN THE MORNING AND 80 MG IN THE PM FOR THE NEXT 4 DAYS INCREASE POTASSIUM TO 100 MEQ IN THE MORNING AND 80 MEQ IN THE PM  AFTER THE 4 DAYS OF INCREASED LASIX AND POTASSIUM YOU WILL RESUME YOUR REGULAR DOSE OF LASIX AND POTASSIUM   MAKE SURE TO WEIGH DAILY AND IF WT IS UP 3 LB'S IN 1 DAY OK THEN TO TAKE EXTRA LASIX 40 MG AND CALL THE OFFICE [phone] TO LET THE DOCTOR KNOW

## 2013-08-20 NOTE — Progress Notes (Signed)
[place], [place], [place]  [address] Phone: [phone] Fax:  [phone]  Date:  08/20/2013   ID:  Darlene Pierce, DOB [birth year], MRN [id number]  PCP:  No PCP Per Patient  Cardiologist:  Dr. Kirk Ruths     History of Present Illness: Darlene Pierce is a 48 y.o. female with a hx of HTN, non-Hodgkin's Lymphoma (radiation in 2000, chemotherapy in 2012), CHF.  She was admitted 05/2013 with a/c combined systolic and diastolic CHF.  I last saw her 08/08/13. She was overall stable. Her weight was up some. Check a BNP was minimally elevated. I had her take extra Lasix for several days. Blood pressure was somewhat uncontrolled and I adjusted her Toprol. Since last seen, she went to the emergency room on 08/17/13. She reported a 20 pound weight gain and increased abdominal distention. She was given IV Lasix 80 mg x 1 with 4L of urine output. She returns for follow up.  She is feeling somewhat better.  Her abdominal bloating is improved.  She still feels short of breath.  She is NYHA Class 2b-3.  She notes 2-3 pillow orthopnea.  No PND.  No LE edema. No chest pain.  No syncope.  She has a non-productive cough sometimes.     Studies:  - Echo (05/26/2013):  Mild LVH, EF 45-50%, Gr 1 DD, mild LAE, RVSP 27.     - Records from [facility]:  LHC (12/08/2011):  EF 35%, normal coronary arteries.     Recent Labs: 05/25/2013: ALT 15; TSH 1.649  08/17/2013: Creatinine 1.10; Hemoglobin 12.2; Potassium 4.0; Pro B Natriuretic peptide (BNP) 2296.0*    Wt Readings from Last 3 Encounters:  08/20/13 251 lb 1.9 oz (113.907 kg)  08/08/13 252 lb (114.306 kg)  06/27/13 242 lb (109.77 kg)     Past Medical History  Diagnosis Date  . Lymphoma     s/p chemotherapy 2012, radiation in 2000  . Hypertension   . Multiple thyroid nodules   . Respiratory failure     was [facility] and was on ventilator  . H/O angiography     unsure of any PCI  . CHF (congestive heart failure)     Current  Outpatient Prescriptions  Medication Sig Dispense Refill  . albuterol (PROVENTIL HFA;VENTOLIN HFA) 108 (90 BASE) MCG/ACT inhaler Inhale 2 puffs into the lungs every 6 (six) hours as needed for wheezing or shortness of breath.  1 Inhaler  2  . Cholecalciferol (VITAMIN D) 2000 UNITS tablet Take 2,000 Units by mouth daily.      . furosemide (LASIX) 80 MG tablet Take 80 MG twice daily; may extra 40 mg daily for wt gain up 3 lb x 1 day      . gabapentin (NEURONTIN) 100 MG capsule Take 100 mg by mouth daily.      Marland Kitchen HYDROcodone-acetaminophen (NORCO) 5-325 MG per tablet Take 1 tablet by mouth every 6 (six) hours as needed.  10 tablet  0  . Iron-Folic Acid-Vit K91 (IRON FORMULA) 27-400-100 MG-MCG-MCG CAPS Take 1 tablet by mouth daily.      Marland Kitchen lisinopril (PRINIVIL,ZESTRIL) 40 MG tablet Take 1 tablet (40 mg total) by mouth daily.  30 tablet  12  . metoprolol succinate (TOPROL-XL) 50 MG 24 hr tablet Take 1 tablet (50 mg total) by mouth as directed. Take 1 and 1/2 tabs daily = 75 mg daily  45 tablet  11  . oxyCODONE-acetaminophen (PERCOCET/ROXICET) 5-325 MG per tablet Take 1  tablet by mouth every 4 (four) hours as needed for severe pain.      . potassium chloride SA (K-DUR,KLOR-CON) 20 MEQ tablet Take 4 tablets (80 mEq total) by mouth 2 (two) times daily.       No current facility-administered medications for this visit.    Allergies:   Review of patient's allergies indicates no known allergies.   Social History:  The patient  reports that she has never smoked. She has never used smokeless tobacco. She reports that she drinks about 0.6 ounces of alcohol per week. She reports that she does not use illicit drugs.   Family History:  The patient's family history includes CAD in her mother.   ROS:  Please see the history of present illness.      All other systems reviewed and negative.   PHYSICAL EXAM: VS:  BP 120/98  Pulse 82  Ht 5' 6.5" (1.689 m)  Wt 251 lb 1.9 oz (113.907 kg)  BMI 39.93 kg/m2 Well  nourished, well developed, in no acute distress HEENT: normal Neck:  Minimally elevated JVD  Cardiac:  normal S1, S2; RRR; no murmur Lungs:  Clear to ausc bilaterally, no wheezing, no rales Abd: somewhat distended, nontender Ext: no edema Skin: warm and dry Neuro:  CNs 2-12 intact, no focal abnormalities noted  EKG:   NSR, HR 82, LBBB  ASSESSMENT AND PLAN:  1. Chronic Combined Systolic and Diastolic CHF:  She is still somewhat volume overloaded.  Increase Lasix to 120 in AM and 80 in PM with extra K+ for 4 days.  Then resume usual dose of both.  Check BMET and BNP in 1 week.  She is getting frustrated with difficult to control volume.  She denies excessive fluid intake or dietary indiscretion with Na.  Consider repeating her echo if she continues to have difficulty. Also, consider referring to CHF clinic if her problems continue.  I will need to review this with Dr. Kirk Ruths. 2. Non-Ischemic Cardiomyopathy:  Continue ACEI and beta blocker.  3. Hypertension:   BP elevated some today.  Adjust Lasix as noted. Consider adding Spironolactone at follow up to alleviate some of her K+ supplementation.  4. Disposition:  Follow up with me in 2 weeks.   Signed, Richardson Dopp, PA-C  08/20/2013 12:42 PM

## 2013-09-01 DIAGNOSIS — G4730 Sleep apnea, unspecified: Secondary | ICD-10-CM

## 2013-09-01 DIAGNOSIS — G4710 Hypersomnia, unspecified: Secondary | ICD-10-CM

## 2013-09-01 DIAGNOSIS — R5383 Other fatigue: Secondary | ICD-10-CM

## 2013-09-01 DIAGNOSIS — R5381 Other malaise: Secondary | ICD-10-CM

## 2013-09-01 DIAGNOSIS — G4733 Obstructive sleep apnea (adult) (pediatric): Secondary | ICD-10-CM

## 2013-09-01 NOTE — Sleep Study (Signed)
   NAME: Darlene Pierce DATE OF BIRTH:  March 21, 1966 MEDICAL RECORD NUMBER [id number]  LOCATION: [facility]  PHYSICIAN: Armando Reichert Tameah Mihalko  DATE OF STUDY: 08/20/2013  SLEEP STUDY TYPE: Nocturnal Polysomnogram               REFERRING PHYSICIAN: Richardson Dopp T, PA-C  INDICATION FOR STUDY: Hypersomnia with sleep apnea  EPWORTH SLEEPINESS SCORE:  11 HEIGHT: 5' 6.5" (168.9 cm)  WEIGHT: 251 lb (113.853 kg)    Body mass index is 39.91 kg/(m^2).  NECK SIZE: 16 in.  MEDICATIONS: Reviewed in the sleep record  SLEEP ARCHITECTURE: The patient had a total sleep time of 256 minutes with very little slow-wave sleep and only 89 minutes of REM. Sleep onset latency was prolonged at 101 minutes, and REM onset was normal at 70 minutes. Sleep efficiency was severely reduced at 69%.  RESPIRATORY DATA: The patient was found to have 5 apneas and 58 obstructive hypopneas, giving her an AHI of 15 events per hour. The events occurred almost exclusively during REM, and were not positional in nature. There was mild to moderate snoring noted throughout.  OXYGEN DATA: There was oxygen desaturation as low as 83% with the patient's obstructive events  CARDIAC DATA: Occasional PAC noted.  MOVEMENT/PARASOMNIA: There were no significant limb movements or abnormal behaviors seen.  IMPRESSION/ RECOMMENDATION:    1)  mild obstructive sleep apnea/hypopnea syndrome, with an AHI of 15 events per hour and oxygen desaturation as low as 83%. Treatment for this degree of sleep apnea can include a trial of weight loss alone, upper airway surgery, dental appliance, and also CPAP. Clinical correlation is suggested.  2)  occasional PAC noted, but no clinically significant arrhythmias were seen.     [place], American Board of Sleep Medicine  ELECTRONICALLY SIGNED ON:  09/01/2013, 6:19 PM [place] PH: [phone]   FX: [phone] [place]

## 2013-09-02 ENCOUNTER — Ambulatory Visit: Payer: BC Managed Care – PPO | Admitting: Physician Assistant

## 2013-09-03 ENCOUNTER — Encounter (HOSPITAL_BASED_OUTPATIENT_CLINIC_OR_DEPARTMENT_OTHER): Payer: Self-pay | Admitting: Radiology

## 2013-09-03 NOTE — Sleep Study (Signed)
Sleep study shows mild sleep apnea. Suggest weight loss for now. If fatigue continues and patient has difficulty in losing weight, will refer to sleep medicine for further recommendations. Richardson Dopp, PA-C   09/03/2013 5:02 PM

## 2013-09-04 ENCOUNTER — Encounter: Payer: Self-pay | Admitting: Physician Assistant

## 2013-09-04 ENCOUNTER — Ambulatory Visit (INDEPENDENT_AMBULATORY_CARE_PROVIDER_SITE_OTHER): Payer: BC Managed Care – PPO | Admitting: Physician Assistant

## 2013-09-04 VITALS — BP 130/88 | HR 77 | Ht 66.5 in | Wt 252.0 lb

## 2013-09-04 DIAGNOSIS — R0600 Dyspnea, unspecified: Secondary | ICD-10-CM

## 2013-09-04 DIAGNOSIS — R0989 Other specified symptoms and signs involving the circulatory and respiratory systems: Secondary | ICD-10-CM

## 2013-09-04 DIAGNOSIS — I428 Other cardiomyopathies: Secondary | ICD-10-CM

## 2013-09-04 DIAGNOSIS — I10 Essential (primary) hypertension: Secondary | ICD-10-CM

## 2013-09-04 DIAGNOSIS — R0609 Other forms of dyspnea: Secondary | ICD-10-CM

## 2013-09-04 DIAGNOSIS — I5042 Chronic combined systolic (congestive) and diastolic (congestive) heart failure: Secondary | ICD-10-CM

## 2013-09-04 DIAGNOSIS — G4733 Obstructive sleep apnea (adult) (pediatric): Secondary | ICD-10-CM | POA: Insufficient documentation

## 2013-09-04 MED ORDER — POTASSIUM CHLORIDE CRYS ER 20 MEQ PO TBCR: 60.0000 meq | EXTENDED_RELEASE_TABLET | Freq: Two times a day (BID) | ORAL | Status: DC

## 2013-09-04 MED ORDER — SPIRONOLACTONE 25 MG PO TABS: 25.0000 mg | ORAL_TABLET | Freq: Every day | ORAL | Status: DC

## 2013-09-04 MED ORDER — METOPROLOL SUCCINATE ER 100 MG PO TB24
100.0000 mg | ORAL_TABLET | Freq: Every day | ORAL | Status: DC
Start: 2013-09-04 — End: 2013-09-24

## 2013-09-04 NOTE — Progress Notes (Signed)
[place], [place], [place]  [address] Phone: [phone] Fax:  [phone]  Date:  09/04/2013   ID:  [name], DOB 1966/03/09, MRN [id number]  PCP:  No PCP Per Patient  Cardiologist:  Dr. Kirk Ruths     History of Present Illness: Darlene Pierce is a 48 y.o. female with a hx of HTN, non-Hodgkin's Lymphoma (radiation in 2000, chemotherapy in 2012), CHF.    She was admitted 05/2013 with a/c combined systolic and diastolic CHF.   She went to the emergency room on 08/17/13 with a 20 pound weight gain and increased abdominal distention. She was given IV Lasix 80 mg x 1 with 4L of urine output.   I saw her 08/20/13. She was still somewhat volume overloaded. I adjusted her Lasix for several days. She returns for close follow up.  She continues to feel short of breath.  She has not noted a difference.  She continues to note increased abdominal girth and has not lost any weight.  She has occasional chest pain with exertion when she is more short of breath.  She does wheeze.  She continues to sleep on 2-3 pillows.  She awakens often at night.  No LE edema.   Studies:  - Echo (05/26/2013):  Mild LVH, EF 45-50%, Gr 1 DD, mild LAE, RVSP 27.     - Records from [facility]:  LHC (12/08/2011):  EF 35%, normal coronary arteries.     Recent Labs: 05/25/2013: ALT 15; TSH 1.649  08/17/2013: Creatinine 1.10; Hemoglobin 12.2; Potassium 4.0; Pro B Natriuretic peptide (BNP) 2296.0*    Wt Readings from Last 3 Encounters:  09/04/13 252 lb (114.306 kg)  08/20/13 251 lb (113.853 kg)  08/20/13 251 lb 1.9 oz (113.907 kg)     Past Medical History  Diagnosis Date  . Lymphoma     s/p chemotherapy 2012, radiation in 2000  . Hypertension   . Multiple thyroid nodules   . Respiratory failure     was admitted [facility] and was on ventilator  . H/O angiography     unsure of any PCI  . CHF (congestive heart failure)   . OSA (obstructive sleep apnea)     a. mild by sleep study 08/2013  - trial of weight loss;  refer to sleep med if unsucessful    Current Outpatient Prescriptions  Medication Sig Dispense Refill  . albuterol (PROVENTIL HFA;VENTOLIN HFA) 108 (90 BASE) MCG/ACT inhaler Inhale 2 puffs into the lungs every 6 (six) hours as needed for wheezing or shortness of breath.  1 Inhaler  2  . Cholecalciferol (VITAMIN D) 2000 UNITS tablet Take 2,000 Units by mouth daily.      . furosemide (LASIX) 80 MG tablet Take 80 MG twice daily; may extra 40 mg daily for wt gain up 3 lb x 1 day      . gabapentin (NEURONTIN) 100 MG capsule Take 100 mg by mouth daily.      Marland Kitchen HYDROcodone-acetaminophen (NORCO) 5-325 MG per tablet Take 1 tablet by mouth every 6 (six) hours as needed.  10 tablet  0  . Iron-Folic Acid-Vit Y19 (IRON FORMULA) 27-400-100 MG-MCG-MCG CAPS Take 1 tablet by mouth daily.      Marland Kitchen lisinopril (PRINIVIL,ZESTRIL) 40 MG tablet Take 1 tablet (40 mg total) by mouth daily.  30 tablet  12  . metoprolol succinate (TOPROL-XL) 50 MG 24 hr tablet Take 1 tablet (50 mg total) by mouth as directed. Take 1 and 1/2  tabs daily = 75 mg daily  45 tablet  11  . oxyCODONE-acetaminophen (PERCOCET/ROXICET) 5-325 MG per tablet Take 1 tablet by mouth every 4 (four) hours as needed for severe pain.      . potassium chloride SA (K-DUR,KLOR-CON) 20 MEQ tablet Take 4 tablets (80 mEq total) by mouth 2 (two) times daily.       No current facility-administered medications for this visit.    Allergies:   Review of patient's allergies indicates no known allergies.   Social History:  The patient  reports that she has never smoked. She has never used smokeless tobacco. She reports that she drinks about .6 ounces of alcohol per week. She reports that she does not use illicit drugs.   Family History:  The patient's family history includes CAD in her mother.   ROS:  Please see the history of present illness.      All other systems reviewed and negative.   PHYSICAL EXAM: VS:  BP 130/88  Pulse 77  Ht 5'  6.5" (1.689 m)  Wt 252 lb (114.306 kg)  BMI 40.07 kg/m2 Well nourished, well developed, in no acute distress HEENT: normal Neck:  I cannot apprecieate JVD  Cardiac:  normal S1, S2; RRR; no murmur Lungs:  Clear to ausc bilaterally, no wheezing, no rales Abd: somewhat distended, nontender Ext: no edema Skin: warm and dry Neuro:  CNs 2-12 intact, no focal abnormalities noted  EKG:   NSR, HR 75, LBBB, PVCs  ASSESSMENT AND PLAN:  1. Chronic Combined Systolic and Diastolic CHF:  She continues to have symptoms of volume overload.  I suspect some of her dyspnea is related to obesity.  However, she has had objective evidence of volume overload in the past and her weight is unchanged with large doses of diuretics.  I reviewed her case with Dr. Kirk Ruths today.  I will try to further titrate her CHF medications.  I will increase Toprol Xl to 100 mg QD.  Decrease K+ to 60 BID.  Add Spironolactone 25 mg QD.  Check BMET, BNP today and repeat BMET in 4-5 days and again a week later (to keep a close eye on renal fxn and K+).  Refer to CHF clinic for further recommendations if any. 2. Non-Ischemic Cardiomyopathy:  Continue ACEI and beta blocker.  Add spironolactone as noted.  Repeat echo to recheck LVF.  3. Hypertension:   Better controlled.  4. Sleep Apnea:  Mild by recent sleep study.  Recommend weight loss initially.   5. Disposition:  Refer to CHF Clinic.   Signed, Richardson Dopp, PA-C  09/04/2013 4:24 PM

## 2013-09-04 NOTE — Patient Instructions (Signed)
You will need lab work on Friday April 16th bmp & bnp                                          Monday April 20th  Bmp                                         Monday April 27th  Bmp  We will call you with your results   Medication changes:   INCREASE  Toprol to 100mg  daily                                      DECREASE:  Potassium to 44meq twice a day                                      START:   Spironolactone 25 mg daily  Your physician has requested that you have an echocardiogram. Echocardiography is a painless test that uses sound waves to create images of your heart. It provides your doctor with information about the size and shape of your heart and how well your heart's chambers and valves are working. This procedure takes approximately one hour. There are no restrictions for this procedure.  You have been referred to CHF clinic. You will need an appointment within the next 2 weeks

## 2013-09-05 ENCOUNTER — Other Ambulatory Visit (INDEPENDENT_AMBULATORY_CARE_PROVIDER_SITE_OTHER): Payer: BC Managed Care – PPO

## 2013-09-05 DIAGNOSIS — I10 Essential (primary) hypertension: Secondary | ICD-10-CM

## 2013-09-05 DIAGNOSIS — I5042 Chronic combined systolic (congestive) and diastolic (congestive) heart failure: Secondary | ICD-10-CM

## 2013-09-05 DIAGNOSIS — I428 Other cardiomyopathies: Secondary | ICD-10-CM

## 2013-09-05 LAB — BASIC METABOLIC PANEL
BUN: 15 mg/dL (ref 6–23)
CO2: 30 mEq/L (ref 19–32)
Calcium: 9 mg/dL (ref 8.4–10.5)
Chloride: 105 mEq/L (ref 96–112)
Creatinine, Ser: 1 mg/dL (ref 0.4–1.2)
GFR: 67.05 mL/min (ref >60.00)
Glucose, Bld: 91 mg/dL (ref 70–99)
POTASSIUM: 3.5 meq/L (ref 3.5–5.1)
SODIUM: 141 meq/L (ref 135–145)

## 2013-09-05 LAB — BRAIN NATRIURETIC PEPTIDE: PRO B NATRI PEPTIDE: 343 pg/mL — AB (ref 0.0–100.0)

## 2013-09-08 ENCOUNTER — Other Ambulatory Visit (INDEPENDENT_AMBULATORY_CARE_PROVIDER_SITE_OTHER): Payer: BC Managed Care – PPO

## 2013-09-08 DIAGNOSIS — I5042 Chronic combined systolic (congestive) and diastolic (congestive) heart failure: Secondary | ICD-10-CM

## 2013-09-09 ENCOUNTER — Telehealth: Payer: Self-pay | Admitting: *Deleted

## 2013-09-09 LAB — BASIC METABOLIC PANEL
BUN: 22 mg/dL (ref 6–23)
CHLORIDE: 101 meq/L (ref 96–112)
CO2: 29 mEq/L (ref 19–32)
Calcium: 9.8 mg/dL (ref 8.4–10.5)
Creatinine, Ser: 1.1 mg/dL (ref 0.4–1.2)
GFR: 56.61 mL/min — ABNORMAL LOW (ref >60.00)
Glucose, Bld: 100 mg/dL — ABNORMAL HIGH (ref 70–99)
POTASSIUM: 3.6 meq/L (ref 3.5–5.1)
Sodium: 139 mEq/L (ref 135–145)

## 2013-09-09 NOTE — Telephone Encounter (Signed)
pt notified about lab results with verbal understanding  

## 2013-09-15 ENCOUNTER — Other Ambulatory Visit: Payer: BC Managed Care – PPO

## 2013-09-16 ENCOUNTER — Telehealth: Payer: Self-pay | Admitting: *Deleted

## 2013-09-16 NOTE — Telephone Encounter (Signed)
Pt was calling because she  feels like she is feeling up with fluids; her abdomen, lungs. Pt is SOB with mild activity. She would like to know what to do. Pt states she is scheduled for an Echo on 4/30 also she has an appointment for CHF Clinic 09/22/13. She states needs something done now.

## 2013-09-16 NOTE — Telephone Encounter (Signed)
Richardson Dopp PA aware of pt's symptoms and recommends for pt to take an extra 40 mg of lasix this PM for a total of 120 mg and 20 mEq potassium , pt is to take 120 mg lasix twice a day and an extra 20 mEq potasium tomorrow. Pt is to call the office tomorrow  to see how she is doing. Pt to keep the appointment for an echo on 4/30 and the appointment with the CHF clinic scheduled for 5/4/215 at 11:20 AM. Pt verbalized understanding.

## 2013-09-17 ENCOUNTER — Encounter (HOSPITAL_COMMUNITY): Payer: Self-pay | Admitting: Emergency Medicine

## 2013-09-17 ENCOUNTER — Emergency Department (HOSPITAL_COMMUNITY): Payer: BC Managed Care – PPO

## 2013-09-17 ENCOUNTER — Inpatient Hospital Stay (HOSPITAL_COMMUNITY)
Admission: EM | Admit: 2013-09-17 | Discharge: 2013-09-24 | DRG: 287 | Disposition: A | Discharge status: Home Health | Payer: BC Managed Care – PPO | Attending: Cardiovascular Disease | Admitting: Cardiovascular Disease

## 2013-09-17 DIAGNOSIS — I5023 Acute on chronic systolic (congestive) heart failure: Secondary | ICD-10-CM | POA: Diagnosis present

## 2013-09-17 DIAGNOSIS — I5042 Chronic combined systolic (congestive) and diastolic (congestive) heart failure: Secondary | ICD-10-CM

## 2013-09-17 DIAGNOSIS — I509 Heart failure, unspecified: Secondary | ICD-10-CM | POA: Diagnosis present

## 2013-09-17 DIAGNOSIS — Z9221 Personal history of antineoplastic chemotherapy: Secondary | ICD-10-CM

## 2013-09-17 DIAGNOSIS — Z923 Personal history of irradiation: Secondary | ICD-10-CM

## 2013-09-17 DIAGNOSIS — E669 Obesity, unspecified: Secondary | ICD-10-CM | POA: Diagnosis present

## 2013-09-17 DIAGNOSIS — N289 Disorder of kidney and ureter, unspecified: Secondary | ICD-10-CM

## 2013-09-17 DIAGNOSIS — I447 Left bundle-branch block, unspecified: Secondary | ICD-10-CM | POA: Diagnosis present

## 2013-09-17 DIAGNOSIS — I5043 Acute on chronic combined systolic (congestive) and diastolic (congestive) heart failure: Principal | ICD-10-CM | POA: Diagnosis present

## 2013-09-17 DIAGNOSIS — I10 Essential (primary) hypertension: Secondary | ICD-10-CM | POA: Diagnosis present

## 2013-09-17 DIAGNOSIS — R570 Cardiogenic shock: Secondary | ICD-10-CM

## 2013-09-17 DIAGNOSIS — I429 Cardiomyopathy, unspecified: Secondary | ICD-10-CM

## 2013-09-17 DIAGNOSIS — I959 Hypotension, unspecified: Secondary | ICD-10-CM | POA: Diagnosis not present

## 2013-09-17 DIAGNOSIS — G4733 Obstructive sleep apnea (adult) (pediatric): Secondary | ICD-10-CM

## 2013-09-17 DIAGNOSIS — I428 Other cardiomyopathies: Secondary | ICD-10-CM

## 2013-09-17 DIAGNOSIS — N179 Acute kidney failure, unspecified: Secondary | ICD-10-CM | POA: Diagnosis not present

## 2013-09-17 DIAGNOSIS — Z6841 Body Mass Index (BMI) 40.0 and over, adult: Secondary | ICD-10-CM

## 2013-09-17 DIAGNOSIS — C8589 Other specified types of non-Hodgkin lymphoma, extranodal and solid organ sites: Secondary | ICD-10-CM | POA: Diagnosis present

## 2013-09-17 DIAGNOSIS — Z79899 Other long term (current) drug therapy: Secondary | ICD-10-CM

## 2013-09-17 HISTORY — DX: Other cardiomyopathies: I42.8

## 2013-09-17 LAB — CBC WITH DIFFERENTIAL/PLATELET
BASOS ABS: 0 10*3/uL (ref 0.0–0.1)
Basophils Relative: 0 % (ref 0–1)
Eosinophils Absolute: 0.2 10*3/uL (ref 0.0–0.7)
Eosinophils Relative: 3 % (ref 0–5)
HEMATOCRIT: 38.7 % (ref 36.0–46.0)
Hemoglobin: 12.7 g/dL (ref 12.0–15.0)
LYMPHS PCT: 23 % (ref 12–46)
Lymphs Abs: 1.5 10*3/uL (ref 0.7–4.0)
MCH: 29.1 pg (ref 26.0–34.0)
MCHC: 32.8 g/dL (ref 30.0–36.0)
MCV: 88.6 fL (ref 78.0–100.0)
MONO ABS: 0.3 10*3/uL (ref 0.1–1.0)
MONOS PCT: 4 % (ref 3–12)
NEUTROS ABS: 4.5 10*3/uL (ref 1.7–7.7)
Neutrophils Relative %: 70 % (ref 43–77)
Platelets: 263 10*3/uL (ref 150–400)
RBC: 4.37 MIL/uL (ref 3.87–5.11)
RDW: 13.2 % (ref 11.5–15.5)
WBC: 6.5 10*3/uL (ref 4.0–10.5)

## 2013-09-17 LAB — URINALYSIS, ROUTINE W REFLEX MICROSCOPIC
Bilirubin Urine: NEGATIVE
Glucose, UA: NEGATIVE mg/dL
Hgb urine dipstick: NEGATIVE
Ketones, ur: NEGATIVE mg/dL
Leukocytes, UA: NEGATIVE
NITRITE: NEGATIVE
Protein, ur: NEGATIVE mg/dL
Specific Gravity, Urine: 1.016 (ref 1.005–1.030)
UROBILINOGEN UA: 1 mg/dL (ref 0.0–1.0)
pH: 6 (ref 5.0–8.0)

## 2013-09-17 LAB — COMPREHENSIVE METABOLIC PANEL
ALT: 24 U/L (ref 0–35)
AST: 26 U/L (ref 0–37)
Albumin: 3.4 g/dL — ABNORMAL LOW (ref 3.5–5.2)
Alkaline Phosphatase: 70 U/L (ref 39–117)
BILIRUBIN TOTAL: 0.6 mg/dL (ref 0.3–1.2)
BUN: 14 mg/dL (ref 6–23)
CHLORIDE: 105 meq/L (ref 96–112)
CO2: 23 meq/L (ref 19–32)
Calcium: 8.9 mg/dL (ref 8.4–10.5)
Creatinine, Ser: 0.96 mg/dL (ref 0.50–1.10)
GFR calc Af Amer: 81 mL/min — ABNORMAL LOW (ref >90)
GFR calc non Af Amer: 70 mL/min — ABNORMAL LOW (ref >90)
Glucose, Bld: 99 mg/dL (ref 70–99)
Potassium: 4 mEq/L (ref 3.7–5.3)
Sodium: 142 mEq/L (ref 137–147)
Total Protein: 6.8 g/dL (ref 6.0–8.3)

## 2013-09-17 LAB — CREATININE, SERUM
Creatinine, Ser: 0.98 mg/dL (ref 0.50–1.10)
GFR calc Af Amer: 79 mL/min — ABNORMAL LOW (ref >90)
GFR, EST NON AFRICAN AMERICAN: 68 mL/min — AB (ref >90)

## 2013-09-17 LAB — TROPONIN I: Troponin I: <0.3 ng/mL (ref <0.30)

## 2013-09-17 LAB — CBC
HEMATOCRIT: 39.3 % (ref 36.0–46.0)
Hemoglobin: 13.1 g/dL (ref 12.0–15.0)
MCH: 29.4 pg (ref 26.0–34.0)
MCHC: 33.3 g/dL (ref 30.0–36.0)
MCV: 88.3 fL (ref 78.0–100.0)
Platelets: 269 10*3/uL (ref 150–400)
RBC: 4.45 MIL/uL (ref 3.87–5.11)
RDW: 13.4 % (ref 11.5–15.5)
WBC: 8 10*3/uL (ref 4.0–10.5)

## 2013-09-17 LAB — PREGNANCY, URINE: Preg Test, Ur: NEGATIVE

## 2013-09-17 LAB — LIPASE, BLOOD: Lipase: 14 U/L (ref 11–59)

## 2013-09-17 LAB — PRO B NATRIURETIC PEPTIDE: Pro B Natriuretic peptide (BNP): 2528 pg/mL — ABNORMAL HIGH (ref 0–125)

## 2013-09-17 LAB — D-DIMER, QUANTITATIVE (NOT AT ARMC): D DIMER QUANT: 0.27 ug{FEU}/mL (ref 0.00–0.48)

## 2013-09-17 MED ORDER — FUROSEMIDE 10 MG/ML IJ SOLN: 80.0000 mg | Freq: Once | INTRAMUSCULAR | Status: DC

## 2013-09-17 MED ORDER — GABAPENTIN 100 MG PO CAPS
100.0000 mg | ORAL_CAPSULE | Freq: Every day | ORAL | Status: DC
  Administered 2013-09-18 – 2013-09-24 (×6): 100 mg via ORAL
  Filled 2013-09-17 (×7): qty 1

## 2013-09-17 MED ORDER — METOPROLOL SUCCINATE ER 100 MG PO TB24
100.0000 mg | ORAL_TABLET | Freq: Every day | ORAL | Status: DC
Start: 2013-09-18 — End: 2013-09-19
  Administered 2013-09-18 – 2013-09-19 (×2): 100 mg via ORAL
  Filled 2013-09-17 (×2): qty 1

## 2013-09-17 MED ORDER — ACETAMINOPHEN 325 MG PO TABS
650.0000 mg | ORAL_TABLET | ORAL | Status: DC | PRN
  Administered 2013-09-17 – 2013-09-22 (×3): 650 mg via ORAL
  Filled 2013-09-17 (×3): qty 2

## 2013-09-17 MED ORDER — ENOXAPARIN SODIUM 40 MG/0.4ML ~~LOC~~ SOLN
40.0000 mg | SUBCUTANEOUS | Status: DC
  Administered 2013-09-17: 40 mg via SUBCUTANEOUS
  Filled 2013-09-17 (×2): qty 0.4

## 2013-09-17 MED ORDER — SODIUM CHLORIDE 0.9 % IJ SOLN: 3.0000 mL | INTRAMUSCULAR | Status: DC | PRN

## 2013-09-17 MED ORDER — SODIUM CHLORIDE 0.9 % IV SOLN: 250.0000 mL | INTRAVENOUS | Status: DC | PRN

## 2013-09-17 MED ORDER — ALBUTEROL SULFATE (2.5 MG/3ML) 0.083% IN NEBU: 2.5000 mg | INHALATION_SOLUTION | Freq: Four times a day (QID) | RESPIRATORY_TRACT | Status: DC | PRN

## 2013-09-17 MED ORDER — ACETAMINOPHEN 325 MG PO TABS
325.0000 mg | ORAL_TABLET | Freq: Once | ORAL | Status: AC
  Administered 2013-09-17: 325 mg via ORAL
  Filled 2013-09-17: qty 1

## 2013-09-17 MED ORDER — LISINOPRIL 40 MG PO TABS
40.0000 mg | ORAL_TABLET | Freq: Every day | ORAL | Status: DC
  Administered 2013-09-18 – 2013-09-19 (×2): 40 mg via ORAL
  Filled 2013-09-17 (×3): qty 1

## 2013-09-17 MED ORDER — FUROSEMIDE 10 MG/ML IJ SOLN
40.0000 mg | Freq: Once | INTRAMUSCULAR | Status: AC
  Administered 2013-09-17: 40 mg via INTRAVENOUS
  Filled 2013-09-17: qty 4

## 2013-09-17 MED ORDER — FUROSEMIDE 10 MG/ML IJ SOLN
80.0000 mg | Freq: Three times a day (TID) | INTRAMUSCULAR | Status: DC
  Administered 2013-09-17 – 2013-09-19 (×6): 80 mg via INTRAVENOUS
  Filled 2013-09-17 (×9): qty 8

## 2013-09-17 MED ORDER — ALBUTEROL SULFATE HFA 108 (90 BASE) MCG/ACT IN AERS: 2.0000 | INHALATION_SPRAY | Freq: Four times a day (QID) | RESPIRATORY_TRACT | Status: DC | PRN

## 2013-09-17 MED ORDER — SPIRONOLACTONE 25 MG PO TABS
25.0000 mg | ORAL_TABLET | Freq: Every day | ORAL | Status: DC
  Administered 2013-09-18: 25 mg via ORAL
  Filled 2013-09-17 (×3): qty 1

## 2013-09-17 MED ORDER — SODIUM CHLORIDE 0.9 % IJ SOLN
3.0000 mL | Freq: Two times a day (BID) | INTRAMUSCULAR | Status: DC
  Administered 2013-09-17 – 2013-09-23 (×6): 3 mL via INTRAVENOUS

## 2013-09-17 MED ORDER — FUROSEMIDE 10 MG/ML IJ SOLN: 40.0000 mg | Freq: Once | INTRAMUSCULAR | Status: DC

## 2013-09-17 MED ORDER — POTASSIUM CHLORIDE CRYS ER 20 MEQ PO TBCR
60.0000 meq | EXTENDED_RELEASE_TABLET | Freq: Two times a day (BID) | ORAL | Status: DC
  Administered 2013-09-17 – 2013-09-19 (×5): 60 meq via ORAL
  Filled 2013-09-17 (×8): qty 3

## 2013-09-17 NOTE — ED Notes (Signed)
Patient states she took 120 mg of lasix this morning

## 2013-09-17 NOTE — ED Provider Notes (Signed)
CSN: [id number]     Arrival date & time 09/17/13  [id number] History   First MD Initiated Contact with Patient 09/17/13 0932     Chief Complaint  Patient presents with  . Shortness of Breath     (Consider location/radiation/quality/duration/timing/severity/associated sxs/prior Treatment) The history is provided by the patient. No language interpreter was used.  Nandini Pensabene is a 86 show female with past medical history of non-Hodgkin's lymphoma diagnosed in 2000 and with chemotherapy in 2012, hypertension, respiratory failure, congestive heart failure, OSA presenting to the ED with ongoing shortness of breath. Patient reports that the shortness of breath is constant worse with exertion. Stated that she's been having intermittent chest pain described as a heaviness, pressure sensation across her entire chest. Stated that she's been also experiencing abdominal pain, described the abdominal pain as a bloating, pressure sensation that is generalized. Patient reports she feels as if she has just drank a gallon of fluid. Reported that last night she was unable to sleep in her bed with propped up with pillows, reported that she needed to sleep in her couch sitting upright-reported that she still had shortness of breath and discomfort. Stated that she took approximately 20 mg of Lasix-to 80 mg and one 40 mg this morning - reported that she did not have any urine output, reported that she took her medications at approximately 6:30 AM this morning with no urine output since then. Patient reports that her base weight is 220-225 pounds. Denied fever, nausea, vomiting, numbness, tingling, leg swelling. PCP none Cardiology Dr. Stanford Breed  Past Medical History  Diagnosis Date  . Lymphoma     s/p chemotherapy 2012, radiation in 2000  . Hypertension   . Multiple thyroid nodules   . Respiratory failure     was admitted [facility] and was on ventilator  . H/O angiography     unsure of any PCI  . CHF  (congestive heart failure)   . OSA (obstructive sleep apnea)     a. mild by sleep study 08/2013 - trial of weight loss;  refer to sleep med if unsucessful  . Non-ischemic cardiomyopathy    Past Surgical History  Procedure Laterality Date  . Tubal ligation    . Femur surgery    . Knee surgery    . Cholecystectomy    . Porta cath insertion      2 years   Family History  Problem Relation Age of Onset  . CAD Mother    History  Substance Use Topics  . Smoking status: Never Smoker   . Smokeless tobacco: Never Used  . Alcohol Use: 0.6 oz/week    1 Glasses of wine per week     Comment: Occasional   OB History   Grav Para Term Preterm Abortions TAB SAB Ect Mult Living                 Review of Systems  Constitutional: Negative for fever and chills.  Respiratory: Positive for shortness of breath. Negative for chest tightness.   Cardiovascular: Positive for chest pain. Negative for leg swelling.  Gastrointestinal: Positive for abdominal pain. Negative for nausea and vomiting.  Musculoskeletal: Negative for back pain and neck pain.  Neurological: Positive for headaches. Negative for weakness and numbness.  All other systems reviewed and are negative.     Allergies  Review of patient's allergies indicates no known allergies.  Home Medications   Prior to Admission medications   Medication Sig Start Date End Date  Taking? Authorizing Provider  albuterol (PROVENTIL HFA;VENTOLIN HFA) 108 (90 BASE) MCG/ACT inhaler Inhale 2 puffs into the lungs every 6 (six) hours as needed for wheezing or shortness of breath. 05/27/13   Oswald Hillock, MD  Cholecalciferol (VITAMIN D) 2000 UNITS tablet Take 2,000 Units by mouth daily.    Historical Provider, MD  furosemide (LASIX) 80 MG tablet Take 80 MG twice daily; may extra 40 mg daily for wt gain up 3 lb x 1 day 08/18/13   Liliane Shi, PA-C  gabapentin (NEURONTIN) 100 MG capsule Take 100 mg by mouth daily.    Historical Provider, MD   HYDROcodone-acetaminophen (NORCO) 5-325 MG per tablet Take 1 tablet by mouth every 6 (six) hours as needed. 05/27/13   Oswald Hillock, MD  Iron-Folic Acid-Vit J62 (IRON FORMULA) 27-400-100 MG-MCG-MCG CAPS Take 1 tablet by mouth daily.    Historical Provider, MD  lisinopril (PRINIVIL,ZESTRIL) 40 MG tablet Take 1 tablet (40 mg total) by mouth daily. 06/20/13   Lelon Perla, MD  metoprolol succinate (TOPROL-XL) 100 MG 24 hr tablet Take 1 tablet (100 mg total) by mouth daily. 09/04/13   Liliane Shi, PA-C  oxyCODONE-acetaminophen (PERCOCET/ROXICET) 5-325 MG per tablet Take 1 tablet by mouth every 4 (four) hours as needed for severe pain.    Historical Provider, MD  potassium chloride SA (K-DUR,KLOR-CON) 20 MEQ tablet Take 3 tablets (60 mEq total) by mouth 2 (two) times daily. 09/04/13   Liliane Shi, PA-C  spironolactone (ALDACTONE) 25 MG tablet Take 1 tablet (25 mg total) by mouth daily. 09/04/13   Liliane Shi, PA-C   BP 132/98  Pulse 82  Temp(Src) 97.8 F (36.6 C) (Oral)  Resp 33  Ht 5' 6.5" (1.689 m)  Wt 258 lb (117.028 kg)  BMI 41.02 kg/m2  SpO2 94% Physical Exam  Nursing note and vitals reviewed. Constitutional: She is oriented to person, place, and time. She appears well-developed and well-nourished. No distress.  HENT:  Head: Normocephalic and atraumatic.  Mouth/Throat: Oropharynx is clear and moist. No oropharyngeal exudate.  Eyes: Conjunctivae and EOM are normal. Pupils are equal, round, and reactive to light. Right eye exhibits no discharge. Left eye exhibits no discharge.  Neck: Normal range of motion. Neck supple. No tracheal deviation present.  Cardiovascular: Normal rate, regular rhythm and normal heart sounds.   Pulses:      Radial pulses are 2+ on the right side, and 2+ on the left side.       Dorsalis pedis pulses are 2+ on the right side, and 2+ on the left side.  Negative leg swelling or pitting edema identified to the lower extremities bilaterally  Pulmonary/Chest:  She has no rales. She exhibits no tenderness.  Increased effort Patient is unable to finish a sentence without having to catch her breath Decreased breath sounds to upper and lower lobes bilaterally  Abdominal: Soft. Bowel sounds are normal. She exhibits no distension, no fluid wave and no ascites. There is generalized tenderness. There is no rebound and no guarding.  Obese Bowel sounds are normal active in all 4 quadrants Discomfort upon palpation to the abdomen in all 4 quadrants -generalized tenderness with negative guarding Negative fluid wave  Musculoskeletal: Normal range of motion.  Lymphadenopathy:    She has no cervical adenopathy.  Neurological: She is alert and oriented to person, place, and time. No cranial nerve deficit. She exhibits normal muscle tone. Coordination normal.  Skin: Skin is warm and dry. No rash noted. She  is not diaphoretic. No erythema.  Psychiatric: She has a normal mood and affect. Her behavior is normal. Thought content normal.    ED Course  Procedures (including critical care time)  11:19 AM This provider spoke with Wannetta Sender - discussed case, history, presentation, imaging and labs. Cardiology to come and assess patient.  Results for orders placed during the hospital encounter of 09/17/13  TROPONIN I      Result Value Ref Range   Troponin I <0.30  <0.30 ng/mL  CBC WITH DIFFERENTIAL      Result Value Ref Range   WBC 6.5  4.0 - 10.5 K/uL   RBC 4.37  3.87 - 5.11 MIL/uL   Hemoglobin 12.7  12.0 - 15.0 g/dL   HCT 38.7  36.0 - 46.0 %   MCV 88.6  78.0 - 100.0 fL   MCH 29.1  26.0 - 34.0 pg   MCHC 32.8  30.0 - 36.0 g/dL   RDW 13.2  11.5 - 15.5 %   Platelets 263  150 - 400 K/uL   Neutrophils Relative % 70  43 - 77 %   Neutro Abs 4.5  1.7 - 7.7 K/uL   Lymphocytes Relative 23  12 - 46 %   Lymphs Abs 1.5  0.7 - 4.0 K/uL   Monocytes Relative 4  3 - 12 %   Monocytes Absolute 0.3  0.1 - 1.0 K/uL   Eosinophils Relative 3  0 - 5 %   Eosinophils Absolute 0.2   0.0 - 0.7 K/uL   Basophils Relative 0  0 - 1 %   Basophils Absolute 0.0  0.0 - 0.1 K/uL  COMPREHENSIVE METABOLIC PANEL      Result Value Ref Range   Sodium 142  137 - 147 mEq/L   Potassium 4.0  3.7 - 5.3 mEq/L   Chloride 105  96 - 112 mEq/L   CO2 23  19 - 32 mEq/L   Glucose, Bld 99  70 - 99 mg/dL   BUN 14  6 - 23 mg/dL   Creatinine, Ser 0.96  0.50 - 1.10 mg/dL   Calcium 8.9  8.4 - 10.5 mg/dL   Total Protein 6.8  6.0 - 8.3 g/dL   Albumin 3.4 (*) 3.5 - 5.2 g/dL   AST 26  0 - 37 U/L   ALT 24  0 - 35 U/L   Alkaline Phosphatase 70  39 - 117 U/L   Total Bilirubin 0.6  0.3 - 1.2 mg/dL   GFR calc non Af Amer 70 (*) >90 mL/min   GFR calc Af Amer 81 (*) >90 mL/min  PRO B NATRIURETIC PEPTIDE      Result Value Ref Range   Pro B Natriuretic peptide (BNP) 2528.0 (*) 0 - 125 pg/mL  LIPASE, BLOOD      Result Value Ref Range   Lipase 14  11 - 59 U/L  D-DIMER, QUANTITATIVE      Result Value Ref Range   D-Dimer, Quant 0.27  0.00 - 0.48 ug/mL-FEU    Labs Review Labs Reviewed  COMPREHENSIVE METABOLIC PANEL - Abnormal; Notable for the following:    Albumin 3.4 (*)    GFR calc non Af Amer 70 (*)    GFR calc Af Amer 81 (*)    All other components within normal limits  PRO B NATRIURETIC PEPTIDE - Abnormal; Notable for the following:    Pro B Natriuretic peptide (BNP) 2528.0 (*)    All other components within normal limits  TROPONIN I  CBC WITH DIFFERENTIAL  LIPASE, BLOOD  D-DIMER, QUANTITATIVE  PREGNANCY, URINE  URINALYSIS, ROUTINE W REFLEX MICROSCOPIC    Imaging Review Dg Chest Port 1 View  09/17/2013   CLINICAL DATA:  Chest pain and shortness of breath P  EXAM: PORTABLE CHEST - 1 VIEW  COMPARISON:  08/17/2013  FINDINGS: Cardiac silhouette remains enlarged, unchanged. Left subclavian Port-A-Cath remains in place with tip overlying the lower SVC. Pulmonary vascular congestion and bilateral perihilar and bibasilar lung opacities have minimally improved since the prior study. No definite  pleural effusion or pneumothorax is identified. No acute osseous abnormality is seen.  IMPRESSION: Pulmonary edema, minimally improved from prior.   Electronically Signed   By: Logan Bores   On: 09/17/2013 09:56     EKG Interpretation   Date/Time:  Wednesday September 17 2013 09:44:49 EDT Ventricular Rate:  93 PR Interval:  145 QRS Duration: 156 QT Interval:  423 QTC Calculation: 526 R Axis:   -54 Text Interpretation:  Sinus tachycardia Ventricular premature complex  Probable left atrial enlargement Left bundle branch block similar to prior  EKG Confirmed by BELFI  MD, MELANIE ([id number]) on 09/17/2013 10:38:19 AM      MDM   Final diagnoses:  Acute on chronic systolic and diastolic heart failure, NYHA class 4  Hypertension  NICM (nonischemic cardiomyopathy)   Medications  furosemide (LASIX) injection 40 mg (not administered)  acetaminophen (TYLENOL) tablet 325 mg (not administered)   Filed Vitals:   09/17/13 1014 09/17/13 1115 09/17/13 1145 09/17/13 1212  BP:  132/98    Pulse:  85 82   Temp:      TempSrc:      Resp:  32 33   Height: 5' 6.5" (1.689 m)     Weight: 258 lb (117.028 kg)     SpO2:  97% 96% 94%    This provider reviewed patient's chart. Patient was seen and assessed in ED setting on 08/17/2013 regarding shortness of breath and increased in weekly with abdominal pain. Patient is getting 80 mg of Lasix IV with 4 L of urine output and discharged home with close followup with cardiologist. Patient is followed by Dr. Stanford Breed regarding congestive heart failure. Patient was seen and assessed by cardiology office on 09/04/2013 by Dr. Kathlen Mody were Toprol was increased to 100 mg daily, potassium decreased to 60 mEq 2 times a day, Aldactone 25 mEq daily. A phone call conversation occurred yesterday, 09/08/2013 with Dr. Kathlen Mody regarding ongoing shortness of breath. As per requested cardiologist recommended 40 mg of extra Lasix to be taken yesterday for total of 120 mg as well as 20  mEq of potassium. Patient was recommended sacral 120 mg of Lasix today as well as 20 mEq of potassium. Patient has an appointment with echocardiogram for 09/18/2013. Appointment with CHF clinic on 09/22/2013. Noted 6 pound increase in weight from 09/04/2013 to today. Patient was 252 pounds on 09/04/2013, patient is 258 pounds today.  EKG noted sinus tachycardia with a ventricular premature complexes, probable left atrial enlargement with a left bundle branch block, heart rate 93 beats per minute-EKG similar to prior EKG tracing. Troponin negative elevation. D-dimer negative elevation. CBC negative elevated white blood cell count - negative leukocytosis or left shift. CMP kidneys and liver functioning well. BNP elevated at 2528.0 - when compared to one week ago BNP was 343.0. Lipase negative elevation. Chest x-ray identified pulmonary edema with minimal improvement from prior x-ray. Patient seen and assessed by cardiology. Patient to be admitted to cardiology services-admitted to  telemetry as inpatient for IV diuresis. Discussed plan with admission with patient, agreed and understood. Patient stable for transfer.  Jamse Mead, PA-C 09/17/13 1744

## 2013-09-17 NOTE — ED Notes (Signed)
Pt taken back to room via wheelchair with family in tow; pt instructed to undress and place on gown; RN aware

## 2013-09-17 NOTE — ED Notes (Signed)
Report attempted 

## 2013-09-17 NOTE — ED Notes (Signed)
Portable xray at bedside.

## 2013-09-17 NOTE — ED Notes (Signed)
Carlynn Purl, PA at bedside

## 2013-09-17 NOTE — H&P (Signed)
Patient ID: Darlene Pierce MRN: [id number] DOB/AGE: 1965/08/24 48 y.o. Admit date: 09/17/2013  Primary Cardiologist: Stanford Breed  HPI: 48 yo female with history of non-ischemic cardiomyopathy, HTN, non-Hodgkin's Lymphoma (radiation in 2000, chemotherapy in 7408), chronic systolic CHF who presented to the ED today with c/o weight gain and SOB. She is followed as an outpatient by Dr. Stanford Breed.  She was admitted January 2015 with acute CHF and diuresed well with IV Lasix. She has been seen several times as an outpatient since then and attempts have been made to adjust her po Lasix to prevent hospitalization. Cardiac cath at [facility] 12/08/11 with normal coronary arteries. Her cardiomyopathy is presumed to be due to HTN vs chemotherapy. Echo (05/26/2013): Mild LVH, EF 45-50%, Gr 1 DD, mild LAE, RVSP 27.   Over the last few weeks she has noticed a steady increase in weight with abdominal distention. Increase in dyspnea. No chest pain. No LE edema. BNP is 2500 today (343 12 days ago). Chest x-ray with evidence of pulmonary edema.   Review of systems complete and found to be negative unless listed above   Past Medical History  Diagnosis Date  . Lymphoma     s/p chemotherapy 2012, radiation in 2000  . Hypertension   . Multiple thyroid nodules   . Respiratory failure     was admitted in [facility] and was on ventilator  . H/O angiography     unsure of any PCI  . CHF (congestive heart failure)   . OSA (obstructive sleep apnea)     a. mild by sleep study 08/2013 - trial of weight loss;  refer to sleep med if unsucessful    Family History  Problem Relation Age of Onset  . CAD Mother     History   Social History  . Marital Status: Single    Spouse Name: N/A    Number of Children: 4  . Years of Education: N/A   Occupational History  .     Social History Main Topics  . Smoking status: Never Smoker   . Smokeless tobacco: Never Used  . Alcohol Use: 0.6 oz/week    1 Glasses of  wine per week     Comment: Occasional  . Drug Use: No  . Sexual Activity: Not on file   Other Topics Concern  . Not on file   Social History Narrative  . No narrative on file    Past Surgical History  Procedure Laterality Date  . Tubal ligation    . Femur surgery    . Knee surgery    . Cholecystectomy    . Porta cath insertion      2 years    No Known Allergies  Prior to Admission Meds:  No current facility-administered medications for this encounter.   Current Outpatient Prescriptions  Medication Sig Dispense Refill  . albuterol (PROVENTIL HFA;VENTOLIN HFA) 108 (90 BASE) MCG/ACT inhaler Inhale 2 puffs into the lungs every 6 (six) hours as needed for wheezing or shortness of breath.  1 Inhaler  2  . Cholecalciferol (VITAMIN D) 2000 UNITS tablet Take 2,000 Units by mouth daily.      . furosemide (LASIX) 80 MG tablet Take 80 MG twice daily; may extra 40 mg daily for wt gain up 3 lb x 1 day      . gabapentin (NEURONTIN) 100 MG capsule Take 100 mg by mouth daily.      Marland Kitchen HYDROcodone-acetaminophen (NORCO) 5-325 MG per tablet Take  1 tablet by mouth every 6 (six) hours as needed.  10 tablet  0  . Iron-Folic Acid-Vit D42 (IRON FORMULA) 27-400-100 MG-MCG-MCG CAPS Take 1 tablet by mouth daily.      Marland Kitchen lisinopril (PRINIVIL,ZESTRIL) 40 MG tablet Take 1 tablet (40 mg total) by mouth daily.  30 tablet  12  . metoprolol succinate (TOPROL-XL) 100 MG 24 hr tablet Take 1 tablet (100 mg total) by mouth daily.  30 tablet  6  . potassium chloride SA (K-DUR,KLOR-CON) 20 MEQ tablet Take 3 tablets (60 mEq total) by mouth 2 (two) times daily.      Marland Kitchen spironolactone (ALDACTONE) 25 MG tablet Take 1 tablet (25 mg total) by mouth daily.  90 tablet  3   Physical Exam: Blood pressure 132/98, pulse 82, temperature 97.8 F (36.6 C), temperature source Oral, resp. rate 33, height 5' 6.5" (1.689 m), weight 258 lb (117.028 kg), SpO2 96.00%.    General: Well developed, well nourished, NAD  HEENT: OP clear, mucus  membranes moist  SKIN: warm, dry. No rashes.  Neuro: No focal deficits  Musculoskeletal: Muscle strength 5/5 all ext  Psychiatric: Mood and affect normal  Neck: No JVD, no carotid bruits, no thyromegaly, no lymphadenopathy.  Lungs:Clear bilaterally, no wheezes, rhonci, crackles  Cardiovascular: Regular rate and rhythm. No murmurs, gallops or rubs.  Abdomen:Soft. Bowel sounds present. Non-tender.  Extremities: No lower extremity edema. Pulses are 2 + in the bilateral DP/PT.  Labs:   Lab Results  Component Value Date   WBC 6.5 09/17/2013   HGB 12.7 09/17/2013   HCT 38.7 09/17/2013   MCV 88.6 09/17/2013   PLT 263 09/17/2013    Recent Labs Lab 09/17/13 0950  NA 142  K 4.0  CL 105  CO2 23  BUN 14  CREATININE 0.96  CALCIUM 8.9  PROT 6.8  BILITOT 0.6  ALKPHOS 70  ALT 24  AST 26  GLUCOSE 99   Lab Results  Component Value Date   TROPONINI <0.30 09/17/2013       Chest x-ray: 09/17/13:  Pulmonary edema, minimally improved from prior  EKG: sinus, 93 bpm, LBBB  ASSESSMENT AND PLAN:   1. Acute on chronic systolic CHF: Pt is volume overloaded. Will admit to telemetry unit and begin diuresis with IV Lasix. Will start Lasix 80 mg IV TID. Follow strict I/O. Foley if necessary. Daily weights.   2. Non-ischemic cardiomyopathy: Continue medical management with beta blocker, Ace-inh, aldactone.   3. HTN: BP controlled.   Darlina Guys, MD 09/17/2013, 12:01 PM

## 2013-09-17 NOTE — Progress Notes (Signed)
Utilization review completed. Kienna Moncada, RN, BSN. 

## 2013-09-17 NOTE — ED Notes (Signed)
She states she has a hx of CHF and has gained weight over past week. She has been feeling SOB. Her doctor told her to increase her lasix dosage yesterday with no relief of symptoms. She states her abdomen "hurts because it feels so full." A&Ox4, resp e/u

## 2013-09-18 ENCOUNTER — Other Ambulatory Visit (HOSPITAL_COMMUNITY): Payer: BC Managed Care – PPO

## 2013-09-18 DIAGNOSIS — I059 Rheumatic mitral valve disease, unspecified: Secondary | ICD-10-CM

## 2013-09-18 LAB — BASIC METABOLIC PANEL
BUN: 13 mg/dL (ref 6–23)
CO2: 26 meq/L (ref 19–32)
Calcium: 9.3 mg/dL (ref 8.4–10.5)
Chloride: 99 mEq/L (ref 96–112)
Creatinine, Ser: 1.01 mg/dL (ref 0.50–1.10)
GFR calc non Af Amer: 66 mL/min — ABNORMAL LOW (ref >90)
GFR, EST AFRICAN AMERICAN: 76 mL/min — AB (ref >90)
Glucose, Bld: 112 mg/dL — ABNORMAL HIGH (ref 70–99)
POTASSIUM: 4 meq/L (ref 3.7–5.3)
Sodium: 140 mEq/L (ref 137–147)

## 2013-09-18 MED ORDER — METOLAZONE 5 MG PO TABS
5.0000 mg | ORAL_TABLET | Freq: Once | ORAL | Status: AC
  Administered 2013-09-18: 5 mg via ORAL
  Filled 2013-09-18: qty 1

## 2013-09-18 MED ORDER — ENOXAPARIN SODIUM 60 MG/0.6ML ~~LOC~~ SOLN
55.0000 mg | SUBCUTANEOUS | Status: DC
  Administered 2013-09-18: 55 mg via SUBCUTANEOUS
  Filled 2013-09-18 (×2): qty 0.6

## 2013-09-18 MED ORDER — AMLODIPINE BESYLATE 5 MG PO TABS
5.0000 mg | ORAL_TABLET | Freq: Every day | ORAL | Status: DC
  Filled 2013-09-18: qty 1

## 2013-09-18 NOTE — Consult Note (Addendum)
Heart Failure Navigator Consult Note  Presentation: Bernisha Bezold is a 48 yo female with history of non-ischemic cardiomyopathy, HTN, non-Hodgkin's Lymphoma (radiation in 2000, chemotherapy in 4696), chronic systolic CHF who presented to the ED today with c/o weight gain and SOB. She is followed as an outpatient by Dr. Stanford Breed. She was admitted January 2015 with acute CHF and diuresed well with IV Lasix. She has been seen several times as an outpatient since then and attempts have been made to adjust her po Lasix to prevent hospitalization. Cardiac cath at [facility] 12/08/11 with normal coronary arteries. Her cardiomyopathy is presumed to be due to HTN vs chemotherapy. Echo (05/26/2013): Mild LVH, EF 45-50%, Gr 1 DD, mild LAE, RVSP 27.  Over the last few weeks she has noticed a steady increase in weight with abdominal distention. Increase in dyspnea. No chest pain. No LE edema. BNP is 2500 today (343 12 days ago). Chest x-ray with evidence of pulmonary edema.   Past Medical History  Diagnosis Date  . Lymphoma     s/p chemotherapy 2012, radiation in 2000  . Hypertension   . Multiple thyroid nodules   . Respiratory failure     was admitted in [facility] and was on ventilator  . H/O angiography     unsure of any PCI  . CHF (congestive heart failure)   . OSA (obstructive sleep apnea)     a. mild by sleep study 08/2013 - trial of weight loss;  refer to sleep med if unsucessful  . Non-ischemic cardiomyopathy     History   Social History  . Marital Status: Single    Spouse Name: N/A    Number of Children: 4  . Years of Education: N/A   Occupational History  .     Social History Main Topics  . Smoking status: Never Smoker   . Smokeless tobacco: Never Used  . Alcohol Use: 0.6 oz/week    1 Glasses of wine per week     Comment: Occasional  . Drug Use: No  . Sexual Activity: None   Other Topics Concern  . None   Social History Narrative  . None    ECHO: new pending    BNP    Component Value Date/Time   PROBNP 2528.0* 09/17/2013 0950    Education Assessment and Provision:  Detailed education and instructions provided on heart failure disease management including the following:  Signs and symptoms of Heart Failure When to call the physician Importance of daily weights Low sodium diet  Fluid restriction Medication management Anticipated future follow-up appointments  Patient education given on each of the above topics.  Patient acknowledges understanding and acceptance of all instructions.  Ms. Sanko had previous education and was able to teach back many of the topics listed above.  She says that she is aware of her signs and symptoms of HF and called the physician regarding her weight and bloating prior to admission.  However she admits that she does not currently weigh daily only weekly.  We discussed the importance of daily weights. I will plan to see her again tomorrow as our conversation was interrupted by echo tech to do echocradiogram.  Lives in home with sign.other.  Education Materials:  "Living Better With Heart Failure" Booklet, Daily Weight Tracker Tool and Heart Failure Educational Video.   High Risk Criteria for Readmission and/or Poor Patient Outcomes:  (Recommend Follow-up with Advanced Heart Failure Clinic)--Yes   EF <30%- No--45-50% 05/26/13  2 or more  admissions in 6 months- Yes  Difficult social situation- No  Demonstrates medication noncompliance- No   Barriers of Care:  Knowledge of medical condition and ability/desire to be compliant  Discharge Planning:   Plans to discharge to home with sign. other

## 2013-09-18 NOTE — Progress Notes (Signed)
Advanced Heart Failure Rounding Note  Primary Cardiologist: Dr. Stanford Breed PCP:  Subjective:    Darlene Pierce is 48 yo female with a history of NICM, HTN, non-Hodgkin's Lymphoma (radiation in 2000, chemotherapy in 2012), OSA and chronic systolic CHF. She was treated with doxorubicin, vincistine, cyclophospamide and rituxamab.   Cardiac cath at [facility] 12/08/11 with normal coronary arteries. Her cardiomyopathy is presumed to be due to HTN vs chemotherapy. Echo (05/26/2013): Mild LVH, EF 45-50%, Gr 1 DD, mild LAE, RVSP 27.  Reports that she was doing well after treatment for non-Hodgkins lymphoma and was able to perform all ADLs. She had no complaints of SOB, orthopnea or CP until about a year ago and it has progressed in the past couple of months. Was admitted in early January with SOB and diuresed with IV lasix and discharge weight was 242 lbs. She had OP sleep study that showed mild OSA with ANI of 15 events per hour. She has been followed at [facility] and reports being compliant with medications. She states that the lasix is not working and that she has noticed increased abdominal bloating. Was scheduled for Heart Failure Clinic apt Monday, but presented to the hospital yesterday with increased SOB. Pertinent labs on admission were pro-BNP 2528, K+ 4.0 and creatinine 0.96. Started on IV lasix with -1 liter. Before admission only able to walk about 25 ft before SOB. +orthopnea and PND. No CP.   SH: Lives in [place], 4 children, occassional ETOH, no smoking or drug use. Works FT at [employer] FH: Mother: CAD s/p stent (living) and Father: DM2 (living)  Objective:   Weight Range:  Vital Signs:   Temp:  [97.6 F (36.4 C)-98.1 F (36.7 C)] 97.6 F (36.4 C) (04/30 0628) Pulse Rate:  [79-89] 88 (04/30 0628) Resp:  [19-20] 20 (04/30 0628) BP: (131-145)/(87-109) 135/109 mmHg (04/30 0628) SpO2:  [94 %-99 %] 96 % (04/30 0628) Weight:  [245 lb 12.8 oz (111.494 kg)] 245 lb 12.8 oz (111.494 kg) (04/30  0628) Last BM Date: 09/16/13  Weight change: Filed Weights   09/17/13 1014 09/18/13 0628  Weight: 258 lb (117.028 kg) 245 lb 12.8 oz (111.494 kg)    Intake/Output:   Intake/Output Summary (Last 24 hours) at 09/18/13 1228 Last data filed at 09/18/13 0245  Gross per 24 hour  Intake    220 ml  Output   1305 ml  Net  -1085 ml     Physical Exam: General:  Well appearing. No resp difficulty HEENT: normal Neck: supple. JVP difficult to assess d/t body habitus but appears relatively normal. Carotids 2+ bilat; no bruits. No lymphadenopathy or thryomegaly appreciated. Cor: PMI nondisplaced. Regular rate & rhythm. No rubs, gallops or murmurs. Lungs: clear Abdomen: soft, nontender, + distended. No hepatosplenomegaly. No bruits or masses. Good bowel sounds. Extremities: no cyanosis, clubbing, rash, edema Neuro: alert & orientedx3, cranial nerves grossly intact. moves all 4 extremities w/o difficulty. Affect pleasant  Telemetry: SR 70s  Labs: Basic Metabolic Panel:  Recent Labs Lab 09/17/13 0950 09/17/13 1605 09/18/13 0600  NA 142  --  140  K 4.0  --  4.0  CL 105  --  99  CO2 23  --  26  GLUCOSE 99  --  112*  BUN 14  --  13  CREATININE 0.96 0.98 1.01  CALCIUM 8.9  --  9.3    Liver Function Tests:  Recent Labs Lab 09/17/13 0950  AST 26  ALT 24  ALKPHOS 70  BILITOT 0.6  PROT 6.8  ALBUMIN 3.4*    Recent Labs Lab 09/17/13 0950  LIPASE 14   No results found for this basename: AMMONIA,  in the last 168 hours  CBC:  Recent Labs Lab 09/17/13 0950 09/17/13 1605  WBC 6.5 8.0  NEUTROABS 4.5  --   HGB 12.7 13.1  HCT 38.7 39.3  MCV 88.6 88.3  PLT 263 269    Cardiac Enzymes:  Recent Labs Lab 09/17/13 0950  TROPONINI <0.30    BNP: BNP (last 3 results)  Recent Labs  08/17/13 1530 09/05/13 1508 09/17/13 0950  PROBNP 2296.0* 343.0* 2528.0*     Other results:  EKG: SR 93 bpm, LBBB QRS 156  Imaging: Dg Chest Port 1 View  09/17/2013   CLINICAL  DATA:  Chest pain and shortness of breath P  EXAM: PORTABLE CHEST - 1 VIEW  COMPARISON:  08/17/2013  FINDINGS: Cardiac silhouette remains enlarged, unchanged. Left subclavian Port-A-Cath remains in place with tip overlying the lower SVC. Pulmonary vascular congestion and bilateral perihilar and bibasilar lung opacities have minimally improved since the prior study. No definite pleural effusion or pneumothorax is identified. No acute osseous abnormality is seen.  IMPRESSION: Pulmonary edema, minimally improved from prior.   Electronically Signed   By: Logan Bores   On: 09/17/2013 09:56      Medications:     Scheduled Medications: . amLODipine  5 mg Oral Daily  . enoxaparin (LOVENOX) injection  40 mg Subcutaneous Q24H  . furosemide  80 mg Intravenous 3 times per day  . gabapentin  100 mg Oral Daily  . lisinopril  40 mg Oral Daily  . metoprolol succinate  100 mg Oral Daily  . potassium chloride SA  60 mEq Oral BID  . sodium chloride  3 mL Intravenous Q12H  . spironolactone  25 mg Oral Daily     Infusions:     PRN Medications:  sodium chloride, acetaminophen, albuterol, sodium chloride   Assessment/Plan/Discussion:   1) A/C systolic HF: NICM ? Chemotherapy induced (doxorubicin) or HTN. She had LHC in 2013 with normal coronaries. Last ECHO EF 45-50% with grade 1 DD, RV systolic pressure 27 mm HG. Repeat ECHO pending.  - It is difficult to assess her volume status but appears mildly elevated and abdomen distended. Will continue IV lasix 80mg  TID not sure if I/Os are accurate. Have asked nursing staff to please keep close record and to do daily weights. Creatinine stable. - On Toprol XL 100 mg daily will not titrate currently. - on lisinopril 40 mg daily and spiro 25 mg daily. - Stop norvasc currently and if we need another agent will transition to hydralazine/nitrates. SBP 110s. - Will schedule for RHC tomorrow to assess hemodynamics. 2) OSA - Likely needs CPAP. Will refer back to  Pulmonology on OP side to be fitted. 3) HTN  - stable.  4) non-Hodgkin's lymphoma - s/p chemo and radiation   Length of Stay: [place] NP-C 09/18/2013, 12:28 PM  Advanced Heart Failure Team Pager [phone] (M-F; 7a - 4p)  Please contact [facility] for night-coverage after hours (4p -7a ) and weekends on [email or web address]  Patient seen and examined with [name], NP. We discussed all aspects of the encounter. I agree with the assessment and plan as stated above.   I suspect she has mild diastolic HF but she has had relatively sluggish response to IV lasix. Will give a dose of metolazone today. Echo pending. She has had previous doxorubicin so there  remains a possibility of worsening LV dysfunction though I think this is less likely. If poor response to lasix/metolazone can do RHC in am. On d/c would change lasix to demadex 40 bid with prn metolazone.   Shaune Pascal Bensimhon,MD 4:58 PM

## 2013-09-18 NOTE — ED Provider Notes (Signed)
Medical screening examination/treatment/procedure(s) were performed by non-physician practitioner and as supervising physician I was immediately available for consultation/collaboration.   EKG Interpretation   Date/Time:  Wednesday September 17 2013 09:44:49 EDT Ventricular Rate:  93 PR Interval:  145 QRS Duration: 156 QT Interval:  423 QTC Calculation: 526 R Axis:   -54 Text Interpretation:  Sinus tachycardia Ventricular premature complex  Probable left atrial enlargement Left bundle branch block similar to prior  EKG Confirmed by Damariz Paganelli  MD, Carolyna Yerian ([id number]) on 09/17/2013 10:38:19 AM        Malvin Johns, MD 09/18/13 [phone]

## 2013-09-18 NOTE — Progress Notes (Signed)
Subjective: Orthopnea and abd distention   Objective: Vital signs in last 24 hours: Temp:  [97.6 F (36.4 C)-98.1 F (36.7 C)] 97.6 F (36.4 C) (04/30 0628) Pulse Rate:  [79-89] 88 (04/30 0628) Resp:  [19-33] 20 (04/30 0628) BP: (131-145)/(87-109) 135/109 mmHg (04/30 0628) SpO2:  [94 %-99 %] 96 % (04/30 0628) Weight:  [245 lb 12.8 oz (111.494 kg)] 245 lb 12.8 oz (111.494 kg) (04/30 0628) Last BM Date: 09/16/13  Intake/Output from previous day: 04/29 0701 - 04/30 0700 In: 220 [P.O.:220] Out: 1305 [Urine:1305] Intake/Output this shift:    Medications Current Facility-Administered Medications  Medication Dose Route Frequency Provider Last Rate Last Dose  . 0.9 %  sodium chloride infusion  250 mL Intravenous PRN Burnell Blanks, MD      . acetaminophen (TYLENOL) tablet 650 mg  650 mg Oral Q4H PRN Burnell Blanks, MD   650 mg at 09/17/13 2320  . albuterol (PROVENTIL) (2.5 MG/3ML) 0.083% nebulizer solution 2.5 mg  2.5 mg Nebulization Q6H PRN Burnell Blanks, MD      . enoxaparin (LOVENOX) injection 40 mg  40 mg Subcutaneous Q24H Burnell Blanks, MD   40 mg at 09/17/13 1953  . furosemide (LASIX) injection 80 mg  80 mg Intravenous 3 times per day Burnell Blanks, MD   80 mg at 09/18/13 6237  . gabapentin (NEURONTIN) capsule 100 mg  100 mg Oral Daily Burnell Blanks, MD      . lisinopril (PRINIVIL,ZESTRIL) tablet 40 mg  40 mg Oral Daily Burnell Blanks, MD      . metoprolol succinate (TOPROL-XL) 24 hr tablet 100 mg  100 mg Oral Daily Burnell Blanks, MD      . potassium chloride SA (K-DUR,KLOR-CON) CR tablet 60 mEq  60 mEq Oral BID Burnell Blanks, MD   60 mEq at 09/17/13 2129  . sodium chloride 0.9 % injection 3 mL  3 mL Intravenous Q12H Burnell Blanks, MD   3 mL at 09/17/13 2134  . sodium chloride 0.9 % injection 3 mL  3 mL Intravenous PRN Burnell Blanks, MD      . spironolactone (ALDACTONE) tablet 25  mg  25 mg Oral Daily Burnell Blanks, MD        PE: General appearance: alert, cooperative and no distress Neck: no JVD Lungs: clear to auscultation bilaterally Heart: regular rate and rhythm, S1, S2 normal, no murmur, click, rub or gallop Abdomen: Distended, +BS, Nontender. Extremities: No LEE Pulses: 1+ and symmetric Skin: Warm and dry Neurologic: Grossly normal  Lab Results:   Recent Labs  09/17/13 0950 09/17/13 1605  WBC 6.5 8.0  HGB 12.7 13.1  HCT 38.7 39.3  PLT 263 269   BMET  Recent Labs  09/17/13 0950 09/17/13 1605 09/18/13 0600  NA 142  --  140  K 4.0  --  4.0  CL 105  --  99  CO2 23  --  26  GLUCOSE 99  --  112*  BUN 14  --  13  CREATININE 0.96 0.98 1.01  CALCIUM 8.9  --  9.3    Assessment/Plan   Active Problems:   Hypertension  Ok Continue to monitor.    Acute on chronic systolic and diastolic heart failure, NYHA class 4 Net fluids: -1.0L.  We have her on 80mg  IV Lasix Q8hr, aldactone 25mg , ACE-I.  SCr is stable.  Continue current dosing.  She was supposed to have a an echo in  the office today.  I will change it to here.  Obesity  We discussed her eating habits in detail.  I will refer her to an OP RD.     LOS: 1 day    Tarri Fuller PA-C 09/18/2013 10:38 AM

## 2013-09-18 NOTE — Progress Notes (Signed)
  Echocardiogram 2D Echocardiogram has been performed.  Valinda Hoar 09/18/2013, 4:33 PM

## 2013-09-18 NOTE — Progress Notes (Signed)
Patient seen and examined.  Agree with Assessment and Plan as outlined by Tarri Fuller, PA-C.  Admitted with acute on chronic combined systolic/diastolic CHF mainly with increased abdominal girth and weight.  She has had ongoing problems with CHF management with multiple admissions.  The IV Lasix works well but then after a while on PO Lasix it stops working ? Issue with absorption and may need to be changed to Demadex.  She had an echo scheduled in the office for today which will be done here.  She also had an appt with Advanced CHF Team and we will consult them today.  Will continue IV diuretics for now.  Her BP is also not under good control.  Add amlodipine 5mg  daily.

## 2013-09-19 ENCOUNTER — Encounter (HOSPITAL_COMMUNITY)
Admission: EM | Disposition: A | Discharge status: Home Health | Payer: Self-pay | Source: Home / Self Care | Attending: Cardiovascular Disease

## 2013-09-19 DIAGNOSIS — I509 Heart failure, unspecified: Secondary | ICD-10-CM

## 2013-09-19 HISTORY — PX: RIGHT HEART CATHETERIZATION: SHX5447

## 2013-09-19 LAB — POCT I-STAT 3, VENOUS BLOOD GAS (G3P V)
ACID-BASE EXCESS: 3 mmol/L — AB (ref 0.0–2.0)
ACID-BASE EXCESS: 4 mmol/L — AB (ref 0.0–2.0)
Acid-Base Excess: 5 mmol/L — ABNORMAL HIGH (ref 0.0–2.0)
Acid-Base Excess: 5 mmol/L — ABNORMAL HIGH (ref 0.0–2.0)
BICARBONATE: 29.4 meq/L — AB (ref 20.0–24.0)
BICARBONATE: 31.2 meq/L — AB (ref 20.0–24.0)
Bicarbonate: 30.9 mEq/L — ABNORMAL HIGH (ref 20.0–24.0)
Bicarbonate: 31.9 mEq/L — ABNORMAL HIGH (ref 20.0–24.0)
O2 SAT: 44 %
O2 Saturation: 51 %
O2 Saturation: 53 %
O2 Saturation: 57 %
PCO2 VEN: 51 mmHg — AB (ref 45.0–50.0)
PCO2 VEN: 52.4 mmHg — AB (ref 45.0–50.0)
PCO2 VEN: 53.3 mmHg — AB (ref 45.0–50.0)
PH VEN: 7.386 — AB (ref 7.250–7.300)
PO2 VEN: 28 mmHg — AB (ref 30.0–45.0)
TCO2: 33 mmol/L (ref 0–100)
TCO2: 31 mmol/L (ref 0–100)
TCO2: 32 mmol/L (ref 0–100)
TCO2: 34 mmol/L (ref 0–100)
pCO2, Ven: 51 mmHg — ABNORMAL HIGH (ref 45.0–50.0)
pH, Ven: 7.369 — ABNORMAL HIGH (ref 7.250–7.300)
pH, Ven: 7.384 — ABNORMAL HIGH (ref 7.250–7.300)
pH, Ven: 7.391 — ABNORMAL HIGH (ref 7.250–7.300)
pO2, Ven: 25 mmHg — CL (ref 30.0–45.0)
pO2, Ven: 29 mmHg — CL (ref 30.0–45.0)
pO2, Ven: 30 mmHg (ref 30.0–45.0)

## 2013-09-19 LAB — CBC
HEMATOCRIT: 45.8 % (ref 36.0–46.0)
Hemoglobin: 15.2 g/dL — ABNORMAL HIGH (ref 12.0–15.0)
MCH: 29.5 pg (ref 26.0–34.0)
MCHC: 33.2 g/dL (ref 30.0–36.0)
MCV: 88.8 fL (ref 78.0–100.0)
Platelets: 360 10*3/uL (ref 150–400)
RBC: 5.16 MIL/uL — AB (ref 3.87–5.11)
RDW: 13.4 % (ref 11.5–15.5)
WBC: 10.8 10*3/uL — ABNORMAL HIGH (ref 4.0–10.5)

## 2013-09-19 LAB — BASIC METABOLIC PANEL
BUN: 20 mg/dL (ref 6–23)
CALCIUM: 9.8 mg/dL (ref 8.4–10.5)
CHLORIDE: 96 meq/L (ref 96–112)
CO2: 30 meq/L (ref 19–32)
CREATININE: 1.28 mg/dL — AB (ref 0.50–1.10)
GFR calc Af Amer: 57 mL/min — ABNORMAL LOW (ref >90)
GFR calc non Af Amer: 49 mL/min — ABNORMAL LOW (ref >90)
GLUCOSE: 106 mg/dL — AB (ref 70–99)
Potassium: 3.6 mEq/L — ABNORMAL LOW (ref 3.7–5.3)
Sodium: 141 mEq/L (ref 137–147)

## 2013-09-19 LAB — PROTIME-INR
INR: 1.05 (ref 0.00–1.49)
PROTHROMBIN TIME: 13.5 s (ref 11.6–15.2)

## 2013-09-19 SURGERY — RIGHT HEART CATH
Anesthesia: LOCAL

## 2013-09-19 MED ORDER — ONDANSETRON HCL 4 MG/2ML IJ SOLN
INTRAMUSCULAR | Status: AC
  Filled 2013-09-19: qty 2

## 2013-09-19 MED ORDER — FENTANYL CITRATE 0.05 MG/ML IJ SOLN
INTRAMUSCULAR | Status: AC
  Filled 2013-09-19: qty 2

## 2013-09-19 MED ORDER — MIDAZOLAM HCL 2 MG/2ML IJ SOLN
INTRAMUSCULAR | Status: AC
  Filled 2013-09-19: qty 2

## 2013-09-19 MED ORDER — HEPARIN (PORCINE) IN NACL 2-0.9 UNIT/ML-% IJ SOLN
INTRAMUSCULAR | Status: AC
  Filled 2013-09-19: qty 1000

## 2013-09-19 MED ORDER — LIDOCAINE HCL (PF) 1 % IJ SOLN
INTRAMUSCULAR | Status: AC
  Filled 2013-09-19: qty 30

## 2013-09-19 MED ORDER — SODIUM CHLORIDE 0.9 % IV SOLN
250.0000 mL | INTRAVENOUS | Status: DC | PRN
  Administered 2013-09-20: 18:00:00 via INTRAVENOUS

## 2013-09-19 MED ORDER — SODIUM CHLORIDE 0.9 % IV SOLN: 250.0000 mL | INTRAVENOUS | Status: DC | PRN

## 2013-09-19 MED ORDER — DIGOXIN 125 MCG PO TABS
0.1250 mg | ORAL_TABLET | Freq: Every day | ORAL | Status: DC
  Administered 2013-09-19: 0.125 mg via ORAL
  Filled 2013-09-19 (×2): qty 1

## 2013-09-19 MED ORDER — METOPROLOL SUCCINATE ER 50 MG PO TB24
50.0000 mg | ORAL_TABLET | Freq: Every day | ORAL | Status: DC
  Filled 2013-09-19: qty 1

## 2013-09-19 MED ORDER — ENOXAPARIN SODIUM 40 MG/0.4ML ~~LOC~~ SOLN
40.0000 mg | SUBCUTANEOUS | Status: DC
Start: 2013-09-20 — End: 2013-09-22
  Administered 2013-09-20 – 2013-09-21 (×2): 40 mg via SUBCUTANEOUS
  Filled 2013-09-19 (×4): qty 0.4

## 2013-09-19 MED ORDER — SODIUM CHLORIDE 0.9 % IV SOLN
INTRAVENOUS | Status: DC
Start: 2013-09-20 — End: 2013-09-19
  Administered 2013-09-19: 12:00:00 via INTRAVENOUS

## 2013-09-19 MED ORDER — SODIUM CHLORIDE 0.9 % IJ SOLN: 3.0000 mL | INTRAMUSCULAR | Status: DC | PRN

## 2013-09-19 MED ORDER — SODIUM CHLORIDE 0.9 % IJ SOLN
3.0000 mL | Freq: Two times a day (BID) | INTRAMUSCULAR | Status: DC
  Administered 2013-09-19: 3 mL via INTRAVENOUS

## 2013-09-19 MED ORDER — SODIUM CHLORIDE 0.9 % IJ SOLN
3.0000 mL | Freq: Two times a day (BID) | INTRAMUSCULAR | Status: DC
  Administered 2013-09-22 – 2013-09-23 (×3): 3 mL via INTRAVENOUS

## 2013-09-19 NOTE — Progress Notes (Signed)
Pt BP remains low at 92/54; HR=60's.  Pt states she feels fine and is not having SOB/CP.  Ignacia Bayley, NP notified.  Will continue to monitor.

## 2013-09-19 NOTE — Plan of Care (Signed)
Problem: Phase I Progression Outcomes Goal: EF % per last Echo/documented,Core Reminder form on chart Outcome: Completed/Met Date Met:  09/19/13 The estimated ejection fraction was in the range of 25% to 30%.

## 2013-09-19 NOTE — Progress Notes (Signed)
I stopped back in to see Darlene Pierce.  She was very sleepy following cardiac cath.  She is scheduled for outpatient follow- up with the AHF clinic.  I gave her a map with directions to get to the clinic.  I did not attempt any further education secondary to her lethargic state.  I will see her again if she remains in the hospital on Monday.

## 2013-09-19 NOTE — H&P (View-Only) (Signed)
Advanced Heart Failure Rounding Note  Primary Cardiologist: Dr. Stanford Breed PCP:  Subjective:    Darlene Pierce is 48 yo female with a history of NICM, HTN, non-Hodgkin's Lymphoma (radiation in 2000, chemotherapy in 2012), OSA and chronic systolic CHF. She was treated with doxorubicin, vincistine, cyclophospamide and rituxamab.   Cardiac cath at [facility] 12/08/11 with normal coronary arteries. Her cardiomyopathy is presumed to be due to HTN vs chemotherapy. Echo (05/26/2013): Mild LVH, EF 45-50%, Gr 1 DD, mild LAE, RVSP 27.  Reports that she was doing well after treatment for non-Hodgkins lymphoma and was able to perform all ADLs. She had no complaints of SOB, orthopnea or CP until about a year ago and it has progressed in the past couple of months. Was admitted in early January with SOB and diuresed with IV lasix and discharge weight was 242 lbs. She had OP sleep study that showed mild OSA with AHI of 15 events per hour. She has been followed at [facility] and reports being compliant with medications. She states that the lasix is not working and that she has noticed increased abdominal bloating. Was scheduled for Heart Failure Clinic apt Monday, but presented to the hospital 4/29  with increased SOB. Pertinent labs on admission were pro-BNP 2528, K+ 4.0 and creatinine 0.96. Started on IV lasix with -1 liter. Before admission only able to walk about 25 ft before SOB. +orthopnea and PND. No CP.   Remains on IV lasix 80 mg BID and received a dose of 2.5 mg metolazone yesterday. Weight down 2 lbs and 24 hr I/O -920 cc. Denies orthopnea or CP.   ECHO 4/30: EF 25-30%, grade 1 DD. RV reportedly normal.  Mild MR  Objective:   Weight Range:  Vital Signs:   Temp:  [97.7 F (36.5 C)-98 F (36.7 C)] 97.7 F (36.5 C) (05/01 0525) Pulse Rate:  [69-92] 92 (05/01 1000) Resp:  [20] 20 (05/01 0525) BP: (95-120)/(74-91) 95/82 mmHg (05/01 1000) SpO2:  [92 %-98 %] 92 % (05/01 0525) Weight:  [243 lb 12.8 oz (110.587  kg)] 243 lb 12.8 oz (110.587 kg) (05/01 0525) Last BM Date: 09/17/13  Weight change: Filed Weights   09/17/13 1014 09/18/13 0628 09/19/13 0525  Weight: 258 lb (117.028 kg) 245 lb 12.8 oz (111.494 kg) 243 lb 12.8 oz (110.587 kg)    Intake/Output:   Intake/Output Summary (Last 24 hours) at 09/19/13 1024 Last data filed at 09/19/13 0900  Gross per 24 hour  Intake   1140 ml  Output   1800 ml  Net   -660 ml     Physical Exam: General:  Well appearing. No resp difficulty HEENT: normal Neck: supple. JVP difficult to assess d/t body habitus but appears relatively normal. Carotids 2+ bilat; no bruits. No lymphadenopathy or thryomegaly appreciated. Cor: PMI nondisplaced. Regular rate & rhythm. No rubs, gallops or murmurs. Lungs: clear Abdomen: soft, nontender, + distended. No hepatosplenomegaly. No bruits or masses. Good bowel sounds. Extremities: no cyanosis, clubbing, rash, edema Neuro: alert & orientedx3, cranial nerves grossly intact. moves all 4 extremities w/o difficulty. Affect pleasant  Telemetry: SR 70s  Labs: Basic Metabolic Panel:  Recent Labs Lab 09/17/13 0950 09/17/13 1605 09/18/13 0600 09/19/13 0500  NA 142  --  140 141  K 4.0  --  4.0 3.6*  CL 105  --  99 96  CO2 23  --  26 30  GLUCOSE 99  --  112* 106*  BUN 14  --  13 20  CREATININE 0.96  0.98 1.01 1.28*  CALCIUM 8.9  --  9.3 9.8    Liver Function Tests:  Recent Labs Lab 09/17/13 0950  AST 26  ALT 24  ALKPHOS 70  BILITOT 0.6  PROT 6.8  ALBUMIN 3.4*    Recent Labs Lab 09/17/13 0950  LIPASE 14   No results found for this basename: AMMONIA,  in the last 168 hours  CBC:  Recent Labs Lab 09/17/13 0950 09/17/13 1605  WBC 6.5 8.0  NEUTROABS 4.5  --   HGB 12.7 13.1  HCT 38.7 39.3  MCV 88.6 88.3  PLT 263 269    Cardiac Enzymes:  Recent Labs Lab 09/17/13 0950  TROPONINI <0.30    BNP: BNP (last 3 results)  Recent Labs  08/17/13 1530 09/05/13 1508 09/17/13 0950  PROBNP  2296.0* 343.0* 2528.0*     Other results:  EKG: SR 93 bpm, LBBB QRS 156  Imaging: No results found.   Medications:     Scheduled Medications: . enoxaparin (LOVENOX) injection  55 mg Subcutaneous Q24H  . furosemide  80 mg Intravenous 3 times per day  . gabapentin  100 mg Oral Daily  . lisinopril  40 mg Oral Daily  . metoprolol succinate  100 mg Oral Daily  . potassium chloride SA  60 mEq Oral BID  . sodium chloride  3 mL Intravenous Q12H  . spironolactone  25 mg Oral Daily    Infusions:    PRN Medications: sodium chloride, acetaminophen, albuterol, sodium chloride   Assessment/Plan/Discussion:   1) A/C systolic HF: NICM ? Chemotherapy induced (doxorubicin) or HTN. She had LHC in 2013 with normal coronaries. Last ECHO EF 45-50% with grade 1 DD, RV systolic pressure 27 mm HG. Repeat ECHO EF 25-20%.  - Remains on IV lasix and had a dose of metolazone yesterday with sluggish UOP. Creatinine stable. Will take for RHC today to assess hemodynamics and will adjust diuretics after. - On Toprol XL 100 mg daily will not titrate currently. - on lisinopril 40 mg daily and spiro 25 mg daily. 2) OSA - Likely needs CPAP. Will refer back to Pulmonology on OP side to be fitted. 3) HTN  - stable.  4) non-Hodgkin's lymphoma - s/p chemo and radiation   Length of Stay: [place] NP-C 09/19/2013, 10:24 AM  Advanced Heart Failure Team Pager [phone] (M-F; 7a - 4p)  Please contact [facility] for night-coverage after hours (4p -7a ) and weekends on [email or web address]  Patient seen and examined with [name], NP. We discussed all aspects of the encounter. I agree with the assessment and plan as stated above.   EF newly depressed. 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   Daniel R Bensimhon,MD 1:15 PM

## 2013-09-19 NOTE — CV Procedure (Addendum)
Cardiac Cath Procedure Note:  Indication:   Procedures performed:  1) Right heart catheterization  Description of procedure:   The risks and indication of the procedure were explained. Consent was signed and placed on the chart. An appropriate timeout was taken prior to the procedure. The right neck was prepped and draped in the routine sterile fashion and anesthetized with 1% local lidocaine.   A 7 FR venous sheath was placed in the right internal jugular vein using a modified Seldinger technique. A standard Swan-Ganz catheter was used for the procedure.   Complications: None apparent.  Findings:  RA = 7 RV = 26/3/5 PA = 28/7 (15) PCW = 9 Fick cardiac output/index = 4.4/2.0 Thermo CO/CI = 2.7/1.2 PVR = FA sat = 94% PA sat = 51%, 57%, 53% SVC sat = 44%  Assessment: 1. Filling pressures are low.  2. Cardiac output is fairly low however there is a significant difference between Fick and thermodilution methods - suspect she is low output 3. No evidence of intracardiac shunt  Plan/Discussion:  She is fairly tenuous. Will stop diuretics for now. Cut toprol to 50 daily. Start digoxin. Would restart home diuretic regimen tomorrow. Possibly can go home tomorrow if renal function is stable to ambulate without too much difficulty. If getting worse may need milrinone in house. Will need close f/u in HF Clinic with possible CPX testing.   Daniel R Bensimhon,MD 1:25 PM  Addendum: Patient developed nausea and hypotension during sheath pull. Given zofran and NS 125 bolus. Will watch closely. If needed can access port to check co-ox.  Shaune Pascal Bensimhon,MD 1:29 PM

## 2013-09-19 NOTE — Plan of Care (Signed)
Problem: Limited Adherence to Nutrition-Related Recommendations (NB-1.6) Goal: Nutrition education Formal process to instruct or train a patient/client in a skill or to impart knowledge to help patients/clients voluntarily manage or modify food choices and eating behavior to maintain or improve health. Outcome: Completed/Met Date Met:  09/19/13 Nutrition Education Note  RD consulted for nutrition education regarding new onset CHF.  RD provided "Low Sodium Nutrition Therapy" handout from the Academy of [employer]. Reviewed patient's dietary recall. Provided examples on ways to decrease sodium intake in diet. Discouraged intake of processed foods and use of salt shaker. Encouraged fresh fruits and vegetables as well as whole grain sources of carbohydrates to maximize fiber intake.   RD discussed why it is important for patient to adhere to diet recommendations, and emphasized the role of fluids, foods to avoid, and importance of weighing self daily. Teach back method used.  Expect good compliance.  Body mass index is 38.77 kg/(m^2). Pt meets criteria for obesity, class 2 based on current BMI.  Current diet order is heart healthy, patient is consuming approximately 100% of meals at this time. Labs and medications reviewed. No further nutrition interventions warranted at this time. RD contact information provided. If additional nutrition issues arise, please re-consult RD.   Molli Barrows, RD, LDN, [place] Pager [phone] After Hours Pager [phone]

## 2013-09-19 NOTE — Progress Notes (Signed)
Advanced Heart Failure Rounding Note  Primary Cardiologist: Dr. Stanford Breed PCP:  Subjective:    Darlene Pierce is 48 yo female with a history of NICM, HTN, non-Hodgkin's Lymphoma (radiation in 2000, chemotherapy in 2012), OSA and chronic systolic CHF. She was treated with doxorubicin, vincistine, cyclophospamide and rituxamab.   Cardiac cath at [facility] 12/08/11 with normal coronary arteries. Her cardiomyopathy is presumed to be due to HTN vs chemotherapy. Echo (05/26/2013): Mild LVH, EF 45-50%, Gr 1 DD, mild LAE, RVSP 27.  Reports that she was doing well after treatment for non-Hodgkins lymphoma and was able to perform all ADLs. She had no complaints of SOB, orthopnea or CP until about a year ago and it has progressed in the past couple of months. Was admitted in early January with SOB and diuresed with IV lasix and discharge weight was 242 lbs. She had OP sleep study that showed mild OSA with AHI of 15 events per hour. She has been followed at [facility] and reports being compliant with medications. She states that the lasix is not working and that she has noticed increased abdominal bloating. Was scheduled for Heart Failure Clinic apt Monday, but presented to the hospital 4/29  with increased SOB. Pertinent labs on admission were pro-BNP 2528, K+ 4.0 and creatinine 0.96. Started on IV lasix with -1 liter. Before admission only able to walk about 25 ft before SOB. +orthopnea and PND. No CP.   Remains on IV lasix 80 mg BID and received a dose of 2.5 mg metolazone yesterday. Weight down 2 lbs and 24 hr I/O -920 cc. Denies orthopnea or CP.   ECHO 4/30: EF 25-30%, grade 1 DD. RV reportedly normal.  Mild MR  Objective:   Weight Range:  Vital Signs:   Temp:  [97.7 F (36.5 C)-98 F (36.7 C)] 97.7 F (36.5 C) (05/01 0525) Pulse Rate:  [69-92] 92 (05/01 1000) Resp:  [20] 20 (05/01 0525) BP: (95-120)/(74-91) 95/82 mmHg (05/01 1000) SpO2:  [92 %-98 %] 92 % (05/01 0525) Weight:  [243 lb 12.8 oz (110.587  kg)] 243 lb 12.8 oz (110.587 kg) (05/01 0525) Last BM Date: 09/17/13  Weight change: Filed Weights   09/17/13 1014 09/18/13 0628 09/19/13 0525  Weight: 258 lb (117.028 kg) 245 lb 12.8 oz (111.494 kg) 243 lb 12.8 oz (110.587 kg)    Intake/Output:   Intake/Output Summary (Last 24 hours) at 09/19/13 1024 Last data filed at 09/19/13 0900  Gross per 24 hour  Intake   1140 ml  Output   1800 ml  Net   -660 ml     Physical Exam: General:  Well appearing. No resp difficulty HEENT: normal Neck: supple. JVP difficult to assess d/t body habitus but appears relatively normal. Carotids 2+ bilat; no bruits. No lymphadenopathy or thryomegaly appreciated. Cor: PMI nondisplaced. Regular rate & rhythm. No rubs, gallops or murmurs. Lungs: clear Abdomen: soft, nontender, + distended. No hepatosplenomegaly. No bruits or masses. Good bowel sounds. Extremities: no cyanosis, clubbing, rash, edema Neuro: alert & orientedx3, cranial nerves grossly intact. moves all 4 extremities w/o difficulty. Affect pleasant  Telemetry: SR 70s  Labs: Basic Metabolic Panel:  Recent Labs Lab 09/17/13 0950 09/17/13 1605 09/18/13 0600 09/19/13 0500  NA 142  --  140 141  K 4.0  --  4.0 3.6*  CL 105  --  99 96  CO2 23  --  26 30  GLUCOSE 99  --  112* 106*  BUN 14  --  13 20  CREATININE 0.96  0.98 1.01 1.28*  CALCIUM 8.9  --  9.3 9.8    Liver Function Tests:  Recent Labs Lab 09/17/13 0950  AST 26  ALT 24  ALKPHOS 70  BILITOT 0.6  PROT 6.8  ALBUMIN 3.4*    Recent Labs Lab 09/17/13 0950  LIPASE 14   No results found for this basename: AMMONIA,  in the last 168 hours  CBC:  Recent Labs Lab 09/17/13 0950 09/17/13 1605  WBC 6.5 8.0  NEUTROABS 4.5  --   HGB 12.7 13.1  HCT 38.7 39.3  MCV 88.6 88.3  PLT 263 269    Cardiac Enzymes:  Recent Labs Lab 09/17/13 0950  TROPONINI <0.30    BNP: BNP (last 3 results)  Recent Labs  08/17/13 1530 09/05/13 1508 09/17/13 0950  PROBNP  2296.0* 343.0* 2528.0*     Other results:  EKG: SR 93 bpm, LBBB QRS 156  Imaging: No results found.   Medications:     Scheduled Medications: . enoxaparin (LOVENOX) injection  55 mg Subcutaneous Q24H  . furosemide  80 mg Intravenous 3 times per day  . gabapentin  100 mg Oral Daily  . lisinopril  40 mg Oral Daily  . metoprolol succinate  100 mg Oral Daily  . potassium chloride SA  60 mEq Oral BID  . sodium chloride  3 mL Intravenous Q12H  . spironolactone  25 mg Oral Daily    Infusions:    PRN Medications: sodium chloride, acetaminophen, albuterol, sodium chloride   Assessment/Plan/Discussion:   1) A/C systolic HF: NICM ? Chemotherapy induced (doxorubicin) or HTN. She had LHC in 2013 with normal coronaries. Last ECHO EF 45-50% with grade 1 DD, RV systolic pressure 27 mm HG. Repeat ECHO EF 25-20%.  - Remains on IV lasix and had a dose of metolazone yesterday with sluggish UOP. Creatinine stable. Will take for RHC today to assess hemodynamics and will adjust diuretics after. - On Toprol XL 100 mg daily will not titrate currently. - on lisinopril 40 mg daily and spiro 25 mg daily. 2) OSA - Likely needs CPAP. Will refer back to Pulmonology on OP side to be fitted. 3) HTN  - stable.  4) non-Hodgkin's lymphoma - s/p chemo and radiation   Length of Stay: [place] NP-C 09/19/2013, 10:24 AM  Advanced Heart Failure Team Pager [phone] (M-F; 7a - 4p)  Please contact [facility] for night-coverage after hours (4p -7a ) and weekends on [email or web address]  Patient seen and examined with [name], NP. We discussed all aspects of the encounter. I agree with the assessment and plan as stated above.   EF newly depressed. 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   Daniel R Bensimhon,MD 1:15 PM

## 2013-09-19 NOTE — Interval H&P Note (Signed)
History and Physical Interval Note:  09/19/2013 1:16 PM  Darlene Pierce  has presented today for surgery, with the diagnosis of hf  The various methods of treatment have been discussed with the patient and family. After consideration of risks, benefits and other options for treatment, the patient has consented to  Procedure(s): RIGHT HEART CATH (N/A) as a surgical intervention .  The patient's history has been reviewed, patient examined, no change in status, stable for surgery.  I have reviewed the patient's chart and labs.  Questions were answered to the patient's satisfaction.     Shaune Pascal [name]

## 2013-09-20 ENCOUNTER — Encounter (HOSPITAL_COMMUNITY): Payer: Self-pay | Admitting: *Deleted

## 2013-09-20 DIAGNOSIS — I959 Hypotension, unspecified: Secondary | ICD-10-CM

## 2013-09-20 LAB — BASIC METABOLIC PANEL
BUN: 36 mg/dL — AB (ref 6–23)
CO2: 27 mEq/L (ref 19–32)
Calcium: 9.4 mg/dL (ref 8.4–10.5)
Chloride: 97 mEq/L (ref 96–112)
Creatinine, Ser: 3.39 mg/dL — ABNORMAL HIGH (ref 0.50–1.10)
GFR, EST AFRICAN AMERICAN: 18 mL/min — AB (ref >90)
GFR, EST NON AFRICAN AMERICAN: 15 mL/min — AB (ref >90)
Glucose, Bld: 103 mg/dL — ABNORMAL HIGH (ref 70–99)
POTASSIUM: 4.3 meq/L (ref 3.7–5.3)
SODIUM: 139 meq/L (ref 137–147)

## 2013-09-20 LAB — CARBOXYHEMOGLOBIN
Carboxyhemoglobin: 1.4 % (ref 0.5–1.5)
Methemoglobin: 0.9 % (ref 0.0–1.5)
O2 Saturation: 76.2 %
Total hemoglobin: 13.1 g/dL (ref 12.0–16.0)

## 2013-09-20 MED ORDER — SODIUM CHLORIDE 0.9 % IV BOLUS (SEPSIS): 200.0000 mL | Freq: Once | INTRAVENOUS | Status: DC

## 2013-09-20 MED ORDER — DOBUTAMINE IN D5W 4-5 MG/ML-% IV SOLN
5.0000 ug/kg/min | INTRAVENOUS | Status: DC
  Administered 2013-09-20 – 2013-09-21 (×2): 5 ug/kg/min via INTRAVENOUS
  Filled 2013-09-20 (×2): qty 250

## 2013-09-20 MED ORDER — SODIUM CHLORIDE 0.9 % IV BOLUS (SEPSIS)
250.0000 mL | Freq: Once | INTRAVENOUS | Status: AC
  Administered 2013-09-20: 125 mL via INTRAVENOUS

## 2013-09-20 MED ORDER — SODIUM CHLORIDE 0.9 % IJ SOLN
10.0000 mL | INTRAMUSCULAR | Status: DC | PRN
  Administered 2013-09-24: 30 mL

## 2013-09-20 MED ORDER — SODIUM CHLORIDE 0.9 % IV BOLUS (SEPSIS)
250.0000 mL | Freq: Once | INTRAVENOUS | Status: AC
  Administered 2013-09-20: 250 mL via INTRAVENOUS

## 2013-09-20 MED ORDER — SODIUM CHLORIDE 0.9 % IJ SOLN
10.0000 mL | Freq: Two times a day (BID) | INTRAMUSCULAR | Status: DC
  Administered 2013-09-20 – 2013-09-23 (×5): 10 mL

## 2013-09-20 NOTE — Progress Notes (Signed)
Attempted to access PAC by 2 IVT RN's.  PAC hub feels tilted.  Comfortable that PAC hub was accurately accessed, but no blood return present and unable to flush PAC at all.  Pt states it hasn't been accessed or flushed in approximately 2 years.  Staff RN notified.

## 2013-09-20 NOTE — Progress Notes (Signed)
Peripherally Inserted Central Catheter/Midline Placement  The IV Nurse has discussed with the patient and/or persons authorized to consent for the patient, the purpose of this procedure and the potential benefits and risks involved with this procedure.  The benefits include less needle sticks, lab draws from the catheter and patient may be discharged home with the catheter.  Risks include, but not limited to, infection, bleeding, blood clot (thrombus formation), and puncture of an artery; nerve damage and irregular heat beat.  Alternatives to this procedure were also discussed.  PICC/Midline Placement Documentation  PICC Triple Lumen 35/57/32 PICC Left Cephalic 39 cm 1 cm (Active)  Indication for Insertion or Continuance of Line Limited venous access - need for IV therapy >5 days (PICC only) 09/20/2013  2:33 PM  Exposed Catheter (cm) 1 cm 09/20/2013  2:33 PM  Site Assessment Clean;Dry;Intact 09/20/2013  2:33 PM  Lumen #1 Status Flushed;Saline locked;Blood return noted 09/20/2013  2:33 PM  Lumen #2 Status Flushed;Saline locked;Blood return noted 09/20/2013  2:33 PM  Lumen #3 Status Flushed;Saline locked;Blood return noted 09/20/2013  2:33 PM  Dressing Type Transparent 09/20/2013  2:33 PM  Dressing Status Clean;Dry;Intact;Antimicrobial disc in place 09/20/2013  2:33 PM  Line Care Connections checked and tightened 09/20/2013  2:33 PM  Dressing Intervention New dressing 09/20/2013  2:33 PM  Dressing Change Due 09/27/13 09/20/2013  2:33 PM       Rolena Infante 09/20/2013, 2:34 PM

## 2013-09-20 NOTE — Progress Notes (Signed)
SUBJECTIVE:  Complains this am of feeling weak and SOB  OBJECTIVE:   Vitals:   Filed Vitals:   09/20/13 0400 09/20/13 0600 09/20/13 0627 09/20/13 0900  BP: 80/40 72/41 70/43  84/60  Pulse: 64 68  63  Temp: 98.4 F (36.9 C) 98.4 F (36.9 C)    TempSrc: Oral Oral    Resp: 20 20    Height:      Weight:  245 lb 13 oz (111.5 kg)    SpO2: 93% 94%     I&O's:   Intake/Output Summary (Last 24 hours) at 09/20/13 1110 Last data filed at 09/20/13 0900  Gross per 24 hour  Intake    680 ml  Output      0 ml  Net    680 ml   TELEMETRY: Reviewed telemetry pt in NSR:     PHYSICAL EXAM General: Well developed, well nourished, in no acute distress Head: Eyes PERRLA, No xanthomas.   Normal cephalic and atramatic  Lungs:   Clear bilaterally to auscultation and percussion. Heart:   HRRR S1 S2 Pulses are 2+ & equal. Abdomen: Bowel sounds are positive, abdomen soft and non-tender without masses Extremities:   No clubbing, cyanosis or edema.  DP +1 Neuro: Alert and oriented X 3. Psych:  Good affect, responds appropriately   LABS: Basic Metabolic Panel:  Recent Labs  09/19/13 0500 09/20/13 0500  NA 141 139  K 3.6* 4.3  CL 96 97  CO2 30 27  GLUCOSE 106* 103*  BUN 20 36*  CREATININE 1.28* 3.39*  CALCIUM 9.8 9.4   Liver Function Tests: No results found for this basename: AST, ALT, ALKPHOS, BILITOT, PROT, ALBUMIN,  in the last 72 hours No results found for this basename: LIPASE, AMYLASE,  in the last 72 hours CBC:  Recent Labs  09/17/13 1605  WBC 8.0  HGB 13.1  HCT 39.3  MCV 88.3  PLT 269   Cardiac Enzymes: No results found for this basename: CKTOTAL, CKMB, CKMBINDEX, TROPONINI,  in the last 72 hours BNP: No components found with this basename: POCBNP,  D-Dimer: No results found for this basename: DDIMER,  in the last 72 hours Hemoglobin A1C: No results found for this basename: HGBA1C,  in the last 72 hours Fasting Lipid Panel: No results found for this basename:  CHOL, HDL, LDLCALC, TRIG, CHOLHDL, LDLDIRECT,  in the last 72 hours Thyroid Function Tests: No results found for this basename: TSH, T4TOTAL, FREET3, T3FREE, THYROIDAB,  in the last 72 hours Anemia Panel: No results found for this basename: VITAMINB12, FOLATE, FERRITIN, TIBC, IRON, RETICCTPCT,  in the last 72 hours Coag Panel:   Lab Results  Component Value Date   INR 1.05 09/19/2013   INR 1.05 05/25/2013    RADIOLOGY: Dg Chest Port 1 View  09/17/2013   CLINICAL DATA:  Chest pain and shortness of breath P  EXAM: PORTABLE CHEST - 1 VIEW  COMPARISON:  08/17/2013  FINDINGS: Cardiac silhouette remains enlarged, unchanged. Left subclavian Port-A-Cath remains in place with tip overlying the lower SVC. Pulmonary vascular congestion and bilateral perihilar and bibasilar lung opacities have minimally improved since the prior study. No definite pleural effusion or pneumothorax is identified. No acute osseous abnormality is seen.  IMPRESSION: Pulmonary edema, minimally improved from prior.   Electronically Signed   By: Logan Bores   On: 09/17/2013 09:56   Assessment/Plan/Discussion:   1) A/C systolic HF: NICM ? Chemotherapy induced (doxorubicin) or HTN. She had LHC in 2013 with normal coronaries.  Last ECHO EF 45-50% with grade 1 DD, RV systolic pressure 27 mm HG. Repeat ECHO EF 25-20%. Right heart cath yesterday with low filling pressures and low CO c/w low output failure.  Now hypotensive this am and weak. Creatinine significantly elevated today with minimal UOP.   - diuretics stopped yesterday. UOP low. Creatinine now markedly elevated from low CO and decreased renal perfusion as well as low filling pressures at cath yesterday.   - will hold beta blocker and ACEI for now due to hypotenstion - move to CCU and start on IV inotropic therapy with Dobutamine. - tried to access [facility] hub but could not get any blood return - it has not been used in 2 years.  Will likely need a central line to monitor CVP - check  Co-Ox - gentle fluid hydration - will have CCM place central line for CVP monitoring 2) OSA  - Likely needs CPAP. Will refer back to Pulmonology on OP side to be fitted.  3) HTN  - stable.  4) non-Hodgkin's lymphoma  - s/p chemo and radiation  5)  Acute renal failure secondary to low output CHF   Sueanne Margarita, MD  09/20/2013  11:10 AM

## 2013-09-20 NOTE — Progress Notes (Signed)
B/P 86/60 manual, MD notified, orders to start dobutamine gtt  and tx to [place], pt unable to void overnight, bladder scan showed 176.

## 2013-09-20 NOTE — Progress Notes (Signed)
MD on call notified that pt has bp of 70/43 New orders received will continue to monitor. Arthor Captain LPN

## 2013-09-20 NOTE — Progress Notes (Signed)
Pt tx to [id number]

## 2013-09-21 DIAGNOSIS — G4733 Obstructive sleep apnea (adult) (pediatric): Secondary | ICD-10-CM

## 2013-09-21 DIAGNOSIS — I5042 Chronic combined systolic (congestive) and diastolic (congestive) heart failure: Secondary | ICD-10-CM

## 2013-09-21 DIAGNOSIS — N289 Disorder of kidney and ureter, unspecified: Secondary | ICD-10-CM

## 2013-09-21 LAB — CBC
HCT: 41.7 % (ref 36.0–46.0)
Hemoglobin: 13.8 g/dL (ref 12.0–15.0)
MCH: 29.7 pg (ref 26.0–34.0)
MCHC: 33.1 g/dL (ref 30.0–36.0)
MCV: 89.7 fL (ref 78.0–100.0)
PLATELETS: 287 10*3/uL (ref 150–400)
RBC: 4.65 MIL/uL (ref 3.87–5.11)
RDW: 13.7 % (ref 11.5–15.5)
WBC: 9 10*3/uL (ref 4.0–10.5)

## 2013-09-21 LAB — BASIC METABOLIC PANEL
BUN: 24 mg/dL — ABNORMAL HIGH (ref 6–23)
CHLORIDE: 99 meq/L (ref 96–112)
CO2: 25 meq/L (ref 19–32)
Calcium: 9.1 mg/dL (ref 8.4–10.5)
Creatinine, Ser: 1.31 mg/dL — ABNORMAL HIGH (ref 0.50–1.10)
GFR calc Af Amer: 56 mL/min — ABNORMAL LOW (ref >90)
GFR calc non Af Amer: 48 mL/min — ABNORMAL LOW (ref >90)
Glucose, Bld: 134 mg/dL — ABNORMAL HIGH (ref 70–99)
POTASSIUM: 4.4 meq/L (ref 3.7–5.3)
Sodium: 138 mEq/L (ref 137–147)

## 2013-09-21 LAB — CARBOXYHEMOGLOBIN
CARBOXYHEMOGLOBIN: 1.8 % — AB (ref 0.5–1.5)
Methemoglobin: 0.9 % (ref 0.0–1.5)
O2 SAT: 67.2 %
Total hemoglobin: 13.9 g/dL (ref 12.0–16.0)

## 2013-09-21 MED ORDER — SODIUM CHLORIDE 0.45 % IV SOLN: INTRAVENOUS | Status: DC

## 2013-09-21 MED ORDER — SODIUM CHLORIDE 0.9 % IV SOLN: INTRAVENOUS | Status: DC

## 2013-09-21 NOTE — Progress Notes (Signed)
Subjective:  Breathing better; BP improved on dobutamine  Objective:   Vital Signs in the last 24 hours: Temp:  [98.2 F (36.8 C)-99.6 F (37.6 C)] 98.4 F (36.9 C) (05/03 0816) Pulse Rate:  [93] 93 (05/03 0816) Resp:  [15-28] 20 (05/03 0400) BP: (82-112)/(39-56) 112/50 mmHg (05/03 0816) SpO2:  [94 %-97 %] 96 % (05/03 0816) Weight:  [248 lb 14.4 oz (112.9 kg)] 248 lb 14.4 oz (112.9 kg) (05/03 0356)  Intake/Output from previous day: 05/02 0701 - 05/03 0700 In: 1525.8 [P.O.:1240; I.V.:285.8] Out: 1025 [Urine:1025]  Medications: . enoxaparin (LOVENOX) injection  40 mg Subcutaneous Q24H  . gabapentin  100 mg Oral Daily  . sodium chloride  200 mL Intravenous Once  . sodium chloride  10-40 mL Intracatheter Q12H  . sodium chloride  3 mL Intravenous Q12H  . sodium chloride  3 mL Intravenous Q12H    . DOBUTamine 5 mcg/kg/min (09/20/13 2000)    Physical Exam:   General appearance: alert, cooperative and no distress Neck: no adenopathy, no JVD, supple, symmetrical, trachea midline and thyroid not enlarged, symmetric, no tenderness/mass/nodules Lungs: clear to auscultation bilaterally Heart: regular rate and rhythm Abdomen:  Central adiposity; soft, non-tender; bowel sounds normal; no masses,  no organomegaly Extremities: no edema, redness or tenderness in the calves or thighs Pulses: 2+ and symmetric Neurologic: Grossly normal   Rate: 96  Rhythm: normal sinus rhythm  Lab Results:   Recent Labs  09/19/13 0500 09/20/13 0500  NA 141 139  K 3.6* 4.3  CL 96 97  CO2 30 27  GLUCOSE 106* 103*  BUN 20 36*  CREATININE 1.28* 3.39*   CBC    Component Value Date/Time   WBC 8.0 09/17/2013 1605   RBC 4.45 09/17/2013 1605   HGB 13.1 09/17/2013 1605   HCT 39.3 09/17/2013 1605   PLT 269 09/17/2013 1605   MCV 88.3 09/17/2013 1605   MCH 29.4 09/17/2013 1605   MCHC 33.3 09/17/2013 1605   RDW 13.4 09/17/2013 1605   LYMPHSABS 1.5 09/17/2013 0950   MONOABS 0.3 09/17/2013 0950   EOSABS 0.2 09/17/2013 0950   BASOSABS 0.0 09/17/2013 0950    No results found for this basename: TROPONINI, CK, MB,  in the last 72 hours  Hepatic Function Panel No results found for this basename: PROT, ALBUMIN, AST, ALT, ALKPHOS, BILITOT, BILIDIR, IBILI,  in the last 72 hours  Recent Labs  09/19/13 1130  INR 1.05   BNP (last 3 results)  Recent Labs  08/17/13 1530 09/05/13 1508 09/17/13 0950  PROBNP 2296.0* 343.0* 2528.0*    Lipid Panel  No results found for this basename: chol,  trig,  hdl,  cholhdl,  vldl,  ldlcalc      Imaging:  Echo Study Conclusions  - Left ventricle: The cavity size was normal. Wall thickness was increased in a pattern of mild LVH. Systolic function was severely reduced. The estimated ejection fraction was in the range of 25% to 30%. There is hypokinesis of the entireanteroseptal myocardium. Doppler parameters are consistent with abnormal left ventricular relaxation (grade 1 diastolic dysfunction). - Mitral valve: Mild regurgitation.    Assessment/Plan:   Active Problems:   Hypertension   Acute on chronic systolic and diastolic heart failure, NYHA class 4   Hypotension arterial   Renal insufficiency   Cardiomyopathy OSA Non Hodgkin's Lymphoma s/p Chem and radiation remotely   I/O yesterday +500; -905 since admission.  BP better on dobutamine.  Cr yesterday increased to 3.39 from 1.28; today's  lab just drawn. PCWP 9 at R heart cath on 5/1;No longer on diuretics. Will start gentle hydration at 50 cc/hr. If renal fxn worse today will have renal see.    Troy Sine, MD, [facility] 09/21/2013, 11:04 AM

## 2013-09-22 ENCOUNTER — Encounter (HOSPITAL_COMMUNITY): Payer: BC Managed Care – PPO

## 2013-09-22 DIAGNOSIS — R570 Cardiogenic shock: Secondary | ICD-10-CM

## 2013-09-22 DIAGNOSIS — N179 Acute kidney failure, unspecified: Secondary | ICD-10-CM

## 2013-09-22 LAB — CBC
HCT: 39.2 % (ref 36.0–46.0)
Hemoglobin: 12.6 g/dL (ref 12.0–15.0)
MCH: 29.2 pg (ref 26.0–34.0)
MCHC: 32.1 g/dL (ref 30.0–36.0)
MCV: 90.7 fL (ref 78.0–100.0)
Platelets: 246 10*3/uL (ref 150–400)
RBC: 4.32 MIL/uL (ref 3.87–5.11)
RDW: 13.5 % (ref 11.5–15.5)
WBC: 7.2 10*3/uL (ref 4.0–10.5)

## 2013-09-22 LAB — CARBOXYHEMOGLOBIN
CARBOXYHEMOGLOBIN: 1.9 % — AB (ref 0.5–1.5)
Methemoglobin: 0.7 % (ref 0.0–1.5)
O2 Saturation: 73.9 %
Total hemoglobin: 13.9 g/dL (ref 12.0–16.0)

## 2013-09-22 LAB — BASIC METABOLIC PANEL
BUN: 20 mg/dL (ref 6–23)
CHLORIDE: 98 meq/L (ref 96–112)
CO2: 26 mEq/L (ref 19–32)
Calcium: 9.6 mg/dL (ref 8.4–10.5)
Creatinine, Ser: 1.05 mg/dL (ref 0.50–1.10)
GFR calc Af Amer: 73 mL/min — ABNORMAL LOW (ref >90)
GFR calc non Af Amer: 63 mL/min — ABNORMAL LOW (ref >90)
Glucose, Bld: 136 mg/dL — ABNORMAL HIGH (ref 70–99)
POTASSIUM: 4.1 meq/L (ref 3.7–5.3)
Sodium: 138 mEq/L (ref 137–147)

## 2013-09-22 LAB — PRO B NATRIURETIC PEPTIDE: Pro B Natriuretic peptide (BNP): 304 pg/mL — ABNORMAL HIGH (ref 0–125)

## 2013-09-22 MED ORDER — ENOXAPARIN SODIUM 60 MG/0.6ML ~~LOC~~ SOLN
55.0000 mg | SUBCUTANEOUS | Status: DC
  Administered 2013-09-22: 11:00:00 via SUBCUTANEOUS
  Administered 2013-09-23: 55 mg via SUBCUTANEOUS
  Filled 2013-09-22 (×4): qty 0.6

## 2013-09-22 MED ORDER — FE FUMARATE-B12-VIT C-FA-IFC PO CAPS
1.0000 | ORAL_CAPSULE | Freq: Every day | ORAL | Status: DC
  Administered 2013-09-22 – 2013-09-24 (×3): 1 via ORAL
  Filled 2013-09-22 (×3): qty 1

## 2013-09-22 MED ORDER — POTASSIUM CHLORIDE CRYS ER 20 MEQ PO TBCR
60.0000 meq | EXTENDED_RELEASE_TABLET | Freq: Two times a day (BID) | ORAL | Status: DC
  Administered 2013-09-22 (×2): 60 meq via ORAL
  Filled 2013-09-22 (×2): qty 3
  Filled 2013-09-22: qty 6
  Filled 2013-09-22: qty 3

## 2013-09-22 MED ORDER — LISINOPRIL 2.5 MG PO TABS
2.5000 mg | ORAL_TABLET | Freq: Every day | ORAL | Status: DC
  Administered 2013-09-22: 2.5 mg via ORAL
  Filled 2013-09-22 (×2): qty 1

## 2013-09-22 MED ORDER — DOBUTAMINE IN D5W 4-5 MG/ML-% IV SOLN: 2.5000 ug/kg/min | INTRAVENOUS | Status: DC

## 2013-09-22 MED ORDER — FUROSEMIDE 80 MG PO TABS
80.0000 mg | ORAL_TABLET | Freq: Two times a day (BID) | ORAL | Status: DC
  Administered 2013-09-22 (×2): 80 mg via ORAL
  Filled 2013-09-22: qty 1
  Filled 2013-09-22: qty 2
  Filled 2013-09-22 (×4): qty 1

## 2013-09-22 NOTE — Progress Notes (Signed)
CARDIAC REHAB PHASE I   PRE:  Rate/Rhythm: 107 ST  BP:  Supine:   Sitting: 131/95  Standing:    SaO2: 98%RA  MODE:  Ambulation: 350 ft   POST:  Rate/Rhythm: 130 ST  BP:  Supine:   Sitting: 130/57  Standing:    SaO2: 99%RA 1345-1415 Pt walked to bathroom prior to walk. Walked 350 ft on RA with steady gait. Heart rate elevated but no visible DOE. To recliner after walk. Pt got phone call that seemed important. Asked if she had CHF booklet and she said yes. Told pt we would return tomorrow and begin education.   Graylon Good, RN BSN  09/22/2013 2:11 PM

## 2013-09-22 NOTE — Progress Notes (Signed)
  Dobutamine wean started today. Down to 2.5 mcg. CO-OX now 67% SBP > 120.  Add 2.5 mg lisinopril tonight.  Repeat BMET in am.   Amy D Clegg NP-C  2:19 PM

## 2013-09-22 NOTE — Progress Notes (Signed)
Advanced Heart Failure Rounding Note  Primary Cardiologist: Dr. Stanford Breed PCP:  Subjective:    Darlene Pierce is 48 yo female with a history of NICM, HTN, non-Hodgkin's Lymphoma (radiation in 2000, chemotherapy in 2012), OSA and chronic systolic CHF. She was treated with doxorubicin, vincistine, cyclophospamide and rituxamab.   Cardiac cath at [facility] 12/08/11 with normal coronary arteries. Her cardiomyopathy is presumed to be due to HTN vs chemotherapy. Echo (05/26/2013): Mild LVH, EF 45-50%, Gr 1 DD, mild LAE, RVSP 27.  Reports that she was doing well after treatment for non-Hodgkins lymphoma and was able to perform all ADLs. She had no complaints of SOB, orthopnea or CP until about a year ago and it has progressed in the past couple of months. Was admitted in early January with SOB and diuresed with IV lasix and discharge weight was 242 lbs. She had OP sleep study that showed mild OSA with AHI of 15 events per hour. She has been followed at [facility] and reports being compliant with medications. She states that the lasix is not working and that she has noticed increased abdominal bloating. Was scheduled for Heart Failure Clinic apt Monday, but presented to the hospital 4/29  with increased SOB. Pertinent labs on admission were pro-BNP 2528, K+ 4.0 and creatinine 0.96. Started on IV lasix with -1 liter. Before admission only able to walk about 25 ft before SOB. +orthopnea and PND. No CP.   Over the weekend she was moved to stepdown due to hypotension and started on Dobutamine 5 mcg.  Ace and bb stopped. Yesterday  diuretics stopped and she was given IV fluids. Weight up 3 pounds today. Denies SOB.   CO-OX 76>67> pending.  Creatinine 1.31>1.05  RHC 09/19/13  RA = 7  RV = 26/3/5  PA = 28/7 (15)  PCW = 9  Fick cardiac output/index = 4.4/2.0  Thermo CO/CI = 2.7/1.2  ECHO 4/30: EF 25-30%, grade 1 DD. RV reportedly normal.  Mild MR  Objective:   Weight Range:  Vital Signs:   Temp:  [98.1 F (36.7  C)-98.7 F (37.1 C)] 98.1 F (36.7 C) (05/04 0342) Pulse Rate:  [91-95] 95 (05/03 1652) Resp:  [20] 20 (05/04 0000) BP: (112-144)/(50-84) 136/65 mmHg (05/04 0400) SpO2:  [6 %-97 %] 97 % (05/04 0342) Weight:  [248 lb 10.9 oz (112.8 kg)] 248 lb 10.9 oz (112.8 kg) (05/04 0342) Last BM Date: 09/17/13  Weight change: Filed Weights   09/20/13 0600 09/21/13 0356 09/22/13 0342  Weight: 245 lb 13 oz (111.5 kg) 248 lb 14.4 oz (112.9 kg) 248 lb 10.9 oz (112.8 kg)    Intake/Output:   Intake/Output Summary (Last 24 hours) at 09/22/13 0718 Last data filed at 09/22/13 0700  Gross per 24 hour  Intake 1786.6 ml  Output   1050 ml  Net  736.6 ml     Physical Exam: CVP 6-7  General:  Well appearing. No resp difficulty Sitting in the chair.  HEENT: normal Neck: supple. JVP difficult to assess d/t body habitus but does not appear elevated.  Carotids 2+ bilat; no bruits. No lymphadenopathy or thryomegaly appreciated. Cor: PMI nondisplaced. Regular rate & rhythm. No rubs, gallops or murmurs. Lungs: clear Abdomen: soft, nontender, + distended. No hepatosplenomegaly. No bruits or masses. Good bowel sounds. Extremities: no cyanosis, clubbing, rash, edema Neuro: alert & orientedx3, cranial nerves grossly intact. moves all 4 extremities w/o difficulty. Affect pleasant  Telemetry: SR 70s  Labs: Basic Metabolic Panel:  Recent Labs Lab 09/18/13 0600 09/19/13  0500 09/20/13 0500 09/21/13 1030 09/22/13 0400  NA 140 141 139 138 138  K 4.0 3.6* 4.3 4.4 4.1  CL 99 96 97 99 98  CO2 26 30 27 25 26   GLUCOSE 112* 106* 103* 134* 136*  BUN 13 20 36* 24* 20  CREATININE 1.01 1.28* 3.39* 1.31* 1.05  CALCIUM 9.3 9.8 9.4 9.1 9.6    Liver Function Tests:  Recent Labs Lab 09/17/13 0950  AST 26  ALT 24  ALKPHOS 70  BILITOT 0.6  PROT 6.8  ALBUMIN 3.4*    Recent Labs Lab 09/17/13 0950  LIPASE 14   No results found for this basename: AMMONIA,  in the last 168 hours  CBC:  Recent Labs Lab  09/17/13 0950 09/17/13 1605 09/21/13 1030 09/22/13 0400  WBC 6.5 8.0 9.0 7.2  NEUTROABS 4.5  --   --   --   HGB 12.7 13.1 13.8 12.6  HCT 38.7 39.3 41.7 39.2  MCV 88.6 88.3 89.7 90.7  PLT 263 269 287 246    Cardiac Enzymes:  Recent Labs Lab 09/17/13 0950  TROPONINI <0.30    BNP: BNP (last 3 results)  Recent Labs  09/05/13 1508 09/17/13 0950 09/22/13 0400  PROBNP 343.0* 2528.0* 304.0*     Other results:    Imaging: No results found.   Medications:     Scheduled Medications: . enoxaparin (LOVENOX) injection  40 mg Subcutaneous Q24H  . gabapentin  100 mg Oral Daily  . sodium chloride  200 mL Intravenous Once  . sodium chloride  10-40 mL Intracatheter Q12H  . sodium chloride  3 mL Intravenous Q12H  . sodium chloride  3 mL Intravenous Q12H    Infusions: . DOBUTamine 5 mcg/kg/min (09/21/13 1830)    PRN Medications: sodium chloride, sodium chloride, acetaminophen, albuterol, sodium chloride, sodium chloride, sodium chloride   Assessment/Plan/Discussion:   1) A/C systolic HF: NICM ? Chemotherapy induced (doxorubicin) or HTN. She had LHC in 2013 with normal coronaries. Last ECHO EF 45-50% with grade 1 DD, RV systolic pressure 27 mm HG. Repeat ECHO EF 25-20%.  - BP much improved. CVP 6-7. Start to wean dobutamine to 2.5 mcg. Check CO-OX at 1200. Hold off on BB and Ace. Weight up 3 pounds will restart lasix 80 mg po twice a day.Consult cardiac rehab and dietitian.  2) OSA - Likely needs CPAP. Will refer back to Pulmonology on OP side to be fitted. 3) HTN  - stable.  4) non-Hodgkin's lymphoma - s/p chemo and radiation   Length of Stay: [place] NP-C 09/22/2013, 7:18 AM  Advanced Heart Failure Team Pager [phone] (M-F; 7a - 4p)  Please contact [facility] for night-coverage after hours (4p -7a ) and weekends on [email or web address]  Patient seen and examined with Darrick Grinder, NP. We discussed all aspects of the encounter. I agree with the assessment and  plan as stated above.   Much improved since Friday. Agree with dobutamine wean. And restating lasix. Add back lisinopril 5 later today.  No b-blocker yet.  Keep in SDU while weaning inotropes.   Will need outpatient CPX soon after d/c.   Jaclynn Laumann R Era Parr,MD 8:00 AM

## 2013-09-22 NOTE — Progress Notes (Signed)
Pt was noted to be more tachycardic esp when walking hr in the 140s,was still in the low 100s to 110s resting.pt denied any symptoms for now-notified Dr Delane Ginger w/ order to wean dobutamine as tolerated.

## 2013-09-22 NOTE — Plan of Care (Signed)
Problem: Limited Adherence to Nutrition-Related Recommendations (NB-1.6) Goal: Nutrition education Formal process to instruct or train a patient/client in a skill or to impart knowledge to help patients/clients voluntarily manage or modify food choices and eating behavior to maintain or improve health.  Outcome: Completed/Met Date Met:  09/22/13 Nutrition Education Note  RD consulted for nutrition education regarding new onset CHF.  Pt last educated by RD staff 5/1 at which time handouts and education were provided.   RD followed up with pt today re: barriers to following at Low sodium diet at home as well as wt loss.  Pt recounts difficulty in managing health for the past few years since onset of heart disease.  She would like to lose wt. This was discussed as well.  RD discussed why it is important for patient to adhere to diet recommendations, and emphasized the role of fluids, foods to avoid, and importance of weighing self daily. Teach back method used.  Expect good compliance.  Body mass index is 39.54 kg/(m^2). Pt meets criteria for obese, class 3 based on current BMI.  Current diet order is Heart Healthy , patient is consuming approximately 100% of meals at this time. Labs and medications reviewed. No further nutrition interventions warranted at this time. RD contact information provided. If additional nutrition issues arise, please re-consult RD.   Brynda Greathouse, MS RD LDN Clinical Inpatient Dietitian Pager: [phone] Weekend/After hours pager: [phone]

## 2013-09-23 ENCOUNTER — Encounter (HOSPITAL_COMMUNITY): Payer: BC Managed Care – PPO

## 2013-09-23 ENCOUNTER — Telehealth: Payer: Self-pay

## 2013-09-23 LAB — BASIC METABOLIC PANEL
BUN: 24 mg/dL — AB (ref 6–23)
CO2: 28 meq/L (ref 19–32)
Calcium: 9.7 mg/dL (ref 8.4–10.5)
Chloride: 97 mEq/L (ref 96–112)
Creatinine, Ser: 1.15 mg/dL — ABNORMAL HIGH (ref 0.50–1.10)
GFR calc Af Amer: 65 mL/min — ABNORMAL LOW (ref >90)
GFR calc non Af Amer: 56 mL/min — ABNORMAL LOW (ref >90)
GLUCOSE: 136 mg/dL — AB (ref 70–99)
POTASSIUM: 4.4 meq/L (ref 3.7–5.3)
SODIUM: 140 meq/L (ref 137–147)

## 2013-09-23 LAB — CARBOXYHEMOGLOBIN
CARBOXYHEMOGLOBIN: 1.5 % (ref 0.5–1.5)
Methemoglobin: 0.6 % (ref 0.0–1.5)
O2 SAT: 75.2 %
Total hemoglobin: 13.5 g/dL (ref 12.0–16.0)

## 2013-09-23 MED ORDER — LISINOPRIL 2.5 MG PO TABS
2.5000 mg | ORAL_TABLET | Freq: Two times a day (BID) | ORAL | Status: DC
  Administered 2013-09-23 – 2013-09-24 (×3): 2.5 mg via ORAL
  Filled 2013-09-23 (×4): qty 1

## 2013-09-23 NOTE — Care Management Note (Addendum)
  Page 2 of 2   09/24/2013     2:08:38 PM CARE MANAGEMENT NOTE 09/24/2013  Patient:  Darlene Pierce, Darlene Pierce   Account Number:  [id number]  Date Initiated:  09/19/2013  Documentation initiated by:  Marvetta Gibbons  Subjective/Objective Assessment:   Pt admitted with  CHF     Action/Plan:   PTA pt lived at home- referral for HH-HF received- attempted to see pt -pt sleepy post cath- NCM to cont. to follow for potential d/c needs- pt will be followed by the HF clinic at discharge.   Anticipated DC Date:  09/24/2013   Anticipated DC Plan:  [place]  CM consult      [facility] Choice  HOME HEALTH   Choice offered to / List presented to:  C-1 Patient        [place] arranged  HH-1 RN  [place].   Status of service:  Completed, signed off Medicare Important Message given?   (If response is "NO", the following Medicare IM given date fields will be blank) Date Medicare IM given:   Date Additional Medicare IM given:    Discharge Disposition:  [place]  Per UR Regulation:  Reviewed for med. necessity/level of care/duration of stay  If discussed at [place] of Stay Meetings, dates discussed:   09/23/2013    Comments:  Mariann Laster RN, BSN, MSHL, CCM  Nurse - Case Manager, (Unit [facility])  [phone]  09/24/2013 HF Clinic:  active Dispositon:  Home with HHS: RN [facility])   5/5 0917 debbie dowell rn,bsn spoke w pt and gave her [place] home health care agency list. no pref. ref to donna w [facility] for hhrn for chf follow up.poss dc next 1-2 days per md note.

## 2013-09-23 NOTE — Progress Notes (Signed)
CARDIAC REHAB PHASE I   PRE:  Rate/Rhythm: 78 SR    BP: sitting 124/79    SaO2:   MODE:  Ambulation: 700 ft   POST:  Rate/Rhythm: 126 ST    BP: sitting 137/81     SaO2:   Pt steady, no c/o. HR elevated. Briefly hit 140s but quickly back to 120s. Tolerated well. Discussed HF, daily wts, low sodium, and ex gl. Discussed CRPII however pt sts she is going to be moving in about a month to [place]. Discussed with pt finding a cardiologist there and investigating CRPII. Pt is agreeable and seems to have a good understanding of preventing HF decompensation.  [place], ACSM 09/23/2013 11:13 AM

## 2013-09-23 NOTE — Progress Notes (Signed)
Advanced Heart Failure Rounding Note  Primary Cardiologist: Dr. Stanford Breed PCP:  Subjective:    Darlene Pierce is 48 yo female with a history of NICM, HTN, non-Hodgkin's Lymphoma (radiation in 2000, chemotherapy in 2012), OSA and chronic systolic CHF. She was treated with doxorubicin, vincistine, cyclophospamide and rituxamab.   Cardiac cath at [facility] 12/08/11 with normal coronary arteries. Her cardiomyopathy is presumed to be due to HTN vs chemotherapy. Echo (05/26/2013): Mild LVH, EF 45-50%, Gr 1 DD, mild LAE, RVSP 27.  Reports that she was doing well after treatment for non-Hodgkins lymphoma and was able to perform all ADLs. She had no complaints of SOB, orthopnea or CP until about a year ago and it has progressed in the past couple of months. Was admitted in early January with SOB and diuresed with IV lasix and discharge weight was 242 lbs. She had OP sleep study that showed mild OSA with AHI of 15 events per hour. She has been followed at [facility] and reports being compliant with medications. She states that the lasix is not working and that she has noticed increased abdominal bloating. Was scheduled for Heart Failure Clinic apt Monday, but presented to the hospital 4/29  with increased SOB. Pertinent labs on admission were pro-BNP 2528, K+ 4.0 and creatinine 0.96. Started on IV lasix with -1 liter. Before admission only able to walk about 25 ft before SOB. +orthopnea and PND. No CP.   Over the weekend she was moved to stepdown due to hypotension and started on Dobutamine 5 mcg.  Ace and bb stopped.   Yesterday  Dobutamine was weaned off and she was started on lisinopril 2.5 mg at bed time.  Denies dyspnea or bloating.   CO-OX 76>67> 73>75% off Dobutamine  Creatinine 1.31>1.05  RHC 09/19/13  RA = 7  RV = 26/3/5  PA = 28/7 (15)  PCW = 9  Fick cardiac output/index = 4.4/2.0  Thermo CO/CI = 2.7/1.2  ECHO 4/30: EF 25-30%, grade 1 DD. RV reportedly normal.  Mild MR  Objective:   Weight  Range:  Vital Signs:   Temp:  [98 F (36.7 C)-98.7 F (37.1 C)] 98.2 F (36.8 C) (05/05 0403) Pulse Rate:  [92] 92 (05/04 1653) BP: (101-151)/(50-117) 111/51 mmHg (05/05 0403) SpO2:  [97 %-99 %] 99 % (05/05 0403) Weight:  [246 lb 3.2 oz (111.676 kg)] 246 lb 3.2 oz (111.676 kg) (05/05 0212) Last BM Date: 09/17/13  Weight change: Filed Weights   09/21/13 0356 09/22/13 0342 09/23/13 0212  Weight: 248 lb 14.4 oz (112.9 kg) 248 lb 10.9 oz (112.8 kg) 246 lb 3.2 oz (111.676 kg)    Intake/Output:   Intake/Output Summary (Last 24 hours) at 09/23/13 0735 Last data filed at 09/23/13 0700  Gross per 24 hour  Intake  688.8 ml  Output   2750 ml  Net -2061.2 ml     Physical Exam: CVP 3  General:  Well appearing. No resp difficulty Sitting in the chair.  HEENT: normal Neck: supple. JVP difficult to assess d/t body habitus but does not appear elevated.  Carotids 2+ bilat; no bruits. No lymphadenopathy or thryomegaly appreciated. Cor: PMI nondisplaced. Regular rate & rhythm. No rubs, gallops or murmurs. Lungs: clear Abdomen: soft, nontender, nondistended. No hepatosplenomegaly. No bruits or masses. Good bowel sounds. Extremities: no cyanosis, clubbing, rash, edema Neuro: alert & orientedx3, cranial nerves grossly intact. moves all 4 extremities w/o difficulty. Affect pleasant  Telemetry: SR 70s  Labs: Basic Metabolic Panel:  Recent Labs Lab 09/19/13  0500 09/20/13 0500 09/21/13 1030 09/22/13 0400 09/23/13 0430  NA 141 139 138 138 140  K 3.6* 4.3 4.4 4.1 4.4  CL 96 97 99 98 97  CO2 30 27 25 26 28   GLUCOSE 106* 103* 134* 136* 136*  BUN 20 36* 24* 20 24*  CREATININE 1.28* 3.39* 1.31* 1.05 1.15*  CALCIUM 9.8 9.4 9.1 9.6 9.7    Liver Function Tests:  Recent Labs Lab 09/17/13 0950  AST 26  ALT 24  ALKPHOS 70  BILITOT 0.6  PROT 6.8  ALBUMIN 3.4*    Recent Labs Lab 09/17/13 0950  LIPASE 14   No results found for this basename: AMMONIA,  in the last 168  hours  CBC:  Recent Labs Lab 09/17/13 0950 09/17/13 1605 09/19/13 1440 09/21/13 1030 09/22/13 0400  WBC 6.5 8.0 10.8* 9.0 7.2  NEUTROABS 4.5  --   --   --   --   HGB 12.7 13.1 15.2* 13.8 12.6  HCT 38.7 39.3 45.8 41.7 39.2  MCV 88.6 88.3 88.8 89.7 90.7  PLT 263 269 360 287 246    Cardiac Enzymes:  Recent Labs Lab 09/17/13 0950  TROPONINI <0.30    BNP: BNP (last 3 results)  Recent Labs  09/05/13 1508 09/17/13 0950 09/22/13 0400  PROBNP 343.0* 2528.0* 304.0*     Other results:    Imaging: No results found.   Medications:     Scheduled Medications: . enoxaparin (LOVENOX) injection  55 mg Subcutaneous Q24H  . ferrous [id number] C-folic acid  1 capsule Oral Daily  . furosemide  80 mg Oral BID  . gabapentin  100 mg Oral Daily  . lisinopril  2.5 mg Oral QHS  . potassium chloride  60 mEq Oral BID  . sodium chloride  200 mL Intravenous Once  . sodium chloride  10-40 mL Intracatheter Q12H  . sodium chloride  3 mL Intravenous Q12H  . sodium chloride  3 mL Intravenous Q12H    Infusions:    PRN Medications: sodium chloride, sodium chloride, acetaminophen, albuterol, sodium chloride, sodium chloride, sodium chloride   Assessment/Plan/Discussion:   1) A/C systolic HF: NICM ? Chemotherapy induced (doxorubicin) or HTN. She had LHC in 2013 with normal coronaries. Last ECHO EF 45-50% with grade 1 DD, RV systolic pressure 27 mm HG. Repeat ECHO EF 25-20%.  - Stable off Dobutamine. CO-OX 75%. Volume status low. CVP 3. Hold lasix today. Hold off on BB.  Increase lisinopril to 2.5 mg twice a day. Renal function stable.  Continue to mobilize.  2) OSA - Likely needs CPAP. Will refer back to Pulmonology on OP side to be fitted. 3) HTN  - stable.  4) non-Hodgkin's lymphoma - s/p chemo and radiation   Transfer to 3E. Will need HH. Home in next day or 2.   Length of Stay: [place] NP-C 09/23/2013, 7:35 AM  Advanced Heart Failure Team Pager  [phone] (M-F; 7a - 4p)  Please contact [facility] for night-coverage after hours (4p -7a ) and weekends on [email or web address]  Patient seen and examined with Darrick Grinder, NP. We discussed all aspects of the encounter. I agree with the assessment and plan as stated above.   Much improved. Now off dobutamine. Co-ox stable. Will transfer to floor. Titrate HF meds as tolerated. Main question is if this is late-onset anthracycline toxicity or other form of NICM (viral) and will ER improve. Likely d/c tomorrow with close outpatient f/u in HF Clinic.   Shaune Pascal Raizel Wesolowski,MD 12:22  PM   

## 2013-09-23 NOTE — Telephone Encounter (Signed)
Left message for call back Non-identifiable   NEW PATIENT 

## 2013-09-23 NOTE — Discharge Instructions (Signed)
Advanced home care [phone] hhrn Heart Failure Heart failure means your heart has trouble pumping blood. This makes it hard for your body to work well. Heart failure is usually a long-term (chronic) condition. You must take good care of yourself and follow your doctor's treatment plan. HOME CARE  Take your heart medicine as told by your doctor.  Do not stop taking medicine unless your doctor tells you to.  Do not skip any dose of medicine.  Refill your medicines before they run out.  Take other medicines only as told by your doctor or pharmacist.  Stay active if told by your doctor. The elderly and people with severe heart failure should talk with a doctor about physical activity.  Eat heart healthy foods. Choose foods that are without trans fat and are low in saturated fat, cholesterol, and salt (sodium). This includes fresh or frozen fruits and vegetables, fish, lean meats, fat-free or low-fat dairy foods, whole grains, and high-fiber foods. Lentils and dried peas and beans (legumes) are also good choices.  Limit salt if told by your doctor.  Cook in a healthy way. Roast, grill, broil, bake, poach, steam, or stir-fry foods.  Limit fluids as told by your doctor.  Weigh yourself every morning. Do this after you pee (urinate) and before you eat breakfast. Write down your weight to give to your doctor.  Take your blood pressure and write it down if your doctor tell you to.  Ask your doctor how to check your pulse. Check your pulse as told.  Lose weight if told by your doctor.  Stop smoking or chewing tobacco. Do not use gum or patches that help you quit without your doctor's approval.  Schedule and go to doctor visits as told.  Nonpregnant women should have no more than 1 drink a day. Men should have no more than 2 drinks a day. Talk to your doctor about drinking alcohol.  Stop illegal drug use.  Stay current with shots (immunizations).  Manage your health conditions as told  by your doctor.  Learn to manage your stress.  Rest when you are tired.  If it is really hot outside:  Avoid intense activities.  Use air conditioning or fans, or get in a cooler place.  Avoid caffeine and alcohol.  Wear loose-fitting, lightweight, and light-colored clothing.  If it is really cold outside:  Avoid intense activities.  Layer your clothing.  Wear mittens or gloves, a hat, and a scarf when going outside.  Avoid alcohol.  Learn about heart failure and get support as needed.  Get help to maintain or improve your quality of life and your ability to care for yourself as needed. GET HELP IF:   You gain 03 lb/1.4 kg or more in 1 day or 05 lb/2.3 kg in a week.  You are more short of breath than usual.  You cannot do your normal activities.  You tire easily.  You cough more than normal, especially with activity.  You have any or more puffiness (swelling) in areas such as your hands, feet, ankles, or belly (abdomen).  You cannot sleep because it is hard to breathe.  You feel like your heart is beating fast (palpitations).  You get dizzy or lightheaded when you stand up. GET HELP RIGHT AWAY IF:   You have trouble breathing.  There is a change in mental status, such as becoming less alert or not being able to focus.  You have chest pain or discomfort.  You faint. MAKE SURE  YOU:   Understand these instructions.  Will watch your condition.  Will get help right away if you are not doing well or get worse. Document Released: 02/15/2008 Document Revised: 09/02/2012 Document Reviewed: 12/07/2011 [facility] Patient Information 2014 [place], [place].

## 2013-09-24 ENCOUNTER — Ambulatory Visit: Payer: BC Managed Care – PPO | Admitting: Family Medicine

## 2013-09-24 LAB — BASIC METABOLIC PANEL
BUN: 22 mg/dL (ref 6–23)
CO2: 22 mEq/L (ref 19–32)
CREATININE: 0.89 mg/dL (ref 0.50–1.10)
Calcium: 9.4 mg/dL (ref 8.4–10.5)
Chloride: 99 mEq/L (ref 96–112)
GFR, EST AFRICAN AMERICAN: 89 mL/min — AB (ref >90)
GFR, EST NON AFRICAN AMERICAN: 77 mL/min — AB (ref >90)
Glucose, Bld: 113 mg/dL — ABNORMAL HIGH (ref 70–99)
Potassium: 4.5 mEq/L (ref 3.7–5.3)
Sodium: 139 mEq/L (ref 137–147)

## 2013-09-24 LAB — CREATININE, SERUM
Creatinine, Ser: 0.87 mg/dL (ref 0.50–1.10)
GFR, EST NON AFRICAN AMERICAN: 79 mL/min — AB (ref >90)

## 2013-09-24 MED ORDER — CARVEDILOL 3.125 MG PO TABS
3.1250 mg | ORAL_TABLET | Freq: Two times a day (BID) | ORAL | Status: DC
  Administered 2013-09-24: 3.125 mg via ORAL
  Filled 2013-09-24 (×3): qty 1

## 2013-09-24 MED ORDER — DIGOXIN 125 MCG PO TABS
0.1250 mg | ORAL_TABLET | Freq: Every day | ORAL | Status: DC
  Administered 2013-09-24: 0.125 mg via ORAL
  Filled 2013-09-24: qty 1

## 2013-09-24 MED ORDER — DIGOXIN 125 MCG PO TABS: 0.1250 mg | ORAL_TABLET | Freq: Every day | ORAL | Status: DC

## 2013-09-24 MED ORDER — SPIRONOLACTONE 25 MG PO TABS
12.5000 mg | ORAL_TABLET | Freq: Every day | ORAL | Status: DC
Start: 2013-09-24 — End: 2016-02-28

## 2013-09-24 MED ORDER — FUROSEMIDE 80 MG PO TABS: 40.0000 mg | ORAL_TABLET | Freq: Every day | ORAL | Status: DC

## 2013-09-24 MED ORDER — SPIRONOLACTONE 12.5 MG HALF TABLET
12.5000 mg | ORAL_TABLET | Freq: Every day | ORAL | Status: DC
  Administered 2013-09-24: 12.5 mg via ORAL
  Filled 2013-09-24: qty 1

## 2013-09-24 MED ORDER — CARVEDILOL 3.125 MG PO TABS: 3.1250 mg | ORAL_TABLET | Freq: Two times a day (BID) | ORAL | Status: DC

## 2013-09-24 MED ORDER — LISINOPRIL 2.5 MG PO TABS: 2.5000 mg | ORAL_TABLET | Freq: Two times a day (BID) | ORAL | Status: DC

## 2013-09-24 NOTE — Discharge Summary (Signed)
Advanced Heart Failure Team  Discharge Summary   Patient ID: Darlene Pierce MRN: [id number], DOB/AGE: 1966/01/05 48 y.o. Admit date: 09/17/2013 D/C date:     09/24/2013   Primary Discharge Diagnoses:  1) A/C systolic HF: NICM ? Chemotherapy induced (doxorubicin) or HTN.  ECHO 08/2013 EF 25-20%.  2) OSA  - Likely needs CPAP. Will refer back to Pulmonology on OP side to be fitted.  3) HTN  4) non-Hodgkin's lymphoma  - s/p chemo and radiation   Hospital Course:  Darlene Pierce is 48 yo female with a history of NICM, HTN, non-Hodgkin's Lymphoma (radiation in 2000, chemotherapy in 2012), OSA and chronic systolic CHF. She was treated with doxorubicin, vincistine, cyclophospamide and rituxamab. She had LHC  at [facility] 12/08/11 with normal coronary arteries. Her cardiomyopathy is presumed to be due to HTN vs chemotherapy. Echo (05/26/2013): Mild LVH, EF 45-50%, Gr 1 DD, mild LAE, RVSP 27.   She presented to [facility] ED with increased dyspnea with exertion. Prior to admit she had been followed by cardiology but continued to struggle with dyspnea and volume overload despite HF medication adjustments. An ECHO was performed with significant reduction in EF from 45-50% down to 25-30%. Volume overload was noted and she was diuresed with IV lasix > Once optimized she had RHC. RHC revealed low filling pressures and low cardiac output. At that time diuretics were stopped. She later had hypotension and required short term Dobutamine.  Her creatinine bumped to 3 but recovered quickly back to normal . As she improved Dobutamine was weaned off and mixed venous saturations remained > 70%.  On the day of discharge she was able to walk 740 feet.   Prior to discharge she was placed on low dose lisinopril 2.5 mg bid, carvedilol 3.125 mg twice a day, 12.5 mg spironolactone daily, and lasix 40 mg daily. She extensive HF education regarding low salt food choices, limiting fluid intake to < 2 liters per day, and daily weights. [facility] will  continue to follow up for HF observation and assessment. She will continue to be followed closely in the HF clinic with follow up next with BMET. It should be noted she has plans to move to [place], [place] in one month and we will try coordinate with cardiology as she is going to need close HF follow up.    RHC 09/18/13 RA = 7  RV = 26/3/5  PA = 28/7 (15)  PCW = 9  Fick cardiac output/index = 4.4/2.0  Thermo CO/CI = 2.7/1.2  PVR =  FA sat = 94%  PA sat = 51%, 57%, 53%  SVC sat = 44%   Discharge Weight Range: Discharge Weight 248 pounds  Discharge Vitals: Blood pressure 108/58, pulse 75, temperature 97.6 F (36.4 C), temperature source Oral, resp. rate 18, height 5' 6.5" (1.689 m), weight 248 lb 8 oz (112.719 kg), SpO2 95.00%.  General: Well appearing. No resp difficulty Sitting in the chair.  HEENT: normal  Neck: supple. JVP difficult to assess d/t body habitus but does not appear elevated. Carotids 2+ bilat; no bruits. No lymphadenopathy or thryomegaly appreciated.  Cor: PMI nondisplaced. Regular rate & rhythm. No rubs, gallops or murmurs.  Lungs: clear  Abdomen: soft, nontender, nondistended. No hepatosplenomegaly. No bruits or masses. Good bowel sounds.  Extremities: no cyanosis, clubbing, rash, edema  Neuro: alert & orientedx3, cranial nerves grossly intact. moves all 4 extremities w/o difficulty. Affect pleasan   Labs: Lab Results  Component Value Date   WBC 7.2 09/22/2013  HGB 12.6 09/22/2013   HCT 39.2 09/22/2013   MCV 90.7 09/22/2013   PLT 246 09/22/2013     Recent Labs Lab 09/24/13 0515  NA 139  K 4.5  CL 99  CO2 22  BUN 22  CREATININE 0.87  0.89  CALCIUM 9.4  GLUCOSE 113*   No results found for this basename: CHOL,  HDL,  LDLCALC,  TRIG   BNP (last 3 results)  Recent Labs  09/05/13 1508 09/17/13 0950 09/22/13 0400  PROBNP 343.0* 2528.0* 304.0*    Diagnostic Studies/Procedures   No results found.  Discharge Medications     Medication List    STOP  taking these medications       metoprolol succinate 100 MG 24 hr tablet  Commonly known as:  TOPROL-XL     potassium chloride SA 20 MEQ tablet  Commonly known as:  K-DUR,KLOR-CON      TAKE these medications       albuterol 108 (90 BASE) MCG/ACT inhaler  Commonly known as:  PROVENTIL HFA;VENTOLIN HFA  Inhale 2 puffs into the lungs every 6 (six) hours as needed for wheezing or shortness of breath.     carvedilol 3.125 MG tablet  Commonly known as:  COREG  Take 1 tablet (3.125 mg total) by mouth 2 (two) times daily with a meal.     digoxin 0.125 MG tablet  Commonly known as:  LANOXIN  Take 1 tablet (0.125 mg total) by mouth daily.     furosemide 80 MG tablet  Commonly known as:  LASIX  Take 0.5 tablets (40 mg total) by mouth daily.     gabapentin 100 MG capsule  Commonly known as:  NEURONTIN  Take 100 mg by mouth daily.     HYDROcodone-acetaminophen 5-325 MG per tablet  Commonly known as:  NORCO  Take 1 tablet by mouth every 6 (six) hours as needed.     IRON FORMULA 27-400-100 MG-MCG-MCG Caps  Take 1 tablet by mouth daily.     lisinopril 2.5 MG tablet  Commonly known as:  PRINIVIL,ZESTRIL  Take 1 tablet (2.5 mg total) by mouth 2 (two) times daily.     spironolactone 25 MG tablet  Commonly known as:  ALDACTONE  Take 0.5 tablets (12.5 mg total) by mouth daily.     Vitamin D 2000 UNITS tablet  Take 2,000 Units by mouth daily.        Disposition   The patient will be discharged in stable condition to home. Discharge Orders   Future Appointments Provider Department Dept Phone   09/24/2013 3:00 PM Midge Minium, MD [place] at  [place] [phone]   10/01/2013 10:00 AM [facility] [phone]   Future Orders Complete By Expires   ACE Inhibitor / ARB already ordered  As directed    Diet - low sodium heart healthy  As directed    Heart Failure patients record your daily weight using  the same scale at the same time of day  As directed    Increase activity slowly  As directed      Follow-up Information   Follow up with [place] On 10/01/2013. (@ 10:00 am; Gate Code is 0040; Heart Failure Clinic is located in the hospital. Please bring all your medications to your visit)    Specialty:  Cardiology   Contact information:   [address] [place] [address] [phone]  Duration of Discharge Encounter: Greater than 35 minutes   Signed, Conrad [place] NP-C  09/24/2013, 11:07 AM  Patient seen and examined with Darrick Grinder, NP. We discussed all aspects of the encounter. I agree with the assessment and plan as stated above.   Much improved. Agree with d/c today. It is unclear if LV dysfunction related to late doxorubicin toxicity or other process. Will follow closely in HF Clinic. A scale and further HF teaching provided on day of discharge. Reinforced need for daily weights and reviewed use of sliding scale diuretics.  Shaune Pascal Bensimhon,MD 2:39 PM

## 2013-09-24 NOTE — Progress Notes (Signed)
CARDIAC REHAB PHASE I   PRE:  Rate/Rhythm: 82 Sr  BP:  Supine:   Sitting: 124/87  Standing:    SaO2: 96 RA  MODE:  Ambulation: 740 ft   POST:  Rate/Rhythm: 9  BP:  Supine:   Sitting: 130/96  Standing:    SaO2: 97 RA 0900-0920 Pt tolerated ambulation well without c/o of pain or SOB. VS stable Pt back to recliner after walk with call light in reach. Reviewed CF education with pt. She voices understanding. Pt would like some scales to take home with her.  Rodney Langton RN 09/24/2013 9:21 AM

## 2013-09-29 NOTE — Progress Notes (Signed)
Patient ID: [name], female   DOB: 08-23-1965, 48 y.o.   MRN: [id number]  HPI: Ms. Glatt is 48 yo female with a history of NICM, HTN, non-Hodgkin's Lymphoma (radiation in 2000, chemotherapy in 2012), OSA and chronic systolic CHF. She was treated with doxorubicin, vincistine, cyclophospamide and rituxamab. She had LHC at [facility], [place] 12/08/11 with normal coronary arteries. Her cardiomyopathy is presumed to be due to HTN vs chemotherapy. Echo (05/26/2013): Mild LVH, EF 45-50%, Gr 1 DD, mild LAE, RVSP 27.   She was admitted to [facility] 4/29 through 09/24/13 with acute systolic heart failure. An ECHO was performed with significant reduction in EF from 45-50% down to 25-30%. Volume overload was noted and she was diuresed with IV lasix > Once optimized she had RHC. RHC revealed low filling pressures and low cardiac output. At that time diuretics were stopped. She later had hypotension and required short term Dobutamine. Her creatinine bumped to 3 but recovered quickly back to normal . As she improved Dobutamine was weaned off and mixed venous saturations remained > 70%. On the day of discharge she was able to walk 740 feet. Prior to discharge she was placed on low dose lisinopril 2.5 mg bid, carvedilol 3.125 mg twice a day, 12.5 mg spironolactone daily, and lasix 40 mg daily. Discharge weight was 248 pounds. Plans to move [place] in one month.    She returns for post hospital follow up. Mild dyspnea with walking but she says this is much better. Still with mild dyspnea with ADLs.  Denies orthopnea/PND. Occasionally has mild dizziness when she stands.  Weight at home 246-247 pounds. Appetite fair. Taking all medications. Plans to move to [place] in 1 month. Currently not working.    RHC 09/18/13  RA = 7  RV = 26/3/5  PA = 28/7 (15)  PCW = 9  Fick cardiac output/index = 4.4/2.0  FA sat = 94%  PA sat = 51%, 57%, 53%  SVC sat = 44%   Labs 10/01/13 Creatinine 1.0 K 4.0 Pro BNP 386 Dig level 0.3   ROS: All  systems negative except as listed in HPI, PMH and Problem List.  Past Medical History  Diagnosis Date  . Lymphoma     s/p chemotherapy 2012, radiation in 2000  . Hypertension   . Multiple thyroid nodules   . Respiratory failure     was [facility] [place] [facility] and was on ventilator  . H/O angiography     unsure of any PCI  . CHF (congestive heart failure)   . OSA (obstructive sleep apnea)     a. mild by sleep study 08/2013 - trial of weight loss;  refer to sleep med if unsucessful  . Non-ischemic cardiomyopathy     Current Outpatient Prescriptions  Medication Sig Dispense Refill  . albuterol (PROVENTIL HFA;VENTOLIN HFA) 108 (90 BASE) MCG/ACT inhaler Inhale 2 puffs into the lungs every 6 (six) hours as needed for wheezing or shortness of breath.  1 Inhaler  2  . carvedilol (COREG) 3.125 MG tablet Take 1 tablet (3.125 mg total) by mouth 2 (two) times daily with a meal.  60 tablet  6  . Cholecalciferol (VITAMIN D) 2000 UNITS tablet Take 2,000 Units by mouth daily.      . digoxin (LANOXIN) 0.125 MG tablet Take 1 tablet (0.125 mg total) by mouth daily.  30 tablet  6  . furosemide (LASIX) 80 MG tablet Take 0.5 tablets (40 mg total) by mouth daily.  30 tablet  6  . gabapentin (NEURONTIN) 100 MG capsule Take 100 mg by mouth daily.      Marland Kitchen HYDROcodone-acetaminophen (NORCO) 5-325 MG per tablet Take 1 tablet by mouth every 6 (six) hours as needed.  10 tablet  0  . Iron-Folic Acid-Vit U44 (IRON FORMULA) 27-400-100 MG-MCG-MCG CAPS Take 1 tablet by mouth daily.      Marland Kitchen lisinopril (PRINIVIL,ZESTRIL) 2.5 MG tablet Take 1 tablet (2.5 mg total) by mouth 2 (two) times daily.  60 tablet  6  . spironolactone (ALDACTONE) 25 MG tablet Take 0.5 tablets (12.5 mg total) by mouth daily.  30 tablet  3   No current facility-administered medications for this encounter.     PHYSICAL EXAM: Filed Vitals:   10/01/13 1129  BP: 131/74  Pulse: 80  Resp: 18  Weight: 246 lb 6 oz (111.755 kg)  SpO2: 98%     General:  Well appearing. No resp difficulty HEENT: normal Neck: supple. JVP  5-6. Carotids 2+ bilaterally; no bruits. No lymphadenopathy or thryomegaly appreciated. Cor: PMI normal. Regular rate & rhythm. No rubs, gallops or murmurs. Lungs: clear Abdomen: Obese, soft, nontender, nondistended. No hepatosplenomegaly. No bruits or masses. Good bowel sounds. Extremities: no cyanosis, clubbing, rash, edema Neuro: alert & orientedx3, cranial nerves grossly intact. Moves all 4 extremities w/o difficulty. Affect pleasant.      ASSESSMENT & PLAN:  1. Chronic Systolic Heart Failure NICM ? Chemo Induced Doxorubicin or HTN ECHO 08/2013 EF 20-25%. Had RHC with low filling pressures and low cardiac output. NYHAB II-IIIb Volume status stable. Continue lasix 40 mg daily and instructed to take an extra 40 mg of lasix if  weight is 251 pounds or greater.  Continue carvedilol 3.125 mg twice a day and digoxin 0.125 mg daily.  Continue lisinopril 2.5 mg in and increased pm dose to 5 mg. Check BMET next week.  Reinforced daily weights, low salt food choices, and limiting fluid intake to < 2 liters per day. She plans to relocate to [place] in 1 month and she understands she needs to HF MD in [place].  She will need ECHO in 3 months after HF meds optimized and EF still remains < 35% will need to consider ICD. Check BMET, Pro BNP and dig level today ---> K 4.0 Creatinine 1.0 Pro BNP 386 Dig level 0.3   2. HTN- Continue carvedilol, lasix, and spironolactone at current dose. Increase lisinopril as above.  3. Non Hodgkins Lymphoma - S/P Chemo/Radiation 4. OSA - Will need referral to pulmonary but she is moving to [place] in 1 month. Will need to address when she moves to [place].  5. AKI - Creatinine in hospital bumped to 3 but back down prior to discharge.  Will need to watch renal function closely.  Check BMET today  Follow up in 2 weeks and continue to titrate HF meds.   Aaric Dolph Estrella Deeds NP-C  4:46  PM

## 2013-09-29 NOTE — Telephone Encounter (Signed)
Appointment cancelled

## 2013-10-01 ENCOUNTER — Encounter (HOSPITAL_COMMUNITY): Payer: Self-pay

## 2013-10-01 ENCOUNTER — Ambulatory Visit (HOSPITAL_COMMUNITY)
Admission: RE | Admit: 2013-10-01 | Discharge: 2013-10-01 | Disposition: A | Discharge status: Home / Self Care | Payer: BC Managed Care – PPO | Source: Ambulatory Visit | Attending: Internal Medicine | Admitting: Internal Medicine

## 2013-10-01 VITALS — BP 131/74 | HR 80 | Resp 18 | Wt 246.4 lb

## 2013-10-01 DIAGNOSIS — I5042 Chronic combined systolic (congestive) and diastolic (congestive) heart failure: Secondary | ICD-10-CM

## 2013-10-01 DIAGNOSIS — I5022 Chronic systolic (congestive) heart failure: Secondary | ICD-10-CM | POA: Insufficient documentation

## 2013-10-01 DIAGNOSIS — C8589 Other specified types of non-Hodgkin lymphoma, extranodal and solid organ sites: Secondary | ICD-10-CM

## 2013-10-01 DIAGNOSIS — N289 Disorder of kidney and ureter, unspecified: Secondary | ICD-10-CM

## 2013-10-01 DIAGNOSIS — C8590 Non-Hodgkin lymphoma, unspecified, unspecified site: Secondary | ICD-10-CM

## 2013-10-01 DIAGNOSIS — G4733 Obstructive sleep apnea (adult) (pediatric): Secondary | ICD-10-CM

## 2013-10-01 DIAGNOSIS — I10 Essential (primary) hypertension: Secondary | ICD-10-CM | POA: Insufficient documentation

## 2013-10-01 LAB — BASIC METABOLIC PANEL
BUN: 15 mg/dL (ref 6–23)
CHLORIDE: 98 meq/L (ref 96–112)
CO2: 28 meq/L (ref 19–32)
Calcium: 9.8 mg/dL (ref 8.4–10.5)
Creatinine, Ser: 1.05 mg/dL (ref 0.50–1.10)
GFR calc Af Amer: 73 mL/min — ABNORMAL LOW (ref >90)
GFR calc non Af Amer: 63 mL/min — ABNORMAL LOW (ref >90)
Glucose, Bld: 100 mg/dL — ABNORMAL HIGH (ref 70–99)
POTASSIUM: 4 meq/L (ref 3.7–5.3)
SODIUM: 140 meq/L (ref 137–147)

## 2013-10-01 LAB — DIGOXIN LEVEL: DIGOXIN LVL: 0.3 ng/mL — AB (ref 0.8–2.0)

## 2013-10-01 LAB — PRO B NATRIURETIC PEPTIDE: PRO B NATRI PEPTIDE: 386.6 pg/mL — AB (ref 0–125)

## 2013-10-01 MED ORDER — LISINOPRIL 2.5 MG PO TABS: ORAL_TABLET | ORAL | Status: DC

## 2013-10-01 NOTE — Patient Instructions (Addendum)
Follow up in 2 weeks  Take an extra 40 mg of lasix if your weight is 251 pounds or greater  Take 2.5 mg (1 tab)  lisinopril in am and 5 mg ( 2 tabs) in pm   Do the following things EVERYDAY: 1) Weigh yourself in the morning before breakfast. Write it down and keep it in a log. 2) Take your medicines as prescribed 3) Eat low salt foods-Limit salt (sodium) to 2000 mg per day.  4) Stay as active as you can everyday 5) Limit all fluids for the day to less than 2 liters

## 2013-10-02 ENCOUNTER — Encounter (HOSPITAL_COMMUNITY): Payer: Self-pay

## 2013-10-06 ENCOUNTER — Telehealth: Payer: Self-pay | Admitting: Licensed Clinical Social Worker

## 2013-10-06 NOTE — Telephone Encounter (Signed)
CSW referred to assist patient with resources. Patient states she was in the clinic on Thursday and visit went well. Patient states she plans to move back to [place] in the next few weeks. Patient states that she has her medications and was provided follow up care in [place]. CSW offered assistance although patient denied any needs at this time. Patient will call if further assistance needed. Raquel Sarna, [place]

## 2013-10-10 ENCOUNTER — Ambulatory Visit (HOSPITAL_COMMUNITY)
Admission: RE | Admit: 2013-10-10 | Discharge: 2013-10-10 | Disposition: A | Discharge status: Home / Self Care | Payer: BC Managed Care – PPO | Source: Ambulatory Visit | Attending: Internal Medicine | Admitting: Internal Medicine

## 2013-10-10 VITALS — BP 134/87 | HR 78 | Ht 66.5 in | Wt 254.8 lb

## 2013-10-10 DIAGNOSIS — I509 Heart failure, unspecified: Secondary | ICD-10-CM | POA: Insufficient documentation

## 2013-10-10 DIAGNOSIS — Z923 Personal history of irradiation: Secondary | ICD-10-CM | POA: Insufficient documentation

## 2013-10-10 DIAGNOSIS — G4733 Obstructive sleep apnea (adult) (pediatric): Secondary | ICD-10-CM

## 2013-10-10 DIAGNOSIS — I5042 Chronic combined systolic (congestive) and diastolic (congestive) heart failure: Secondary | ICD-10-CM

## 2013-10-10 DIAGNOSIS — Z79899 Other long term (current) drug therapy: Secondary | ICD-10-CM | POA: Insufficient documentation

## 2013-10-10 DIAGNOSIS — I10 Essential (primary) hypertension: Secondary | ICD-10-CM

## 2013-10-10 DIAGNOSIS — Z9221 Personal history of antineoplastic chemotherapy: Secondary | ICD-10-CM | POA: Insufficient documentation

## 2013-10-10 DIAGNOSIS — C8589 Other specified types of non-Hodgkin lymphoma, extranodal and solid organ sites: Secondary | ICD-10-CM | POA: Insufficient documentation

## 2013-10-10 DIAGNOSIS — I428 Other cardiomyopathies: Secondary | ICD-10-CM | POA: Insufficient documentation

## 2013-10-10 DIAGNOSIS — I5022 Chronic systolic (congestive) heart failure: Secondary | ICD-10-CM | POA: Insufficient documentation

## 2013-10-10 MED ORDER — CARVEDILOL 6.25 MG PO TABS: 6.2500 mg | ORAL_TABLET | Freq: Two times a day (BID) | ORAL | Status: DC

## 2013-10-10 NOTE — Patient Instructions (Signed)
Increase your coreg to 6.26 mg (2 tablets) twice a day. You currently have 3.125 mg tablets so take 2 tablets in the morning and 2 tablets in the evening until you run out. When you get your new prescription it will be 6.25 mg tablets and then you will take 1 tablet in the morning and 1 tablet in the evening.  Call any issues.  Will call you with a cardiologist in [place].  Would eventually like to do a cardiopulmonary exercise stress test and then in 3 months repeat ECHO.  F/U PRN.  Do the following things EVERYDAY: 1) Weigh yourself in the morning before breakfast. Write it down and keep it in a log. 2) Take your medicines as prescribed 3) Eat low salt foods-Limit salt (sodium) to 2000 mg per day.  4) Stay as active as you can everyday 5) Limit all fluids for the day to less than 2 liters 6)

## 2013-10-10 NOTE — Progress Notes (Signed)
Patient ID: [name], female   DOB: April 24, 1966, 48 y.o.   MRN: [id number]   HPI: Ms. Mance is 48 yo female with a history of NICM, HTN, non-Hodgkin's Lymphoma (radiation in 2000, chemotherapy in 2012), OSA and chronic systolic CHF. She was treated with doxorubicin, vincistine, cyclophospamide and rituxamab. She had LHC at [facility], [place] 12/08/11 with normal coronary arteries. Her cardiomyopathy is presumed to be due to HTN vs chemotherapy. Echo (05/26/2013): Mild LVH, EF 45-50%, Gr 1 DD, mild LAE, RVSP 27.   She was admitted to [facility] 4/29 through 09/24/13 with acute systolic heart failure. An ECHO was performed with significant reduction in EF from 45-50% down to 25-30%. Volume overload was noted and she was diuresed with IV lasix > Once optimized she had RHC. RHC revealed low filling pressures and low cardiac output. At that time diuretics were stopped. She later had hypotension and required short term Dobutamine. Her creatinine bumped to 3 but recovered quickly back to normal . As she improved Dobutamine was weaned off and mixed venous saturations remained > 70%. On the day of discharge she was able to walk 740 feet. Prior to discharge she was placed on low dose lisinopril 2.5 mg bid, carvedilol 3.125 mg twice a day, 12.5 mg spironolactone daily, and lasix 40 mg daily. Discharge weight was 248 pounds. Plans to move [place] in one month.   RHC 09/18/13  RA = 7  RV = 26/3/5  PA = 28/7 (15)  PCW = 9  Fick cardiac output/index = 4.4/2.0  FA sat = 94%  PA sat = 51%, 57%, 53%  SVC sat = 44%   Follow up: No changes at last visit. Feeling ok. Dyspnea has gotten better but occasionally still gets SOB with activity. +orthopnea. Denies CP, PND or edema. Able to go around the whole grocery store without stopping. Can go up about 1 flight of stairs before having to stop. Weight at home 246-248 lbs. Has needed extra lasix 1 time. Following a low salt diet and drinking less than 2 L a day. Moving to [place] in  next few weeks.   Labs 10/01/13 Creatinine 1.0 K 4.0 Pro BNP 386 Dig level 0.3   ROS: All systems negative except as listed in HPI, PMH and Problem List.  Past Medical History  Diagnosis Date  . Lymphoma     s/p chemotherapy 2012, radiation in 2000  . Hypertension   . Multiple thyroid nodules   . Respiratory failure     was [facility] [place] [facility] and was on ventilator  . H/O angiography     unsure of any PCI  . CHF (congestive heart failure)   . OSA (obstructive sleep apnea)     a. mild by sleep study 08/2013 - trial of weight loss;  refer to sleep med if unsucessful  . Non-ischemic cardiomyopathy     Current Outpatient Prescriptions  Medication Sig Dispense Refill  . carvedilol (COREG) 3.125 MG tablet Take 1 tablet (3.125 mg total) by mouth 2 (two) times daily with a meal.  60 tablet  6  . Cholecalciferol (VITAMIN D) 2000 UNITS tablet Take 2,000 Units by mouth daily.      . digoxin (LANOXIN) 0.125 MG tablet Take 1 tablet (0.125 mg total) by mouth daily.  30 tablet  6  . furosemide (LASIX) 80 MG tablet Take 40 mg by mouth 2 (two) times daily.      Marland Kitchen gabapentin (NEURONTIN) 100 MG capsule Take 100 mg by  mouth daily.      Marland Kitchen HYDROcodone-acetaminophen (NORCO) 5-325 MG per tablet Take 1 tablet by mouth every 6 (six) hours as needed.  10 tablet  0  . Iron-Folic Acid-Vit P80 (IRON FORMULA) 27-400-100 MG-MCG-MCG CAPS Take 1 tablet by mouth daily.      Marland Kitchen lisinopril (PRINIVIL,ZESTRIL) 2.5 MG tablet Take 2.5 mg in am and 5 mg in pm  90 tablet  6  . spironolactone (ALDACTONE) 25 MG tablet Take 0.5 tablets (12.5 mg total) by mouth daily.  30 tablet  3  . albuterol (PROVENTIL HFA;VENTOLIN HFA) 108 (90 BASE) MCG/ACT inhaler Inhale 2 puffs into the lungs every 6 (six) hours as needed for wheezing or shortness of breath.  1 Inhaler  2   No current facility-administered medications for this encounter.    Filed Vitals:   10/10/13 1012  BP: 134/87  Pulse: 78  Height: 5' 6.5" (1.689 m)   Weight: 254 lb 12.8 oz (115.577 kg)   PHYSICAL EXAM: General:  Well appearing. No resp difficulty HEENT: normal Neck: supple. JVP flat. Carotids 2+ bilaterally; no bruits. No lymphadenopathy or thryomegaly appreciated. Cor: PMI normal. Regular rate & rhythm. No rubs, gallops or murmurs. Lungs: clear Abdomen: Obese, soft, nontender, nondistended. No hepatosplenomegaly. No bruits or masses. Good bowel sounds. Extremities: no cyanosis, clubbing, rash, edema Neuro: alert & orientedx3, cranial nerves grossly intact. Moves all 4 extremities w/o difficulty. Affect pleasant.   ASSESSMENT & PLAN:  1. Chronic Systolic Heart Failure: NICM ? Chemo Induced Doxorubicin or HTN,  EF 20-25% (08/2013) - NYHA II/III symptoms and volume status stable. Will continue lasix 40 mg daily. She understands the use of sliding scale diuretics. - Will increase coreg to 6.25 mg BID and instructed to call if any increase in fatigue or any dizziness. - Will not titrate any other medications since patient is moving to [place] this weekend and will not be able to have F/U blood work. Will continue lisinopril 2.5 mg q am and 5 mg q pm and Spiro 12.5 mg daily. - She will need repeat ECHO in 3 months to assess EF. If remains less than 35% would need referral for EP.  - Reinforced the need and importance of daily weights, a low sodium diet, and fluid restriction (less than 2 L a day). Instructed to call the HF clinic if weight increases more than 3 lbs overnight or 5 lbs in a week.  2. HTN- - stable as above increase BB for LV dysfunction. 3. Non Hodgkins Lymphoma - S/P Chemo/Radiation 4. ?OSA - Will need referral to pulmonary but she is moving to [place] in 1 month. Will need to address when she moves to [place].  5. AKI - Creatinine in hospital bumped to 3 but back down prior to discharge. Continue to follow last Cr stable.     F/U PRN. Will refer to cardiologist in [place].  Rande Brunt NP-C  10:21 AM

## 2013-10-30 ENCOUNTER — Telehealth (HOSPITAL_COMMUNITY): Payer: Self-pay | Admitting: *Deleted

## 2013-10-30 NOTE — Telephone Encounter (Signed)
Pt is at appt in [place] at Dr Zenia Resides office and states they need records, per [facility] records were faxed on Tue however she states they did not receive them, records refaxed to them at [phone]

## 2013-12-03 HISTORY — PX: BI-VENTRICULAR IMPLANTABLE CARDIOVERTER DEFIBRILLATOR  (CRT-D): SHX5747

## 2014-04-30 ENCOUNTER — Encounter (HOSPITAL_COMMUNITY): Payer: Self-pay | Admitting: Internal Medicine

## 2014-09-24 ENCOUNTER — Other Ambulatory Visit (HOSPITAL_COMMUNITY): Payer: Self-pay | Admitting: Adult Health

## 2014-11-29 ENCOUNTER — Other Ambulatory Visit (HOSPITAL_COMMUNITY): Payer: Self-pay | Admitting: Internal Medicine

## 2015-03-31 ENCOUNTER — Encounter: Payer: Self-pay | Admitting: Internal Medicine

## 2015-05-11 IMAGING — CR DG SHOULDER 2+V*R*
4 series · 4 of 4 positions shown · non-contrast
Comparison: DG CHEST 2 VIEW dated 04/29/2013

CLINICAL DATA: Right shoulder pain

EXAM:
RIGHT SHOULDER - 2+ VIEW

[w shoulder external right]
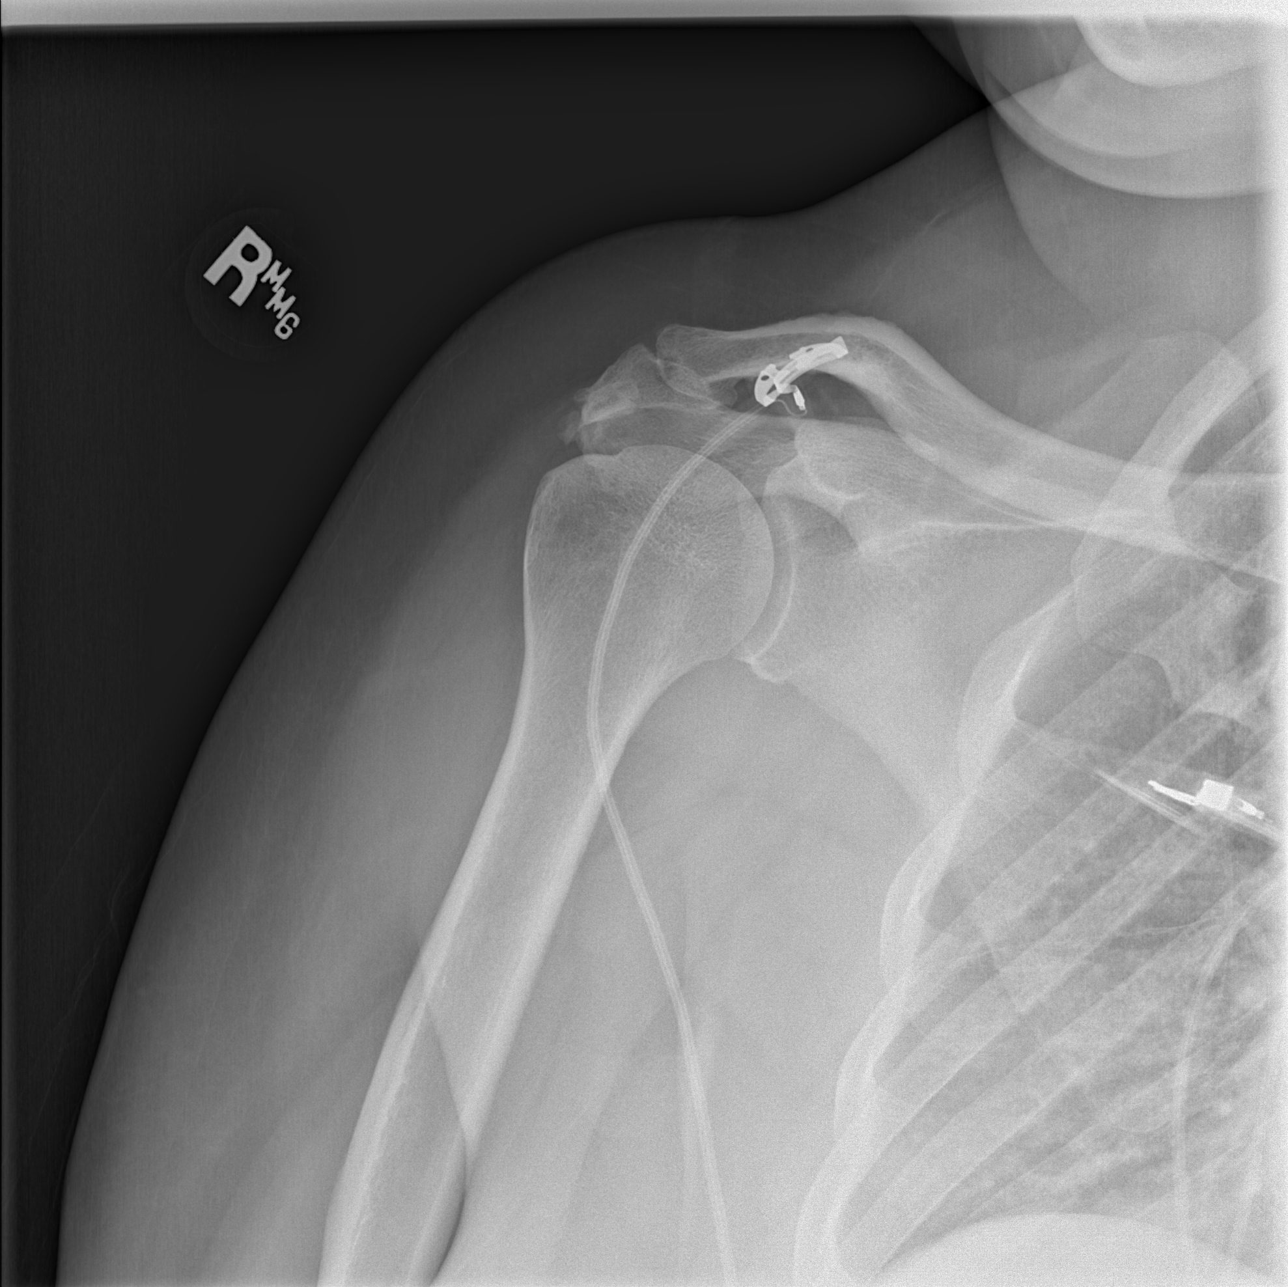

[w shoulder y-view right]
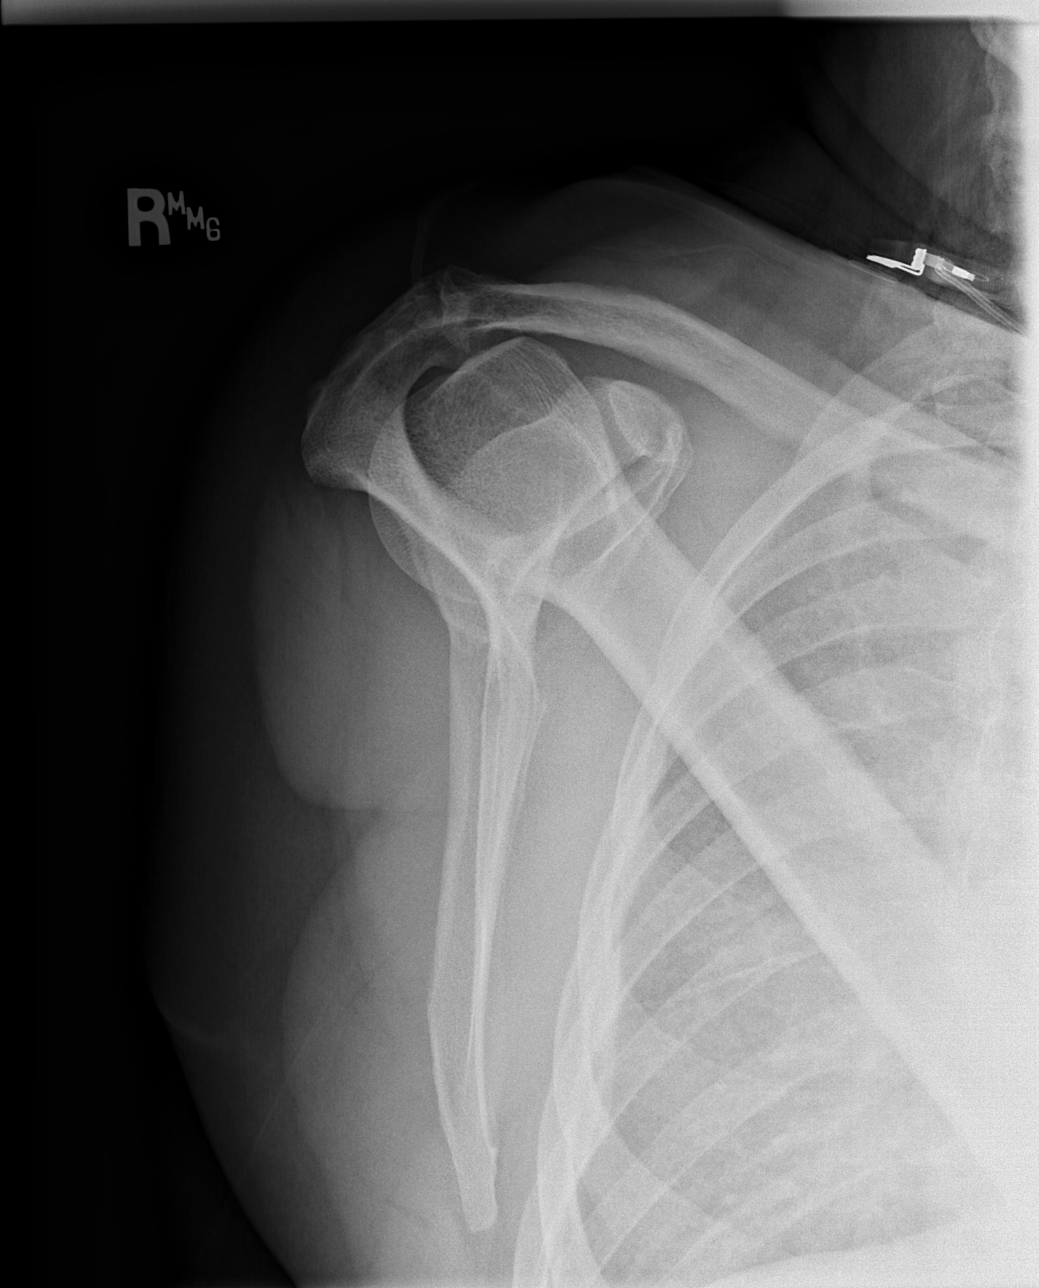

[x shoulder axillary right (1 of 2)]
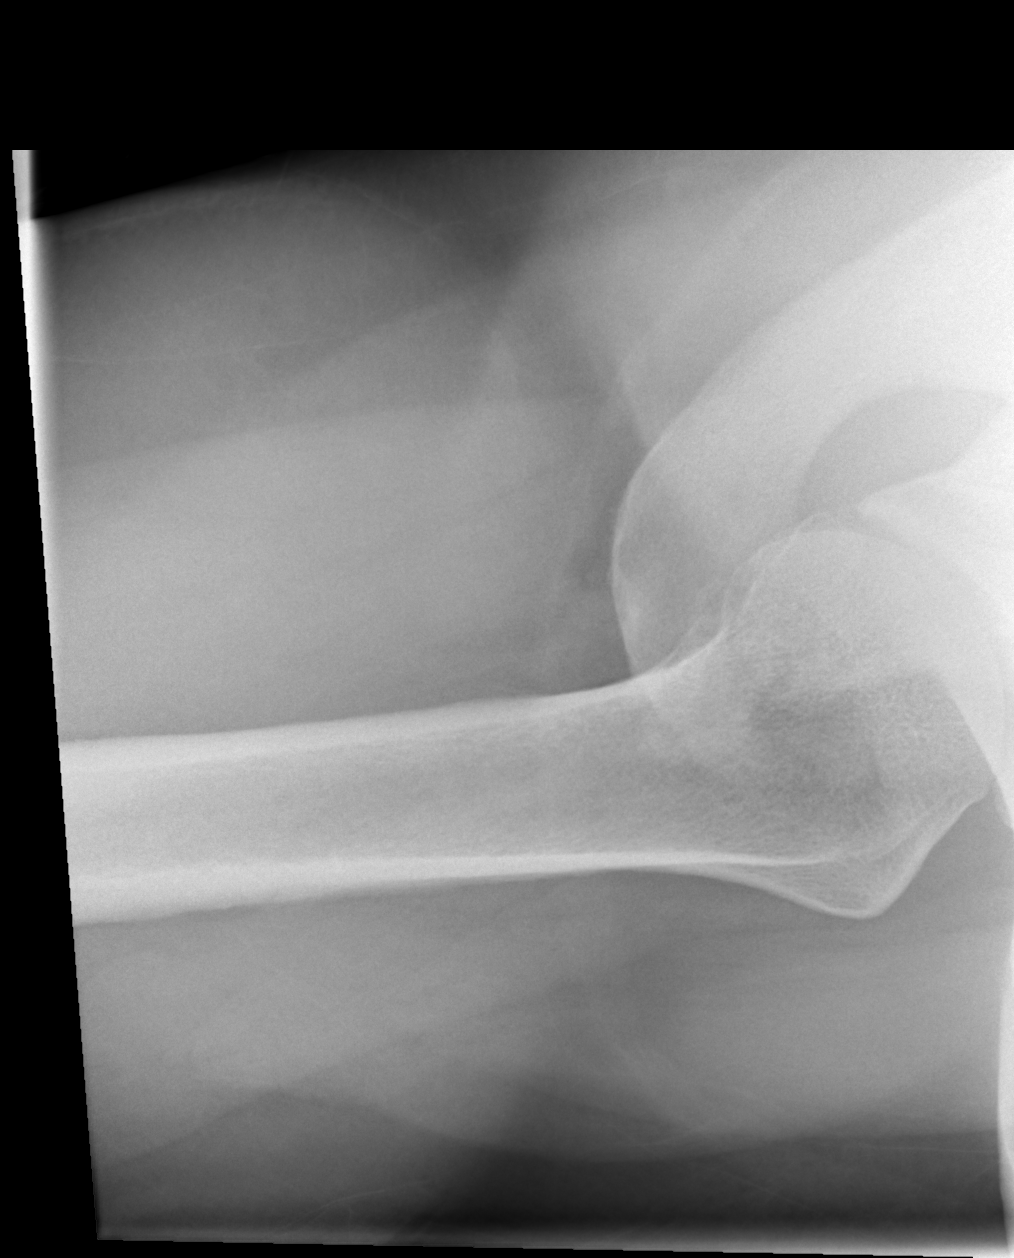

[x shoulder axillary right (2 of 2)]
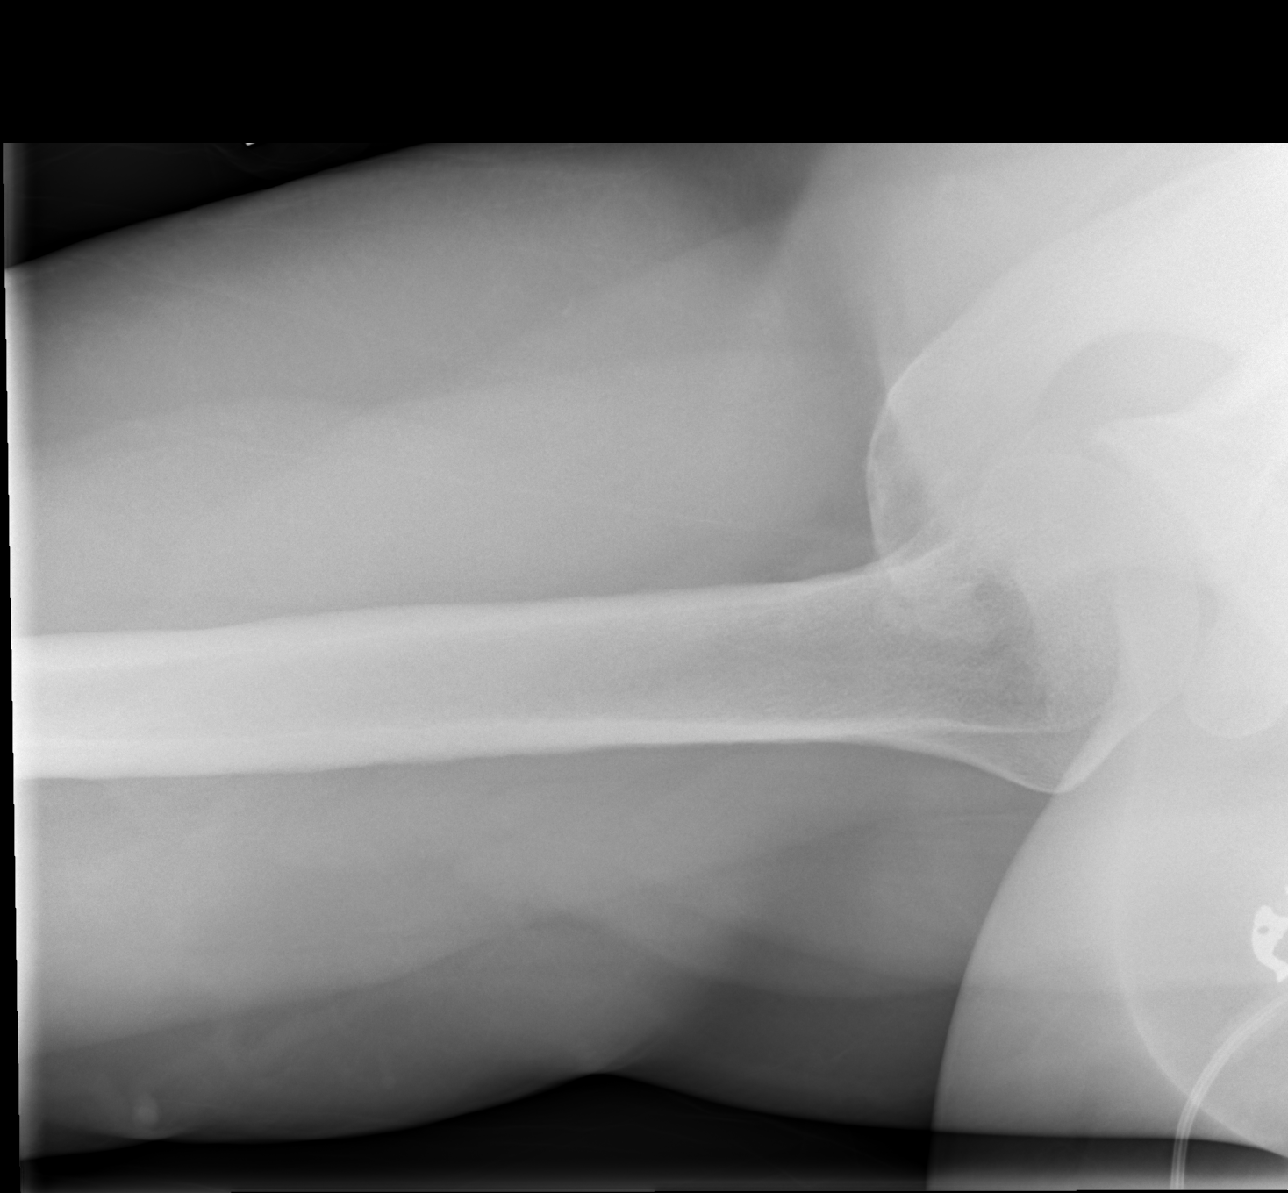

[4 of 4 positions shown; findings below may reference images not displayed]

FINDINGS: There is no fracture or dislocation. There are mild degenerative
changes of the acromioclavicular joint. There are enthesopathic
changes at the deltoid origin.
IMPRESSION: No acute osseous injury of the right shoulder.

## 2015-08-03 IMAGING — CR DG CHEST 2V
2 series · 2 of 2 positions shown · non-contrast
Comparison: 06/12/2013.

CLINICAL DATA: Shortness of breath.  Cough.

EXAM:
CHEST  2 VIEW

[w chest pa]
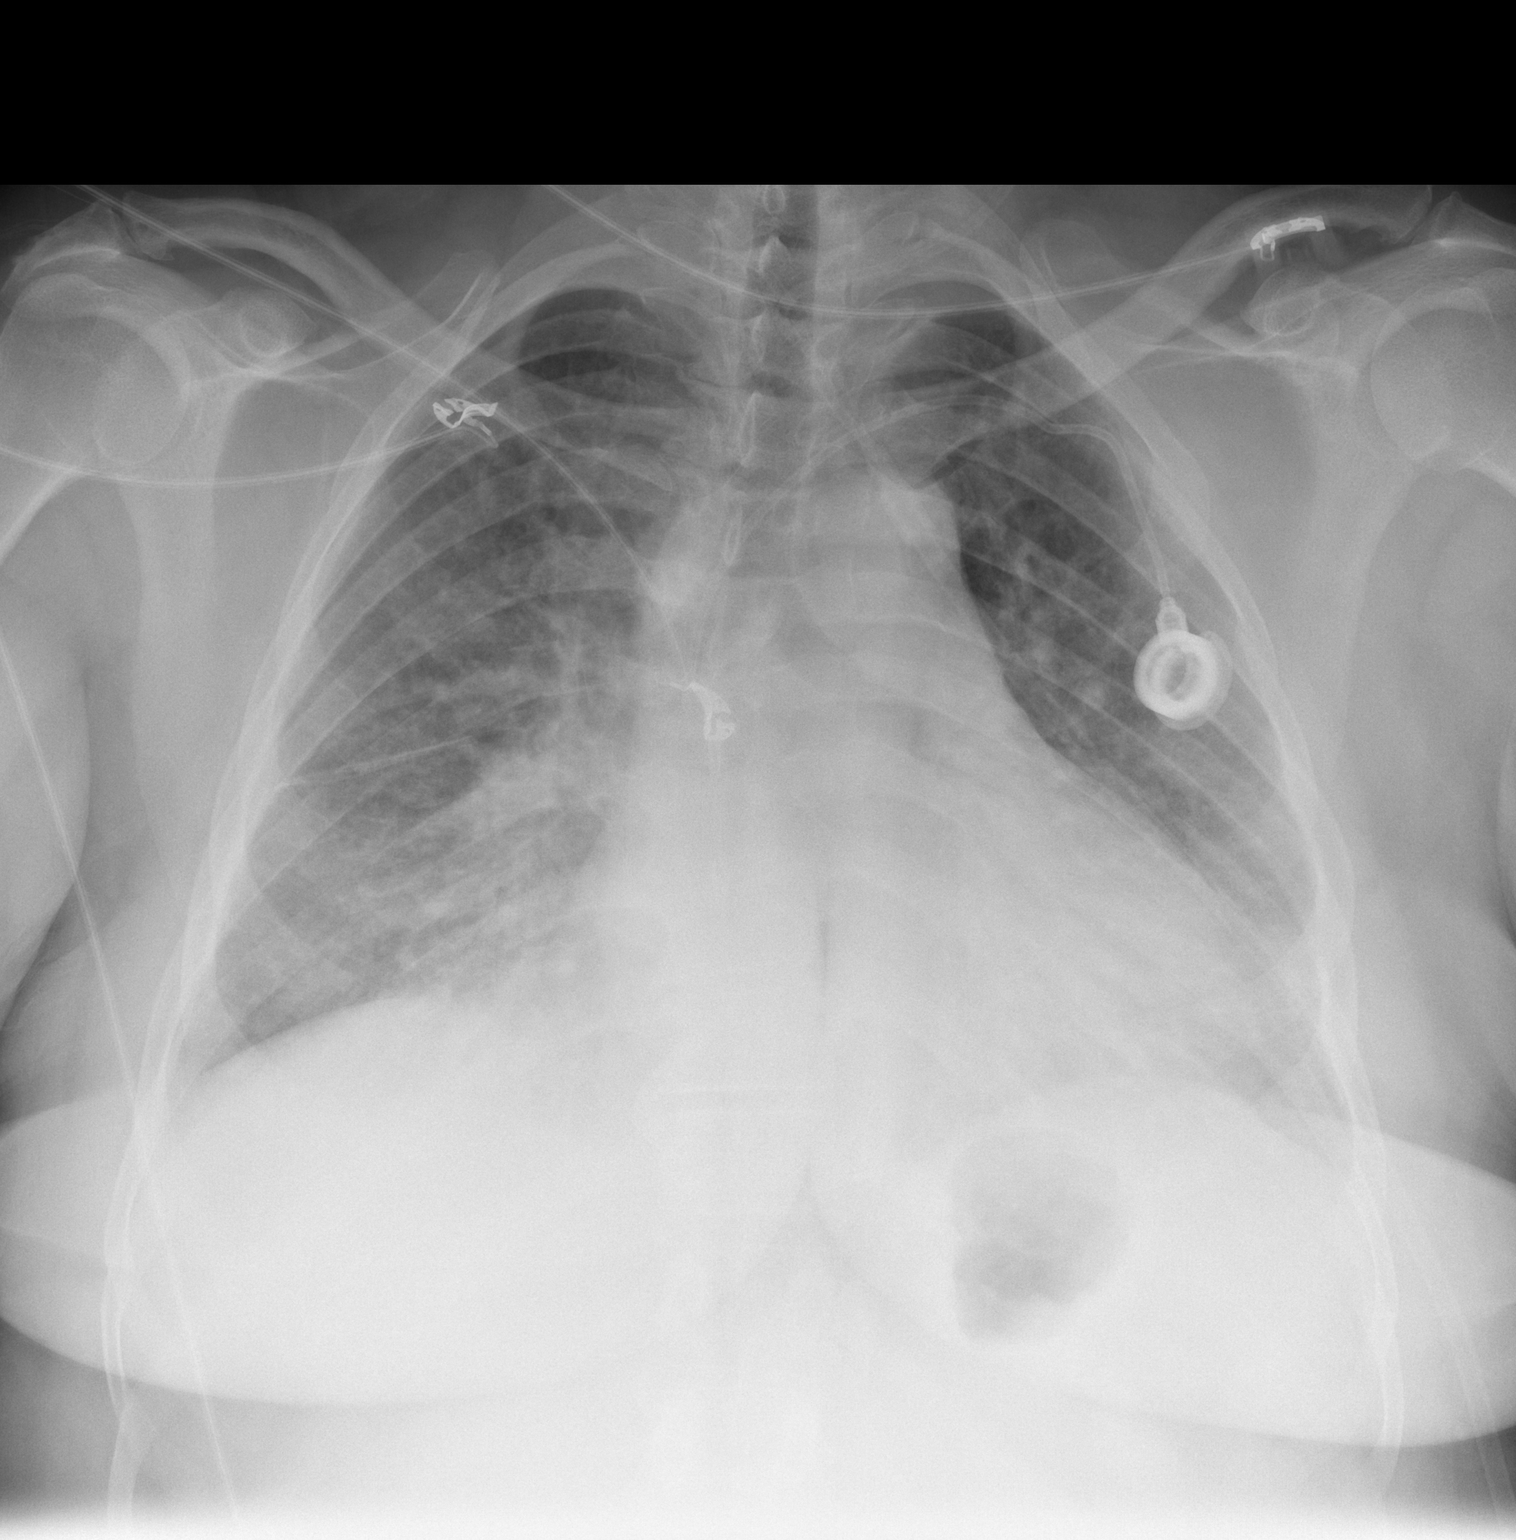

[w chest lat]
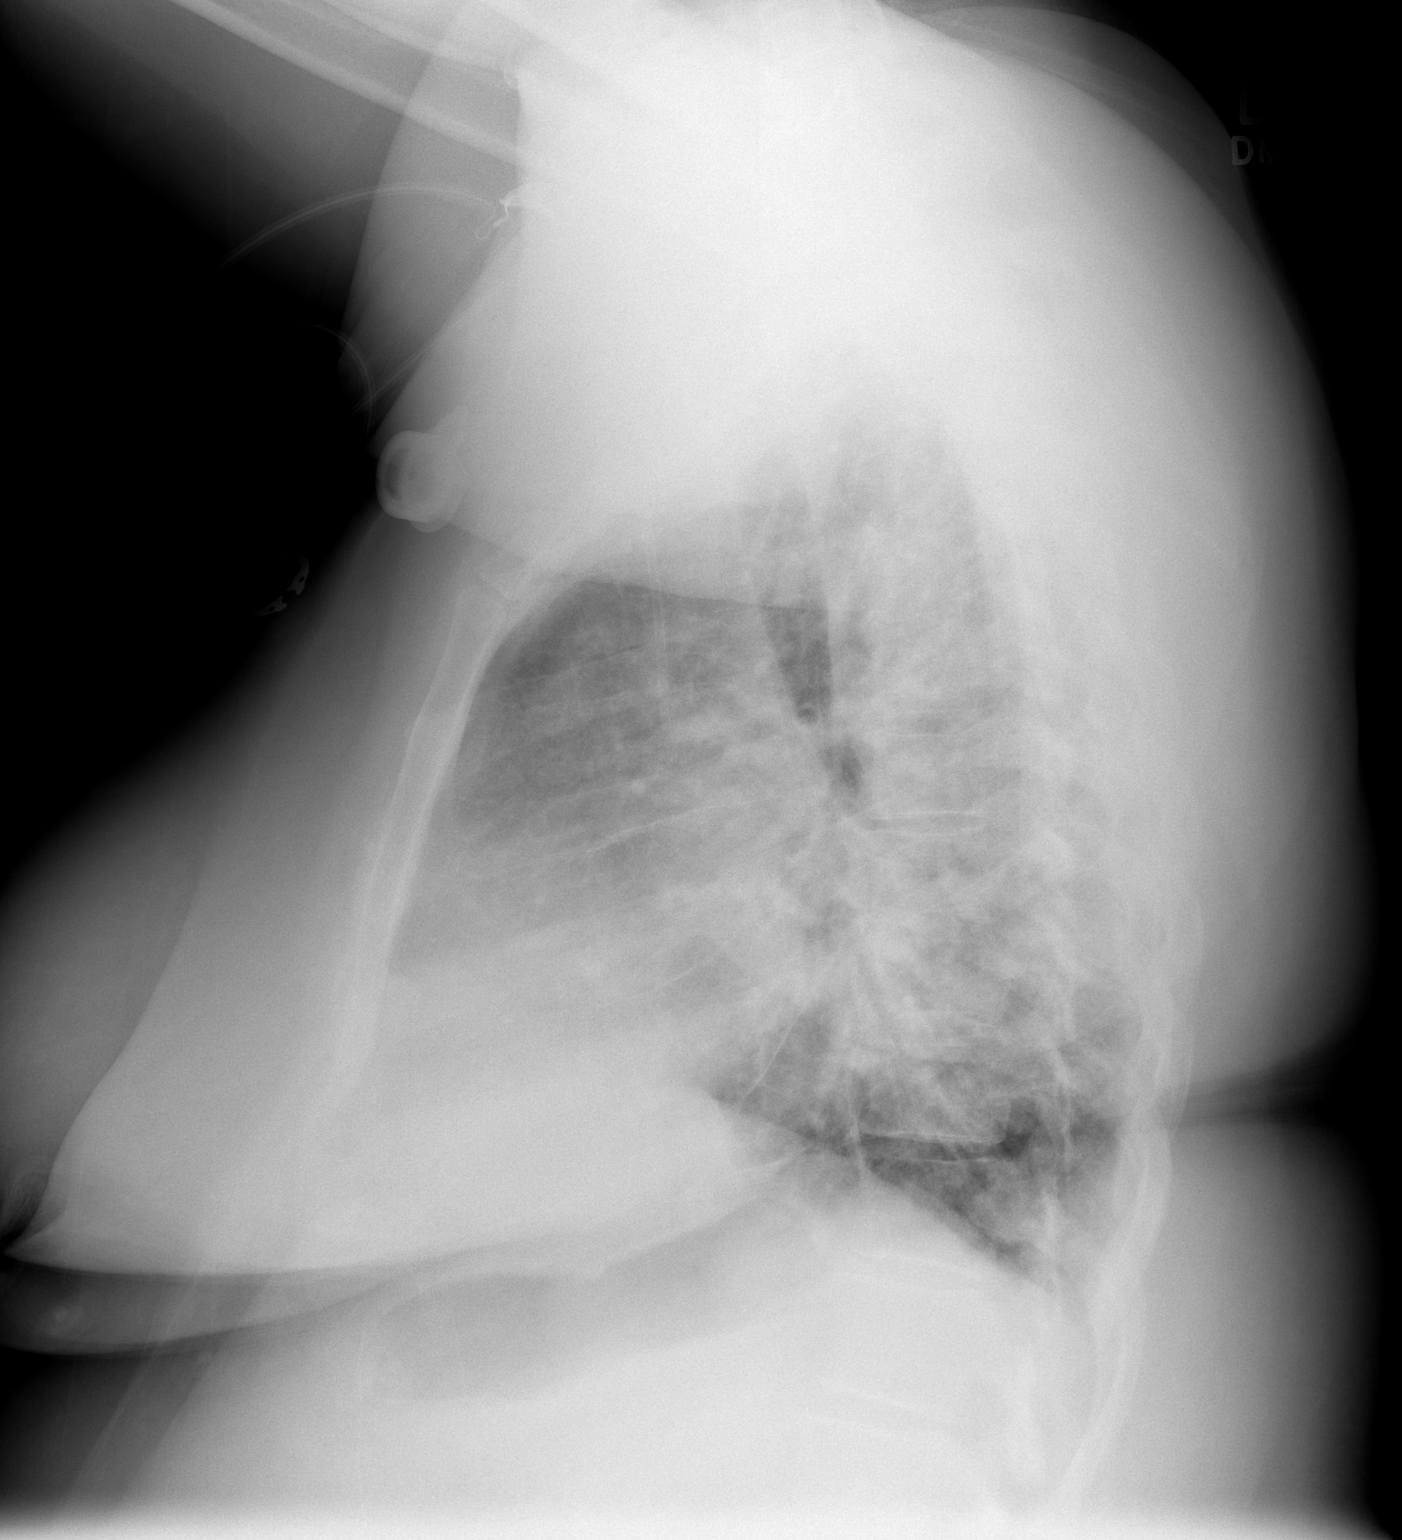

[2 of 2 positions shown; findings below may reference images not displayed]

FINDINGS: Stable enlarged cardiac silhouette. Diffusely prominent interstitial
markings and pulmonary vasculature with mild improvement. No pleural
fluid. Stable left subclavian port catheter. Bilateral AC joint
degenerative changes.
IMPRESSION: Mildly improved changes of congestive heart failure with stable
cardiomegaly.

## 2015-09-28 ENCOUNTER — Encounter: Payer: Self-pay | Admitting: Internal Medicine

## 2015-09-29 ENCOUNTER — Encounter: Payer: Self-pay | Admitting: Internal Medicine

## 2015-10-07 ENCOUNTER — Encounter: Payer: Self-pay | Admitting: Internal Medicine

## 2015-10-07 LAB — ICD DEVICE OBSERVATION

## 2016-01-14 ENCOUNTER — Encounter: Payer: Self-pay | Admitting: Internal Medicine

## 2016-01-14 LAB — ICD DEVICE OBSERVATION

## 2016-02-28 ENCOUNTER — Encounter (HOSPITAL_COMMUNITY): Payer: Self-pay | Admitting: Internal Medicine

## 2016-02-28 ENCOUNTER — Ambulatory Visit (HOSPITAL_COMMUNITY)
Admission: RE | Admit: 2016-02-28 | Discharge: 2016-02-28 | Disposition: A | Discharge status: Home / Self Care | Payer: BLUE CROSS/BLUE SHIELD | Source: Ambulatory Visit | Attending: Internal Medicine | Admitting: Internal Medicine

## 2016-02-28 ENCOUNTER — Telehealth (HOSPITAL_COMMUNITY): Payer: Self-pay | Admitting: Vascular Surgery

## 2016-02-28 VITALS — BP 132/84 | HR 64 | Wt 227.5 lb

## 2016-02-28 DIAGNOSIS — G4733 Obstructive sleep apnea (adult) (pediatric): Secondary | ICD-10-CM | POA: Insufficient documentation

## 2016-02-28 DIAGNOSIS — E042 Nontoxic multinodular goiter: Secondary | ICD-10-CM | POA: Diagnosis not present

## 2016-02-28 DIAGNOSIS — I429 Cardiomyopathy, unspecified: Secondary | ICD-10-CM | POA: Insufficient documentation

## 2016-02-28 DIAGNOSIS — I110 Hypertensive heart disease with heart failure: Secondary | ICD-10-CM | POA: Diagnosis not present

## 2016-02-28 DIAGNOSIS — I5042 Chronic combined systolic (congestive) and diastolic (congestive) heart failure: Secondary | ICD-10-CM

## 2016-02-28 DIAGNOSIS — Z8572 Personal history of non-Hodgkin lymphomas: Secondary | ICD-10-CM | POA: Insufficient documentation

## 2016-02-28 DIAGNOSIS — E669 Obesity, unspecified: Secondary | ICD-10-CM | POA: Insufficient documentation

## 2016-02-28 DIAGNOSIS — I5022 Chronic systolic (congestive) heart failure: Secondary | ICD-10-CM | POA: Insufficient documentation

## 2016-02-28 DIAGNOSIS — Z8249 Family history of ischemic heart disease and other diseases of the circulatory system: Secondary | ICD-10-CM | POA: Diagnosis not present

## 2016-02-28 DIAGNOSIS — Z9884 Bariatric surgery status: Secondary | ICD-10-CM | POA: Insufficient documentation

## 2016-02-28 DIAGNOSIS — I10 Essential (primary) hypertension: Secondary | ICD-10-CM | POA: Diagnosis not present

## 2016-02-28 LAB — BASIC METABOLIC PANEL
ANION GAP: 6 (ref 5–15)
BUN: 14 mg/dL (ref 6–20)
CHLORIDE: 107 mmol/L (ref 101–111)
CO2: 25 mmol/L (ref 22–32)
Calcium: 9.3 mg/dL (ref 8.9–10.3)
Creatinine, Ser: 0.85 mg/dL (ref 0.44–1.00)
GFR calc non Af Amer: >60 mL/min (ref >60)
GLUCOSE: 100 mg/dL — AB (ref 65–99)
POTASSIUM: 4.2 mmol/L (ref 3.5–5.1)
Sodium: 138 mmol/L (ref 135–145)

## 2016-02-28 LAB — CBC
HEMATOCRIT: 41.5 % (ref 36.0–46.0)
HEMOGLOBIN: 13.2 g/dL (ref 12.0–15.0)
MCH: 29.1 pg (ref 26.0–34.0)
MCHC: 31.8 g/dL (ref 30.0–36.0)
MCV: 91.4 fL (ref 78.0–100.0)
Platelets: 301 10*3/uL (ref 150–400)
RBC: 4.54 MIL/uL (ref 3.87–5.11)
RDW: 13 % (ref 11.5–15.5)
WBC: 5.5 10*3/uL (ref 4.0–10.5)

## 2016-02-28 LAB — TSH: TSH: 0.795 u[IU]/mL (ref 0.350–4.500)

## 2016-02-28 LAB — BRAIN NATRIURETIC PEPTIDE: B Natriuretic Peptide: 15.9 pg/mL (ref 0.0–100.0)

## 2016-02-28 NOTE — Telephone Encounter (Signed)
Sent Darlene Pierce message to call pt to schedule echo/ sent [name] message to call pt to make appt w/ EP

## 2016-02-28 NOTE — Progress Notes (Signed)
Advanced Heart Failure Clinic Note   Referring Physician: Self referral ( Previous patient) Primary Cardiologist: Janellie Tennison  HPI:  Darlene Pierce is a 50 y.o. female with history of NICM, HTN, non-Hodgkin's Lymphoma (radiation in 2000, chemotherapy in 2012), OSA and chronic systolic CHF. She was treated with doxorubicin, vincistine, cyclophospamide and rituxamab.   LHC 12/08/11 with normal coronary arteries.  Echo 05/26/13: Mild LVH, EF 45-50%, Gr 1 DD, mild LAE, RVSP 27.  Echo 09/18/13: LVEF 25-30%, Grade 1 DD  Admitted [facility] 08/2013 with ADHF. EF dropped from 45 -> 25 as above. Diuresed with IV lasix. Required short term dobutamine for hypotension and AKI.   Pt moved to [place] shortly after hospitalization 09/2013 was followed at [facility]. CRT-D ([employer]). Says last EF was in 40-45%.  Underwent gastric sleeve on 03/24/15. Lost 60 pounds.   She presents today to re-establish with HF clinic. Feels good. Does all activity without too much problem. Says she can do a lot more. No edema, orthopnea, PND. Weight stable. ICD hasn't fired.     Review of Systems: [y] = yes, [ ]  = no   General: Weight gain [ ] ; Weight loss [ ] ; Anorexia [ ] ; Fatigue [ ] ; Fever [ ] ; Chills [ ] ; Weakness [ ]   Cardiac: Chest pain/pressure [ ] ; Resting SOB [ ] ; Exertional SOB [ ] ; Orthopnea [ ] ; Pedal Edema [ ] ; Palpitations [ ] ; Syncope [ ] ; Presyncope [ ] ; Paroxysmal nocturnal dyspnea[ ]   Pulmonary: Cough [ ] ; Wheezing[ ] ; Hemoptysis[ ] ; Sputum [ ] ; Snoring [ ]   GI: Vomiting[ ] ; Dysphagia[ ] ; Melena[ ] ; Hematochezia [ ] ; Heartburn[ ] ; Abdominal pain [ ] ; Constipation [ ] ; Diarrhea [ ] ; BRBPR [ ]   GU: Hematuria[ ] ; Dysuria [ ] ; Nocturia[ ]   Vascular: Pain in legs with walking [ ] ; Pain in feet with lying flat [ ] ; Non-healing sores [ ] ; Stroke [ ] ; TIA [ ] ; Slurred speech [ ] ;  Neuro: Headaches[ ] ; Vertigo[ ] ; Seizures[ ] ; Paresthesias[ ] ;Blurred vision [ ] ; Diplopia [ ] ; Vision changes [ ]   Ortho/Skin: Arthritis [  ]; Joint pain [ ] ; Muscle pain [ ] ; Joint swelling [ ] ; Back Pain [ ] ; Rash [ ]   Psych: Depression[ ] ; Anxiety[ ]   Heme: Bleeding problems [ ] ; Clotting disorders [ ] ; Anemia [ ]   Endocrine: Diabetes [ ] ; Thyroid dysfunction[ ]    Past Medical History:  Diagnosis Date  . CHF (congestive heart failure) ([place])   . H/O angiography    unsure of any PCI  . Hypertension   . Lymphoma ([place])    s/p chemotherapy 2012, radiation in 2000  . Multiple thyroid nodules   . Non-ischemic cardiomyopathy ([place])   . OSA (obstructive sleep apnea)    a. mild by sleep study 08/2013 - trial of weight loss;  refer to sleep med if unsucessful  . Respiratory failure ([place])    was [facility] and was on ventilator    Current Outpatient Prescriptions  Medication Sig Dispense Refill  . carvedilol (COREG) 6.25 MG tablet Take 1 tablet (6.25 mg total) by mouth 2 (two) times daily with a meal. 60 tablet 6  . Cholecalciferol (VITAMIN D) 2000 UNITS tablet Take 2,000 Units by mouth daily.    Marland Kitchen DIGOX 125 MCG tablet TAKE 1 TABLET BY MOUTH EVERY DAY 30 tablet 0  . gabapentin (NEURONTIN) 100 MG capsule Take 100 mg by mouth daily.    . Iron-Folic Acid-Vit 123456 (IRON FORMULA) 27-400-100 MG-MCG-MCG CAPS Take  1 tablet by mouth daily.    . sacubitril-valsartan (ENTRESTO) 24-26 MG Take 1 tablet by mouth 2 (two) times daily.    Marland Kitchen spironolactone (ALDACTONE) 25 MG tablet Take 25 mg by mouth daily.    Marland Kitchen torsemide (DEMADEX) 20 MG tablet Take 20 mg by mouth 2 (two) times daily.    Marland Kitchen albuterol (PROVENTIL HFA;VENTOLIN HFA) 108 (90 BASE) MCG/ACT inhaler Inhale 2 puffs into the lungs every 6 (six) hours as needed for wheezing or shortness of breath. (Patient not taking: Reported on 02/28/2016) 1 Inhaler 2   No current facility-administered medications for this encounter.     No Known Allergies    Social History   Social History  . Marital status: Single    Spouse name: N/A  . Number of children: 4  . Years of  education: N/A   Occupational History  .  [employer]   Social History Main Topics  . Smoking status: Never Smoker  . Smokeless tobacco: Never Used  . Alcohol use 0.6 oz/week    1 Glasses of wine per week     Comment: Occasional  . Drug use: No  . Sexual activity: Not on file   Other Topics Concern  . Not on file   Social History Narrative  . No narrative on file      Family History  Problem Relation Age of Onset  . CAD Mother     Vitals:   02/28/16 1148  BP: 132/84  Pulse: 64  SpO2: 100%  Weight: 227 lb 8 oz (103.2 kg)    PHYSICAL EXAM: General:  Well appearing. No respiratory difficulty HEENT: normal Neck: supple. no JVD. Carotids 2+ bilat; no bruits. No lymphadenopathy appreciated. Mild goiter Cor: PMI nondisplaced. Regular rate & rhythm. 2/6 SEM at RUSB  R-sided ICD Lungs: clear Abdomen: soft, nontender, nondistended. No hepatosplenomegaly. No bruits or masses. Good bowel sounds. Extremities: no cyanosis, clubbing, rash, edema Neuro: alert & oriented x 3, cranial nerves grossly intact. moves all 4 extremities w/o difficulty. Affect pleasant.   ECG: NSR 64. Bivpaced.   ASSESSMENT & PLAN:  1. Chronic systolic HF: NICM, likely chemo induced (Doxorubicin) or HTN - EF 20-25% (08/2013). Most recent EF 40-45% (at [facility]) . S/p StJ CRT-D - NYHA I-II. Volume status looks good.  - Continue carvedilol, spiro. - Titrate Entresto to 49/51 bid - Stop digoxin - Echo  - Refer to EP for device f/u.  2. HTN - Improved. Will increase Entresto today as well.  3. Non-Hodgkins Lymphoma 4. Obesity  - Lost 65 pounds s/p gastric sleeve 5. Multinodular goiter - Will follow up with PCP.   Will need paperwork from [place].  Last EF we have on file 20-25% (08/2013)

## 2016-02-28 NOTE — Patient Instructions (Signed)
STOP Digoxin.  INCREASE Entresto to 49/51 mg : Take one tablet twice daily. Can "double up" on the 24/26mg  tablets until you run out (2 tabs twice daily).  Routine lab work today. Will notify you of abnormal results, otherwise no news is good news!  Will refer you to electrophysiology at [facility]. Address: [address] ([place]), [place], [place] [address]  Phone: [phone]  Follow up 3 months with Dr. Haroldine Laws.  Do the following things EVERYDAY: 1) Weigh yourself in the morning before breakfast. Write it down and keep it in a log. 2) Take your medicines as prescribed 3) Eat low salt foods-Limit salt (sodium) to 2000 mg per day.  4) Stay as active as you can everyday 5) Limit all fluids for the day to less than 2 liters

## 2016-02-28 NOTE — Addendum Note (Signed)
Encounter addended by: Effie Berkshire, RN on: 02/28/2016 12:36 PM<BR>    Actions taken: Medication long-term status modified, Order Entry activity accessed, Diagnosis association updated, Sign clinical note

## 2016-03-10 ENCOUNTER — Other Ambulatory Visit: Payer: Self-pay

## 2016-03-10 ENCOUNTER — Ambulatory Visit (HOSPITAL_COMMUNITY): Payer: BLUE CROSS/BLUE SHIELD | Attending: Cardiology

## 2016-03-10 DIAGNOSIS — I5042 Chronic combined systolic (congestive) and diastolic (congestive) heart failure: Secondary | ICD-10-CM | POA: Insufficient documentation

## 2016-03-15 ENCOUNTER — Ambulatory Visit (INDEPENDENT_AMBULATORY_CARE_PROVIDER_SITE_OTHER): Payer: BLUE CROSS/BLUE SHIELD | Admitting: Internal Medicine

## 2016-03-15 ENCOUNTER — Encounter: Payer: Self-pay | Admitting: Internal Medicine

## 2016-03-15 VITALS — BP 122/94 | HR 64 | Ht 66.0 in | Wt 225.2 lb

## 2016-03-15 DIAGNOSIS — Z95810 Presence of automatic (implantable) cardiac defibrillator: Secondary | ICD-10-CM | POA: Diagnosis not present

## 2016-03-15 DIAGNOSIS — I428 Other cardiomyopathies: Secondary | ICD-10-CM

## 2016-03-15 DIAGNOSIS — I5042 Chronic combined systolic (congestive) and diastolic (congestive) heart failure: Secondary | ICD-10-CM

## 2016-03-15 LAB — CUP PACEART INCLINIC DEVICE CHECK
Battery Remaining Longevity: 56.4
Brady Statistic RV Percent Paced: 98 %
Date Time Interrogation Session: 20171025151234
HighPow Impedance: 65.25 Ohm
Implantable Lead Implant Date: 20150715
Implantable Lead Location: 753859
Implantable Lead Location: 753860
Lead Channel Impedance Value: 612.5 Ohm
Lead Channel Pacing Threshold Amplitude: 0.875 V
Lead Channel Pacing Threshold Pulse Width: 0.4 ms
Lead Channel Sensing Intrinsic Amplitude: 12 mV
Lead Channel Sensing Intrinsic Amplitude: 4.8 mV
Lead Channel Setting Pacing Amplitude: 2 V
Lead Channel Setting Pacing Amplitude: 2 V
Lead Channel Setting Pacing Amplitude: 2 V
Lead Channel Setting Pacing Pulse Width: 0.4 ms
Lead Channel Setting Pacing Pulse Width: 0.4 ms
MDC IDC LEAD IMPLANT DT: 20150715
MDC IDC LEAD IMPLANT DT: 20150715
MDC IDC LEAD LOCATION: 753858
MDC IDC MSMT LEADCHNL LV PACING THRESHOLD AMPLITUDE: 1 V
MDC IDC MSMT LEADCHNL LV PACING THRESHOLD PULSEWIDTH: 0.4 ms
MDC IDC MSMT LEADCHNL RA IMPEDANCE VALUE: 475 Ohm
MDC IDC MSMT LEADCHNL RA PACING THRESHOLD AMPLITUDE: 0.5 V
MDC IDC MSMT LEADCHNL RA PACING THRESHOLD PULSEWIDTH: 0.4 ms
MDC IDC MSMT LEADCHNL RV IMPEDANCE VALUE: 400 Ohm
MDC IDC PG SERIAL: 7198577
MDC IDC SET LEADCHNL RV SENSING SENSITIVITY: 0.4 mV
MDC IDC STAT BRADY RA PERCENT PACED: 12 %

## 2016-03-15 MED ORDER — SACUBITRIL-VALSARTAN 49-51 MG PO TABS: 1.0000 | ORAL_TABLET | Freq: Two times a day (BID) | ORAL | Status: DC

## 2016-03-15 NOTE — Progress Notes (Signed)
Primary Cardiologist:  [name] is a 50 y.o. female with a h/o a nonischemic CM, HTN, and BiV ICD implantation in [place] who presents today to establish care in the Electrophysiology device clinic.  She has had reduced EF for years, felt to be nonischemic in nature.  She moved to [place] in 2015 to be near family.  She underwent SJM BiV ICD implantion at [facility] in [place].  She has done well since her device was implanted.  She reports significant improvement in exercise tolerance with CRT.  She reports that with initial implant, she had to have revision the following day due to lead dislodgement.  She denies any problems with her device since that time. The patient reports doing very well since having a pacemaker implanted and remains very active despite her age.   Today, she  denies symptoms of palpitations, chest pain, shortness of breath, orthopnea, PND, lower extremity edema, dizziness, presyncope, syncope, ICD shocks, or neurologic sequela.  The patientis tolerating medications without difficulties and is otherwise without complaint today.   Past Medical History:  Diagnosis Date  . CHF (congestive heart failure) ([place])   . H/O angiography    unsure of any PCI  . Hypertension   . Lymphoma ([place])    s/p chemotherapy 2012, radiation in 2000  . Multiple thyroid nodules   . Non-ischemic cardiomyopathy ([place])   . OSA (obstructive sleep apnea)    a. mild by sleep study 08/2013 - trial of weight loss;  refer to sleep med if unsucessful  . Respiratory failure ([place])    was admitted in [place] [facility] and was on ventilator   Past Surgical History:  Procedure Laterality Date  . BI-VENTRICULAR IMPLANTABLE CARDIOVERTER DEFIBRILLATOR  (CRT-D) Right 12/03/2013   SJM Dorma Russell Assura implanted in [place] for primary prevention  . CHOLECYSTECTOMY    . FEMUR SURGERY    . KNEE SURGERY    . porta cath insertion     2 years  . RIGHT HEART CATHETERIZATION N/A 09/19/2013   Procedure: RIGHT HEART CATH;  Surgeon: Darlene Artist, MD;  Location: [facility] CATH LAB;  Service: Cardiovascular;  Laterality: N/A;  . TUBAL LIGATION      Social History   Social History  . Marital status: Single    Spouse name: N/A  . Number of children: 4  . Years of education: N/A   Occupational History  .  [employer]   Social History Main Topics  . Smoking status: Never Smoker  . Smokeless tobacco: Never Used  . Alcohol use 0.6 oz/week    1 Glasses of wine per week     Comment: Occasional  . Drug use: No  . Sexual activity: Not on file   Other Topics Concern  . Not on file   Social History Narrative  . No narrative on file    Family History  Problem Relation Age of Onset  . CAD Mother     No Known Allergies  Current Outpatient Prescriptions  Medication Sig Dispense Refill  . albuterol (PROVENTIL HFA;VENTOLIN HFA) 108 (90 BASE) MCG/ACT inhaler Inhale 2 puffs into the lungs every 6 (six) hours as needed for wheezing or shortness of breath. 1 Inhaler 2  . carvedilol (COREG) 6.25 MG tablet Take 1 tablet (6.25 mg total) by mouth 2 (two) times daily with a meal. 60 tablet 6  . Cholecalciferol (VITAMIN D) 2000 UNITS tablet Take 2,000 Units by mouth daily.    Marland Kitchen gabapentin (  NEURONTIN) 100 MG capsule Take 100 mg by mouth daily.    . Iron-Folic Acid-Vit 123456 (IRON FORMULA) 27-400-100 MG-MCG-MCG CAPS Take 1 tablet by mouth daily.    Marland Kitchen spironolactone (ALDACTONE) 25 MG tablet Take 25 mg by mouth daily.    Marland Kitchen torsemide (DEMADEX) 20 MG tablet Take 20 mg by mouth 2 (two) times daily.    . sacubitril-valsartan (ENTRESTO) 49-51 MG Take 1 tablet by mouth 2 (two) times daily. 60 tablet    No current facility-administered medications for this visit.     ROS- all systems are reviewed and negative except as per HPI  Physical Exam: Vitals:   03/15/16 1109  BP: (!) 122/94  Pulse: 64  SpO2: 98%  Weight: 225 lb 3.2 oz (102.2 kg)  Height: 5\' 6"  (1.676 m)    GEN- The patient  is overweight appearing, alert and oriented x 3 today.   Head- normocephalic, atraumatic Eyes-  Sclera clear, conjunctiva pink Ears- hearing intact Oropharynx- clear Neck- supple, no JVP Lungs- Clear to ausculation bilaterally, normal work of breathing Chest- ICD pocket is well healed (R sided) Heart- Regular rate and rhythm, no murmurs, rubs or gallops, PMI not laterally displaced GI- soft, NT, ND, + BS Extremities- no clubbing, cyanosis, or edema MS- no significant deformity or atrophy Skin- no rash or lesion Psych- euthymic mood, full affect Neuro- strength and sensation are intact  Pacemaker interrogation- reviewed in detail today,  See PACEART report Recent ekg from CHF clinic reveals sinus rhythm with BiV pacing Echo 03/10/16 reveals EF 35-40%  Assessment and Plan:  The patient has a nonischemic CM.  She has clinically improved with CRT.  Doing well with normal BiV ICD function at this time. No changes are made today.  See paceart. I have discussed SJM Fortify Assura advisary with the patient today. She understands that recommendation from SJM is to not replace the device at this time. The patient is not device dependant.  The patient has not had appropriate device therapy in the past or implanted for secondary prevention.  Vibratory alert demonstrated today.  She is actively remotely monitored and understands the importance of compliance today.  We have agreed today that we will follow conservatively rather than consider device replacement at this time.  Follow with Merlin Enroll in ICM device clinic Return to see EP NP in 1 year.  Darlene Grayer MD, [facility] 03/15/2016 8:09 PM

## 2016-03-15 NOTE — Patient Instructions (Addendum)
Medication Instructions:  Your physician recommends that you continue on your current medications as directed. Please refer to the Current Medication list given to you today.   Labwork: None ordered   Testing/Procedures: None ordered   Follow-Up: Your physician wants you to follow-up in: 12 months with Chanetta Marshall, NP You will receive a reminder letter in the mail two months in advance. If you don't receive a letter, please call our office to schedule the follow-up appointment.  Remote monitoring is used to monitor your ICD from home. This monitoring reduces the number of office visits required to check your device to one time per year. It allows [place] to keep an eye on the functioning of your device to ensure it is working properly. You are scheduled for a device check from home on 06/15/15. You may send your transmission at any time that day. If you have a wireless device, the transmission will be sent automatically. After your physician reviews your transmission, you will receive a postcard with your next transmission date.

## 2016-03-24 ENCOUNTER — Telehealth: Payer: Self-pay

## 2016-03-24 NOTE — Telephone Encounter (Signed)
Referred to ICM clinic by Dr Rayann Heman.  Call to patient and explained ICM program.  She agreed to [facility] monthly ICM follow up.  She stated she is feeling good today.  Explained I will schedule the 1st ICM remote transmission when her name is released in [place] from the previous device clinic that was following her.  She stated that would be fine.  Provided direct ICM number and encouraged to call for any symptoms.  Patient's primary cardiologist is Dr Haroldine Laws.

## 2016-04-25 ENCOUNTER — Telehealth (HOSPITAL_COMMUNITY): Payer: Self-pay | Admitting: *Deleted

## 2016-04-25 MED ORDER — CARVEDILOL 25 MG PO TABS: 25.0000 mg | ORAL_TABLET | Freq: Two times a day (BID) | ORAL | 3 refills | Status: DC

## 2016-04-25 MED ORDER — DIGOXIN 125 MCG PO TABS: 0.1250 mg | ORAL_TABLET | Freq: Every day | ORAL | 3 refills | Status: DC

## 2016-04-25 MED ORDER — TORSEMIDE 20 MG PO TABS: 20.0000 mg | ORAL_TABLET | Freq: Two times a day (BID) | ORAL | 3 refills | Status: DC

## 2016-04-25 MED ORDER — SACUBITRIL-VALSARTAN 97-103 MG PO TABS: 1.0000 | ORAL_TABLET | Freq: Two times a day (BID) | ORAL | 3 refills | Status: DC

## 2016-04-25 NOTE — Telephone Encounter (Signed)
Patient needed refills for the following:  Entresto 97/103 Torsemide 20mg  Coreg 25 mg Digoxin 0.125  [facility] in [place] to verify dosages as they did not match what we had listed in patient's chart.  Patient stated those are the correct dosages she has been on.  And when she came to her appointment here she did not have her correct list of medications.  Since these are the doses pt has been on, refills sent in and med list updated.

## 2016-05-02 ENCOUNTER — Telehealth (HOSPITAL_COMMUNITY): Payer: Self-pay | Admitting: Pharmacist

## 2016-05-02 NOTE — Telephone Encounter (Signed)
Entresto 97-103 mg PA approved by [employer] through 05/02/17.   [name]. [name], PharmD, BCPS, CPP Clinical Pharmacist Pager: [phone] Phone: [phone] 05/02/2016 2:33 PM

## 2016-05-03 ENCOUNTER — Telehealth (HOSPITAL_COMMUNITY): Payer: Self-pay | Admitting: *Deleted

## 2016-05-03 NOTE — Telephone Encounter (Signed)
Patient called stating she couldn't afford entresto and wanted to change medications.  Spoke with Doroteo Bradford and it was approved and would only cost the patient $3.70 at the [facility] in [place]. Patient is aware and will pick up medication today.

## 2016-05-04 ENCOUNTER — Telehealth: Payer: Self-pay

## 2016-05-04 NOTE — Telephone Encounter (Signed)
Attempted ICM call to patient to advise 1st ICM remote transmission is scheduled for 05/12/2016.  Patient was previously followed by a different device clinic.  Recording stated not accepting calls at this time.

## 2016-05-10 ENCOUNTER — Telehealth: Payer: Self-pay | Admitting: Cardiology

## 2016-05-10 NOTE — Telephone Encounter (Signed)
Spoke w/ pt and requested that she send a manual transmission b/c her home monitor has not updated in at least 7 days.   

## 2016-05-12 ENCOUNTER — Telehealth: Payer: Self-pay | Admitting: Cardiology

## 2016-05-12 NOTE — Telephone Encounter (Signed)
Attempted to confirm remote transmission with pt. No answer and was unable to leave a message.   

## 2016-05-12 NOTE — Progress Notes (Signed)
No ICM remote transmission received for 05/11/2016 and next ICM transmission scheduled for 06/14/2016 for 1st ICM transmission.

## 2016-06-14 ENCOUNTER — Ambulatory Visit (INDEPENDENT_AMBULATORY_CARE_PROVIDER_SITE_OTHER): Payer: BLUE CROSS/BLUE SHIELD | Admitting: *Deleted

## 2016-06-14 DIAGNOSIS — I428 Other cardiomyopathies: Secondary | ICD-10-CM | POA: Diagnosis not present

## 2016-06-14 DIAGNOSIS — Z95810 Presence of automatic (implantable) cardiac defibrillator: Secondary | ICD-10-CM

## 2016-06-14 DIAGNOSIS — I5042 Chronic combined systolic (congestive) and diastolic (congestive) heart failure: Secondary | ICD-10-CM | POA: Diagnosis not present

## 2016-06-14 NOTE — Progress Notes (Signed)
Remote ICD transmission.   

## 2016-06-15 ENCOUNTER — Encounter: Payer: Self-pay | Admitting: Cardiology

## 2016-06-15 LAB — CUP PACEART REMOTE DEVICE CHECK
Battery Remaining Longevity: 53 mo
Battery Remaining Percentage: 62 %
Battery Voltage: 2.95 V
Brady Statistic AP VP Percent: 3.5 %
Brady Statistic AP VS Percent: 1 %
Brady Statistic AS VP Percent: 93 %
Brady Statistic AS VS Percent: 2.7 %
Brady Statistic RA Percent Paced: 3.7 %
Date Time Interrogation Session: 20180124090017
HighPow Impedance: 70 Ohm
HighPow Impedance: 70 Ohm
Implantable Lead Implant Date: 20150715
Implantable Lead Implant Date: 20150715
Implantable Lead Implant Date: 20150715
Implantable Lead Location: 753858
Implantable Lead Location: 753859
Implantable Lead Location: 753860
Implantable Pulse Generator Implant Date: 20150715
Lead Channel Impedance Value: 410 Ohm
Lead Channel Impedance Value: 440 Ohm
Lead Channel Impedance Value: 590 Ohm
Lead Channel Pacing Threshold Amplitude: 0.5 V
Lead Channel Pacing Threshold Amplitude: 1 V
Lead Channel Pacing Threshold Amplitude: 1.375 V
Lead Channel Pacing Threshold Pulse Width: 0.4 ms
Lead Channel Pacing Threshold Pulse Width: 0.4 ms
Lead Channel Pacing Threshold Pulse Width: 0.4 ms
Lead Channel Sensing Intrinsic Amplitude: 11.7 mV
Lead Channel Sensing Intrinsic Amplitude: 3.1 mV
Lead Channel Setting Pacing Amplitude: 2 V
Lead Channel Setting Pacing Amplitude: 2 V
Lead Channel Setting Pacing Amplitude: 2.375
Lead Channel Setting Pacing Pulse Width: 0.4 ms
Lead Channel Setting Pacing Pulse Width: 0.4 ms
Lead Channel Setting Sensing Sensitivity: 0.4 mV
Pulse Gen Serial Number: 7198577

## 2016-06-16 NOTE — Progress Notes (Signed)
EPIC Encounter for ICM Monitoring  Patient Name: Darlene Pierce is a 51 y.o. female Date: 06/16/2016 Primary Care Physican: No PCP Per Patient Primary Cardiologist: [place] Electrophysiologist: Allred Dry Weight: 227 lbs  Bi-V Pacing:  97%       1st ICM encounter.  Heart Failure questions reviewed, pt asymptomatic   Thoracic impedance normal. She did miss a few days of taking Torsemide due to pharmacy mix up.   Recommendations: No changes. Reminded to limit dietary salt intake to 2000 mg/day and fluid intake to < 2 liters/day. Encouraged to call for fluid symptoms.  Follow-up plan: ICM clinic phone appointment on 07/17/2016.  Copy of ICM check sent to device physician.   3 month ICM trend: 06/14/2016   1 Year ICM trend:      [name], RN 06/16/2016 1:14 PM

## 2016-07-17 ENCOUNTER — Ambulatory Visit (INDEPENDENT_AMBULATORY_CARE_PROVIDER_SITE_OTHER): Payer: BLUE CROSS/BLUE SHIELD

## 2016-07-17 DIAGNOSIS — I5042 Chronic combined systolic (congestive) and diastolic (congestive) heart failure: Secondary | ICD-10-CM

## 2016-07-17 DIAGNOSIS — Z95810 Presence of automatic (implantable) cardiac defibrillator: Secondary | ICD-10-CM

## 2016-07-17 NOTE — Progress Notes (Signed)
EPIC Encounter for ICM Monitoring  Patient Name: Darlene Pierce is a 51 y.o. female Date: 07/17/2016 Primary Care Physican: No PCP Per Patient Primary Cardiologist: [place] Electrophysiologist: Allred Dry Weight:  220 lbs  Bi-V Pacing:  97%              Heart Failure questions reviewed, pt asymptomatic    Thoracic impedance abnormal suggesting fluid accumulation since 06/29/2016 with the exception of a few days at baseline. Direct trendviewer through 2/26 showing impedance is starting back toward baseline.  Prescribed and confirmed dosage: Torsemide 20 mg 1 tablet bid  Labs: 02/28/2016 Creatinine 0.85, BUN 14, Potassium 4.2, Sodium 138, EGFR >60  Recommendations:  Advised to take Torsemide 2 tablets in am and 2 tablets in pm x 2 days and then return to prior dosage of 1 tablet twice a day.  Follow-up plan: ICM clinic phone appointment on 07/20/2016 to recheck fluid levels.  Copy of ICM check sent to primary cardiologist and device physician.   3 month ICM trend: 07/17/2016   Direct trendviewer through 07/17/2016     1 Year ICM trend:      [name], RN 07/17/2016 8:26 AM

## 2016-07-20 ENCOUNTER — Ambulatory Visit (INDEPENDENT_AMBULATORY_CARE_PROVIDER_SITE_OTHER): Payer: BLUE CROSS/BLUE SHIELD

## 2016-07-20 ENCOUNTER — Telehealth: Payer: Self-pay

## 2016-07-20 ENCOUNTER — Telehealth: Payer: Self-pay | Admitting: Cardiology

## 2016-07-20 DIAGNOSIS — I5042 Chronic combined systolic (congestive) and diastolic (congestive) heart failure: Secondary | ICD-10-CM

## 2016-07-20 DIAGNOSIS — Z95810 Presence of automatic (implantable) cardiac defibrillator: Secondary | ICD-10-CM

## 2016-07-20 NOTE — Telephone Encounter (Signed)
Spoke with pt and reminded pt of remote transmission that is due today. Pt verbalized understanding.   

## 2016-07-20 NOTE — Progress Notes (Signed)
EPIC Encounter for ICM Monitoring  Patient Name: Darlene Pierce is a 51 y.o. female Date: 07/20/2016 Primary Care Physican: No PCP Per Patient Primary Cardiologist: [place] Electrophysiologist: Allred Dry Weight:unknown Bi-V Pacing: 97%      Attempted call to patient and unable to reach.  Left message to return call.   Transmission reviewed.    Thoracic impedance returned to normal and above baseline which correlates with taking extra Torsemide x 2 days.   Prescribed dosage: Torsemide 20 mg 1 tablet bid  Labs: 02/28/2016 Creatinine 0.85, BUN 14, Potassium 4.2, Sodium 138, EGFR >60  Recommendations: NONE - Unable to reach patient   Follow-up plan: ICM clinic phone appointment on 08/17/2016.  Copy of ICM check sent to device physician.   3 month ICM trend: 07/20/2016   1 Year ICM trend:      Laurie S Short, RN 07/20/2016 4:22 PM    

## 2016-07-20 NOTE — Telephone Encounter (Signed)
Remote ICM transmission received.  Attempted patient call and left message to return call.   

## 2016-07-21 NOTE — Progress Notes (Signed)
Patient returned call.  She stated she is doing fine and transmission reviewed.  Advised the fluid resolved by taking extra Torsemide.  No changes today and next ICM remote transmission 08/17/2016.

## 2016-08-01 ENCOUNTER — Ambulatory Visit (INDEPENDENT_AMBULATORY_CARE_PROVIDER_SITE_OTHER): Payer: BLUE CROSS/BLUE SHIELD | Admitting: Podiatry

## 2016-08-01 ENCOUNTER — Encounter: Payer: Self-pay | Admitting: Podiatry

## 2016-08-01 VITALS — BP 122/98 | HR 70 | Ht 66.5 in | Wt 219.0 lb

## 2016-08-01 DIAGNOSIS — B351 Tinea unguium: Secondary | ICD-10-CM

## 2016-08-01 DIAGNOSIS — M216X1 Other acquired deformities of right foot: Secondary | ICD-10-CM

## 2016-08-01 DIAGNOSIS — M21969 Unspecified acquired deformity of unspecified lower leg: Secondary | ICD-10-CM

## 2016-08-01 DIAGNOSIS — M659 Synovitis and tenosynovitis, unspecified: Secondary | ICD-10-CM | POA: Diagnosis not present

## 2016-08-01 DIAGNOSIS — M7740 Metatarsalgia, unspecified foot: Secondary | ICD-10-CM

## 2016-08-01 DIAGNOSIS — M216X2 Other acquired deformities of left foot: Secondary | ICD-10-CM

## 2016-08-01 NOTE — Progress Notes (Signed)
SUBJECTIVE: 51 y.o. year old female presents stating that her feet hurt by the end of the day for the past 2 weeks. On feet daily 8 hours a day. Does some sitting in between. Pain is on top and side of lateral ankle. Feels shoes are too tight. Uses Biofreeze at night for pain. Wearing shoes also hurts. Also having toe nail problem with thickness on the great toe nails. While taking chemo nails turned black.  Diagnosed with Non Hodgkin's' Lymphoma in 2012 and took Chemotherapy.  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 Uses defibulator. For heart condition. Thyroid nodule was monitored.   REVIEW OF SYSTEMS: Pertinent items noted in HPI and remainder of comprehensive ROS otherwise negative.  OBJECTIVE: DERMATOLOGIC EXAMINATION: Nails: Severely dystrophic and enlarge great toe nails with fungal debris. Dry scaly skin on both feet.  VASCULAR EXAMINATION OF LOWER LIMBS: All pedal pulses are palpable with normal pulsation.  Capillary Filling times within 3 seconds in all digits.  No edema or erythema noted. Temperature gradient from tibial crest to dorsum of foot is within normal bilateral.  NEUROLOGIC EXAMINATION OF THE LOWER LIMBS: Achilles DTR is present and within normal. Monofilament (Semmes-Weinstein 10-gm) sensory testing positive 6 out of 6, bilateral. Vibratory sensations(128Hz  turning fork) intact at medial and lateral forefoot bilateral.  Sharp and Dull discriminatory sensations at the plantar ball of hallux is intact bilateral.   MUSCULOSKELETAL EXAMINATION: Positive for enlarged first metatarsal head bilateral. Dorsally elevated first ray with loading of forefoot bilateral. STJ excess pronation bilateral. Pain at lateral column and near lateral ankle with pressure.  RADIOGRAPHIC STUDIES:  AP View:  Adducted type foot with enlarged medial eminence of the first ray bilateral. Lateral view:  Dorsally elevated first ray bilateral. Positive of posterior calcaneal  spur bilateral.  ASSESSMENT: STJ hyperpronation bilateral. Elevated first ray bilateral. Lesser metatarsalgia bilateral. Tenosynovitis right lateral ankle. Onychomycosis with nail deformity both great toes.  PLAN: Reviewed findings and available treatment options, exercise, orthotics, skin care, periodic nail debridement. Both feet casted for orthotics. Both great toe nails debrided and grinded. Will call when orthotics are ready.

## 2016-08-01 NOTE — Patient Instructions (Signed)
Painful feet. Noted of weakelevated  first metatarsal bone with bunion and pronating Subtalar joint. Thick dystrophic nails on both feet. All nails debrided. Both feet casted for Orthotics. Reviewed medicated shampoo scrub for dry scaly skin.. Will call when orthotics are ready.

## 2016-08-17 ENCOUNTER — Ambulatory Visit (INDEPENDENT_AMBULATORY_CARE_PROVIDER_SITE_OTHER): Payer: BLUE CROSS/BLUE SHIELD

## 2016-08-17 ENCOUNTER — Telehealth: Payer: Self-pay | Admitting: Cardiology

## 2016-08-17 DIAGNOSIS — Z95810 Presence of automatic (implantable) cardiac defibrillator: Secondary | ICD-10-CM

## 2016-08-17 DIAGNOSIS — I5042 Chronic combined systolic (congestive) and diastolic (congestive) heart failure: Secondary | ICD-10-CM

## 2016-08-17 NOTE — Telephone Encounter (Signed)
LMOVM reminding pt to send remote transmission.   

## 2016-08-18 NOTE — Progress Notes (Signed)
EPIC Encounter for ICM Monitoring  Patient Name: Darlene Pierce is a 51 y.o. female Date: 08/18/2016 Primary Care Physican: No PCP Per Patient Primary Cardiologist: [place] Electrophysiologist: Allred Dry Weight:unknown Bi-V Pacing: 97%       Heart Failure questions reviewed, pt asymptomatic.   Thoracic impedance normal but was abnormal suggesting fluid accumulation 08/08/2016 to 08/15/2016.  Prescribed dosage: Torsemide 20 mg 1 tablet bid  Labs: 02/28/2016 Creatinine 0.85, BUN 14, Potassium 4.2, Sodium 138, EGFR >60  Recommendations: No changes. Discussed reading food labels to determine amount of daily salt intake.  Reminded to limit dietary salt intake to 2000 mg/day and fluid intake to < 2 liters/day. Encouraged to call for fluid symptoms.  Follow-up plan: ICM clinic phone appointment on 09/18/2016.  Copy of ICM check sent to device physician.   3 month ICM trend: 08/18/2016   1 Year ICM trend:      [name], RN 08/18/2016 9:01 AM

## 2016-09-18 ENCOUNTER — Ambulatory Visit (INDEPENDENT_AMBULATORY_CARE_PROVIDER_SITE_OTHER): Payer: BLUE CROSS/BLUE SHIELD | Admitting: *Deleted

## 2016-09-18 ENCOUNTER — Ambulatory Visit (INDEPENDENT_AMBULATORY_CARE_PROVIDER_SITE_OTHER): Payer: BLUE CROSS/BLUE SHIELD

## 2016-09-18 DIAGNOSIS — I5042 Chronic combined systolic (congestive) and diastolic (congestive) heart failure: Secondary | ICD-10-CM | POA: Diagnosis not present

## 2016-09-18 DIAGNOSIS — Z95810 Presence of automatic (implantable) cardiac defibrillator: Secondary | ICD-10-CM

## 2016-09-18 DIAGNOSIS — I428 Other cardiomyopathies: Secondary | ICD-10-CM | POA: Diagnosis not present

## 2016-09-18 NOTE — Progress Notes (Signed)
Remote ICD transmission.   

## 2016-09-18 NOTE — Progress Notes (Signed)
EPIC Encounter for ICM Monitoring  Patient Name: Darlene Pierce is a 51 y.o. female Date: 09/18/2016 Primary Care Physican: No PCP Per Patient Primary Cardiologist: [place] Electrophysiologist: Allred Dry Weight:unknown Bi-V Pacing: 97%      Heart Failure questions reviewed, pt asymptomatic.   Thoracic impedance abnormal suggesting fluid accumulation for past 2 days.  Prescribed dosage: Torsemide 20 mg 1 tablet bid  Labs: 02/28/2016 Creatinine 0.85, BUN 14, Potassium 4.2, Sodium 138, EGFR >60  Recommendations:  Advised to increase Torsemide 20 mg to 2 tablets in AM and 1 tablet in PM x 2 days and then return to prior dosage of 1 tablet twice a day.  Follow-up plan: ICM clinic phone appointment on 09/22/2016.    Copy of ICM check sent to primary cardiologist and device physician.   3 month ICM trend: 09/18/2016   1 Year ICM trend:      [name], RN 09/18/2016 7:54 AM

## 2016-09-19 ENCOUNTER — Encounter: Payer: Self-pay | Admitting: Cardiology

## 2016-09-19 LAB — CUP PACEART REMOTE DEVICE CHECK
Battery Remaining Longevity: 49 mo
Battery Remaining Percentage: 58 %
Battery Voltage: 2.95 V
Brady Statistic AP VP Percent: 3.5 %
Brady Statistic AS VP Percent: 93 %
Brady Statistic RA Percent Paced: 3.7 %
Date Time Interrogation Session: 20180430100018
HIGH POWER IMPEDANCE MEASURED VALUE: 66 Ohm
HighPow Impedance: 66 Ohm
Implantable Lead Implant Date: 20150715
Implantable Lead Implant Date: 20150715
Implantable Lead Location: 753858
Implantable Pulse Generator Implant Date: 20150715
Lead Channel Impedance Value: 410 Ohm
Lead Channel Impedance Value: 480 Ohm
Lead Channel Impedance Value: 590 Ohm
Lead Channel Pacing Threshold Amplitude: 1 V
Lead Channel Pacing Threshold Amplitude: 1 V
Lead Channel Pacing Threshold Pulse Width: 0.4 ms
Lead Channel Pacing Threshold Pulse Width: 0.4 ms
Lead Channel Pacing Threshold Pulse Width: 0.4 ms
Lead Channel Sensing Intrinsic Amplitude: 11.7 mV
Lead Channel Sensing Intrinsic Amplitude: 5 mV
Lead Channel Setting Pacing Amplitude: 2 V
Lead Channel Setting Pacing Amplitude: 2 V
Lead Channel Setting Pacing Amplitude: 2 V
Lead Channel Setting Pacing Pulse Width: 0.4 ms
Lead Channel Setting Sensing Sensitivity: 0.4 mV
MDC IDC LEAD IMPLANT DT: 20150715
MDC IDC LEAD LOCATION: 753859
MDC IDC LEAD LOCATION: 753860
MDC IDC MSMT LEADCHNL RA PACING THRESHOLD AMPLITUDE: 0.5 V
MDC IDC SET LEADCHNL RV PACING PULSEWIDTH: 0.4 ms
MDC IDC STAT BRADY AP VS PERCENT: 1 %
MDC IDC STAT BRADY AS VS PERCENT: 2.9 %
Pulse Gen Serial Number: 7198577

## 2016-09-22 ENCOUNTER — Ambulatory Visit (INDEPENDENT_AMBULATORY_CARE_PROVIDER_SITE_OTHER): Payer: Self-pay

## 2016-09-22 DIAGNOSIS — I5042 Chronic combined systolic (congestive) and diastolic (congestive) heart failure: Secondary | ICD-10-CM

## 2016-09-22 DIAGNOSIS — Z95810 Presence of automatic (implantable) cardiac defibrillator: Secondary | ICD-10-CM

## 2016-09-22 NOTE — Progress Notes (Signed)
EPIC Encounter for ICM Monitoring  Patient Name: Darlene Pierce is a 51 y.o. female Date: 09/22/2016 Primary Care Physican: No PCP Per Patient Primary Cardiologist: [place] Electrophysiologist: Allred Dry Weight:unknown Bi-V Pacing: 97%                                         Heart Failure questions reviewed, pt asymptomatic.   Thoracic impedance returned to normal after taking 2 days of increased Furosemide.  Prescribed dosage: Torsemide 20 mg 1 tablet bid  Labs: 02/28/2016 Creatinine 0.85, BUN 14, Potassium 4.2, Sodium 138, EGFR >60  Recommendations: No changes.  Encouraged to call for fluid symptoms or use local ER for any urgent symptoms.  Follow-up plan: ICM clinic phone appointment on 10/19/2016.    Copy of ICM check sent to device physician.   3 month ICM trend: 09/22/2016   1 Year ICM trend:      [name], RN 09/22/2016 11:24 AM

## 2016-09-27 ENCOUNTER — Ambulatory Visit: Payer: BLUE CROSS/BLUE SHIELD | Admitting: Podiatry

## 2016-10-04 ENCOUNTER — Ambulatory Visit: Payer: BLUE CROSS/BLUE SHIELD | Admitting: Podiatry

## 2016-10-09 ENCOUNTER — Other Ambulatory Visit (HOSPITAL_COMMUNITY): Payer: Self-pay | Admitting: Internal Medicine

## 2016-10-09 ENCOUNTER — Telehealth (HOSPITAL_COMMUNITY): Payer: Self-pay | Admitting: Pharmacist

## 2016-10-09 NOTE — Telephone Encounter (Signed)
Ms. Gafford has new insurance now through [employer] (ID: [id number], GRP: [id number], phone# [phone]) so needs new PA for [facility]. I have initiated the PA over the phone today but it will take 24-72 hours for a decision to be made. Relayed info to Ms. Preast and asked her to call us if she runs out before the decision is made so that we can provide her with samples in the meantime.   PA # [id number]  [name]. [name], PharmD, BCPS, CPP Clinical Pharmacist Pager: [phone] Phone: [phone] 10/09/2016 4:23 PM

## 2016-10-10 ENCOUNTER — Telehealth (HOSPITAL_COMMUNITY): Payer: Self-pay | Admitting: Pharmacist

## 2016-10-10 NOTE — Telephone Encounter (Signed)
Entresto 97-103 mg BID PA approved by [employer] through 10/09/17.   [name]. [name], PharmD, BCPS, CPP Clinical Pharmacist Pager: [phone] Phone: [phone] 10/10/2016 9:16 AM

## 2016-10-18 ENCOUNTER — Encounter (HOSPITAL_COMMUNITY): Payer: Self-pay | Admitting: *Deleted

## 2016-10-18 NOTE — Progress Notes (Signed)
Received fax today from [facility] requesting all medical records for patient.    I have faxed all medical records as requested to [phone].  Original request will be scanned to patient's electronic medical record.

## 2016-10-19 ENCOUNTER — Ambulatory Visit (INDEPENDENT_AMBULATORY_CARE_PROVIDER_SITE_OTHER): Payer: BLUE CROSS/BLUE SHIELD

## 2016-10-19 DIAGNOSIS — Z95810 Presence of automatic (implantable) cardiac defibrillator: Secondary | ICD-10-CM | POA: Diagnosis not present

## 2016-10-19 DIAGNOSIS — I5042 Chronic combined systolic (congestive) and diastolic (congestive) heart failure: Secondary | ICD-10-CM

## 2016-10-20 ENCOUNTER — Telehealth: Payer: Self-pay

## 2016-10-20 NOTE — Progress Notes (Signed)
Returned patient call as requested by voice mail and she asked to have detailed message left on phone.  Attempted call back and left message regarding transmission.  Advised to call if she is having fluid symptoms.  If she is feeling fine then will check fluid levels on 11/20/2016 but to call back if she has any questions.

## 2016-10-20 NOTE — Telephone Encounter (Signed)
Remote ICM transmission received.  Attempted patient call and mail box is full. 

## 2016-10-20 NOTE — Progress Notes (Signed)
EPIC Encounter for ICM Monitoring  Patient Name: Darlene Pierce is a 51 y.o. female Date: 10/20/2016 Primary Care Physican: Patient, No Pcp Per Primary Cardiologist: [place] Electrophysiologist: Allred Dry Weight:unknown Bi-V Pacing: 97%  Attempted call to patient and unable to reach. Transmission reviewed.   Thoracic impedance normal but was abnormal from 09/30/2016 to 10/10/2016 and 10/15/2016 to 10/16/2016 suggesting fluid accumulation  Prescribed dosage: Torsemide 20 mg 1 tablet twice a day  Labs: 02/28/2016 Creatinine 0.85, BUN 14, Potassium 4.2, Sodium 138, EGFR >60  Recommendations: NONE - Unable to reach patient   Follow-up plan: ICM clinic phone appointment on 11/20/2016.    Copy of ICM check sent to device physician.   3 month ICM trend: 10/19/2016   1 Year ICM trend:     [name], RN 10/20/2016 10:03 AM

## 2016-11-01 ENCOUNTER — Ambulatory Visit: Payer: BLUE CROSS/BLUE SHIELD | Admitting: Podiatry

## 2016-11-20 ENCOUNTER — Ambulatory Visit (INDEPENDENT_AMBULATORY_CARE_PROVIDER_SITE_OTHER): Payer: BLUE CROSS/BLUE SHIELD

## 2016-11-20 ENCOUNTER — Other Ambulatory Visit (HOSPITAL_BASED_OUTPATIENT_CLINIC_OR_DEPARTMENT_OTHER): Payer: Self-pay | Admitting: Physician Assistant

## 2016-11-20 ENCOUNTER — Telehealth: Payer: Self-pay | Admitting: Cardiology

## 2016-11-20 DIAGNOSIS — I5042 Chronic combined systolic (congestive) and diastolic (congestive) heart failure: Secondary | ICD-10-CM

## 2016-11-20 DIAGNOSIS — Z95810 Presence of automatic (implantable) cardiac defibrillator: Secondary | ICD-10-CM

## 2016-11-20 DIAGNOSIS — Z1231 Encounter for screening mammogram for malignant neoplasm of breast: Secondary | ICD-10-CM

## 2016-11-20 NOTE — Telephone Encounter (Signed)
Spoke with pt and reminded pt of remote transmission that is due today. Pt verbalized understanding.   

## 2016-11-21 NOTE — Progress Notes (Signed)
EPIC Encounter for ICM Monitoring  Patient Name: Darlene Pierce is a 51 y.o. female Date: 11/21/2016 Primary Care Physican: Patient, No Pcp Per Primary Cardiologist: [place] Electrophysiologist: Allred Dry Weight:212 lbs Bi-V Pacing: 97%      Heart Failure questions reviewed, pt asymptomatic.   Thoracic impedance normal but was abnormal from 10/28/2016 to 11/06/2016 suggesting fluid accumulation.   Prescribed dosage: Torsemide 20 mg 1 tablet twice a day  Labs: 02/28/2016 Creatinine 0.85, BUN 14, Potassium 4.2, Sodium 138, EGFR >60  Recommendations: No changes.  Advised to limit salt intake to 2000 mg/day and fluid intake to < 2 liters/day.  Encouraged to call for fluid symptoms.  Follow-up plan: ICM clinic phone appointment on 12/21/2016.  Office appointment scheduled 01/17/2017 with Dr Haroldine Laws.  Copy of ICM check sent to device physician.   3 month ICM trend: 11/21/2016   1 Year ICM trend:      [name], RN 11/21/2016 8:20 AM

## 2016-12-04 ENCOUNTER — Telehealth: Payer: Self-pay | Admitting: Cardiology

## 2016-12-04 NOTE — Telephone Encounter (Signed)
Spoke w/ pt and requested that she send a manual transmission b/c her home monitor has not updated in at least 7 days.   

## 2016-12-05 ENCOUNTER — Telehealth: Payer: Self-pay | Admitting: Internal Medicine

## 2016-12-05 NOTE — Telephone Encounter (Signed)
New Message  Pt call requesting to speak with RN. Pt states she called company about her device. Pt states she was told she used up the data and a ticket had to be put in for her device. Please call back to discuss

## 2016-12-07 NOTE — Telephone Encounter (Signed)
Spoke with Ms. Darlene Pierce and she stated that she had called [employer] and they stated that her wireless adapter was not working and had sent one out to her, informed Ms. Darlene Pierce if she doesn't received the cell adapter within 2 weeks to call the office and we could give one to her. Ms. Darlene Pierce voiced understanding.

## 2016-12-15 ENCOUNTER — Ambulatory Visit (HOSPITAL_BASED_OUTPATIENT_CLINIC_OR_DEPARTMENT_OTHER)
Admission: RE | Admit: 2016-12-15 | Discharge: 2016-12-15 | Disposition: A | Discharge status: Home / Self Care | Payer: 59 | Source: Ambulatory Visit | Attending: Physician Assistant | Admitting: Physician Assistant

## 2016-12-15 DIAGNOSIS — Z1231 Encounter for screening mammogram for malignant neoplasm of breast: Secondary | ICD-10-CM | POA: Diagnosis not present

## 2016-12-18 DIAGNOSIS — K219 Gastro-esophageal reflux disease without esophagitis: Secondary | ICD-10-CM | POA: Insufficient documentation

## 2016-12-19 ENCOUNTER — Encounter (HOSPITAL_COMMUNITY): Payer: Self-pay | Admitting: Pharmacist

## 2016-12-20 NOTE — Telephone Encounter (Signed)
This encounter was created in error - please disregard.

## 2016-12-21 ENCOUNTER — Encounter: Payer: 59 | Admitting: *Deleted

## 2016-12-21 ENCOUNTER — Telehealth: Payer: Self-pay | Admitting: Cardiology

## 2016-12-21 NOTE — Telephone Encounter (Signed)
Spoke with pt and reminded pt of remote transmission that is due today. Pt verbalized understanding.   

## 2016-12-22 ENCOUNTER — Encounter: Payer: Self-pay | Admitting: Cardiology

## 2016-12-27 ENCOUNTER — Ambulatory Visit (INDEPENDENT_AMBULATORY_CARE_PROVIDER_SITE_OTHER): Payer: 59 | Admitting: *Deleted

## 2016-12-27 DIAGNOSIS — Z95810 Presence of automatic (implantable) cardiac defibrillator: Secondary | ICD-10-CM | POA: Diagnosis not present

## 2016-12-27 DIAGNOSIS — I428 Other cardiomyopathies: Secondary | ICD-10-CM | POA: Diagnosis not present

## 2016-12-27 DIAGNOSIS — I5042 Chronic combined systolic (congestive) and diastolic (congestive) heart failure: Secondary | ICD-10-CM | POA: Diagnosis not present

## 2016-12-27 NOTE — Progress Notes (Signed)
Remote ICD transmission.   

## 2016-12-28 ENCOUNTER — Telehealth: Payer: Self-pay

## 2016-12-28 ENCOUNTER — Encounter: Payer: Self-pay | Admitting: Cardiology

## 2016-12-28 NOTE — Telephone Encounter (Signed)
Remote ICM transmission received.  Attempted patient call and left detailed message regarding transmission and next ICM scheduled for 01/29/2017.  Advised to return call for any fluid symptoms or questions.

## 2016-12-28 NOTE — Progress Notes (Signed)
EPIC Encounter for ICM Monitoring  Patient Name: Darlene Pierce is a 51 y.o. female Date: 12/28/2016 Primary Care Physican: Vicenta Aly, [place] Primary Cardiologist: [place] Electrophysiologist: Allred Dry Weight:212 lbs Bi-V Pacing: 97%       Attempted call to patient and unable to reach.  Left detailed message regarding transmission.  Transmission reviewed.    Thoracic impedance is normal but was abnormal suggesting fluid accumulation from 7/4 to 7/14, 7/19 to 7/26 and 8/4 to 8/7.  Prescribed dosage: Torsemide 20 mg 1 tablet twice a day  Labs: 02/28/2016 Creatinine 0.85, BUN 14, Potassium 4.2, Sodium 138, EGFR >60  Recommendations: Left voice mail with ICM number and encouraged to call for fluid symptoms.  Follow-up plan: ICM clinic phone appointment on 12/28/2016.  Office appointment scheduled 01/17/2017 with Dr Haroldine Laws.  Copy of ICM check sent to device physician.   3 month ICM trend: 12/27/2016   1 Year ICM trend:      [name], RN 12/28/2016 1:51 PM

## 2017-01-02 LAB — CUP PACEART REMOTE DEVICE CHECK
Battery Remaining Longevity: 47 mo
Battery Remaining Percentage: 56 %
Battery Voltage: 2.93 V
Brady Statistic AP VP Percent: 3.4 %
Brady Statistic AP VS Percent: 1 %
Brady Statistic AS VP Percent: 94 %
Brady Statistic AS VS Percent: 2.5 %
Brady Statistic RA Percent Paced: 3.6 %
Date Time Interrogation Session: 20180808101900
HIGH POWER IMPEDANCE MEASURED VALUE: 69 Ohm
HighPow Impedance: 69 Ohm
Implantable Lead Implant Date: 20150715
Implantable Lead Implant Date: 20150715
Implantable Lead Location: 753860
Lead Channel Impedance Value: 390 Ohm
Lead Channel Pacing Threshold Amplitude: 1 V
Lead Channel Pacing Threshold Amplitude: 1 V
Lead Channel Pacing Threshold Pulse Width: 0.4 ms
Lead Channel Pacing Threshold Pulse Width: 0.4 ms
Lead Channel Sensing Intrinsic Amplitude: 4.2 mV
Lead Channel Setting Pacing Amplitude: 2 V
Lead Channel Setting Pacing Amplitude: 2 V
Lead Channel Setting Pacing Pulse Width: 0.4 ms
Lead Channel Setting Sensing Sensitivity: 0.4 mV
MDC IDC LEAD IMPLANT DT: 20150715
MDC IDC LEAD LOCATION: 753858
MDC IDC LEAD LOCATION: 753859
MDC IDC MSMT LEADCHNL LV IMPEDANCE VALUE: 610 Ohm
MDC IDC MSMT LEADCHNL RA IMPEDANCE VALUE: 430 Ohm
MDC IDC MSMT LEADCHNL RA PACING THRESHOLD AMPLITUDE: 0.5 V
MDC IDC MSMT LEADCHNL RA PACING THRESHOLD PULSEWIDTH: 0.4 ms
MDC IDC MSMT LEADCHNL RV SENSING INTR AMPL: 11.7 mV
MDC IDC PG IMPLANT DT: 20150715
MDC IDC PG SERIAL: 7198577
MDC IDC SET LEADCHNL LV PACING AMPLITUDE: 2 V
MDC IDC SET LEADCHNL RV PACING PULSEWIDTH: 0.4 ms

## 2017-01-17 ENCOUNTER — Encounter (HOSPITAL_COMMUNITY): Payer: BLUE CROSS/BLUE SHIELD | Admitting: Internal Medicine

## 2017-01-19 ENCOUNTER — Telehealth: Payer: Self-pay | Admitting: Cardiology

## 2017-01-19 NOTE — Telephone Encounter (Signed)
Spoke w/ pt and requested that she send a manual transmission b/c her home monitor has not updated in at least 7 days.   

## 2017-01-29 ENCOUNTER — Telehealth: Payer: Self-pay | Admitting: Cardiology

## 2017-01-29 NOTE — Telephone Encounter (Signed)
Spoke with pt and reminded pt of remote transmission that is due today. Pt verbalized understanding.   

## 2017-01-30 NOTE — Progress Notes (Signed)
No ICM remote transmission received for 01/29/2017 and next ICM transmission scheduled for 02/08/2017.

## 2017-02-05 ENCOUNTER — Telehealth: Payer: Self-pay | Admitting: Cardiology

## 2017-02-05 NOTE — Telephone Encounter (Signed)
Spoke w/ pt and requested that she send a manual transmission b/c her home monitor has not updated in at least 7 days.   

## 2017-02-08 ENCOUNTER — Telehealth: Payer: Self-pay

## 2017-02-08 ENCOUNTER — Ambulatory Visit (INDEPENDENT_AMBULATORY_CARE_PROVIDER_SITE_OTHER): Payer: 59

## 2017-02-08 DIAGNOSIS — I5042 Chronic combined systolic (congestive) and diastolic (congestive) heart failure: Secondary | ICD-10-CM | POA: Diagnosis not present

## 2017-02-08 DIAGNOSIS — Z95810 Presence of automatic (implantable) cardiac defibrillator: Secondary | ICD-10-CM

## 2017-02-08 NOTE — Telephone Encounter (Signed)
LMOVM requesting that pt send manual transmission 

## 2017-02-09 NOTE — Progress Notes (Signed)
EPIC Encounter for ICM Monitoring  Patient Name: Darlene Pierce is a 51 y.o. female Date: 02/09/2017 Primary Care Physican: Vicenta Aly, [place] Primary Cardiologist: [place] Electrophysiologist: Allred Dry Weight:219 lbs Bi-V Pacing: 97%       Heart Failure questions reviewed, pt symptomatic with 5-6 lb weight gain in the last 10 days but she did not recognize as fluid weight.  Base weight is usually 210 lbs to 215 lbs.    Thoracic impedance abnormal suggesting fluid accumulation since 02/01/2017.  Prescribed dosage: Torsemide 20 mg 1 tablet twice a day  Labs: 02/28/2016 Creatinine 0.85, BUN 14, Potassium 4.2, Sodium 138, EGFR >60  Recommendations: Advised to increase Torsemide to 40 mg twice a day x 3 days and then return to prior dosage of 20 mg twice a day.   Follow-up plan: ICM clinic phone appointment on 02/12/2017 (manual send).  Due to make an appointment with Chanetta Marshall, NP for October.  Canceled last appt on 01/17/2017 with Dr Haroldine Laws.   Copy of ICM check sent to Dr. Haroldine Laws and Dr. Rayann Heman for review.   3 month ICM trend: 02/08/2017   1 Year ICM trend:      [name], RN 02/09/2017 8:00 AM

## 2017-02-12 ENCOUNTER — Telehealth: Payer: Self-pay | Admitting: Cardiology

## 2017-02-12 ENCOUNTER — Ambulatory Visit (INDEPENDENT_AMBULATORY_CARE_PROVIDER_SITE_OTHER): Payer: 59

## 2017-02-12 DIAGNOSIS — Z95810 Presence of automatic (implantable) cardiac defibrillator: Secondary | ICD-10-CM

## 2017-02-12 DIAGNOSIS — I5042 Chronic combined systolic (congestive) and diastolic (congestive) heart failure: Secondary | ICD-10-CM

## 2017-02-12 NOTE — Telephone Encounter (Signed)
Spoke with pt and reminded pt of remote transmission that is due today. Pt verbalized understanding.   

## 2017-02-12 NOTE — Progress Notes (Signed)
EPIC Encounter for ICM Monitoring  Patient Name: Darlene Pierce is a 51 y.o. female Date: 02/12/2017 Primary Care Physican: Vicenta Aly, [place] Primary Cardiologist: [place] Electrophysiologist: Allred Dry Weight:209 lbs Bi-V Pacing: 97%        Heart Failure questions reviewed, pt lost 9 pounds after taking extra Furosemide   Thoracic impedance returned to normal after 3 days of increased Furosemide.  Prescribed dosage: Torsemide 20 mg 1 tablet twice a day  Labs: 02/28/2016 Creatinine 0.85, BUN 14, Potassium 4.2, Sodium 138, EGFR >60  Recommendations: No changes.   Encouraged to call for fluid symptoms.  Follow-up plan: ICM clinic phone appointment on 03/13/2017.  Due to make an appointment with Chanetta Marshall, NP for October.   Copy of ICM check sent to Dr. Rayann Heman.   3 month ICM trend: 02/12/2017   1 Year ICM trend:      [name], RN 02/12/2017 12:36 PM

## 2017-02-19 ENCOUNTER — Other Ambulatory Visit (HOSPITAL_COMMUNITY): Payer: Self-pay | Admitting: Internal Medicine

## 2017-02-27 ENCOUNTER — Telehealth: Payer: Self-pay

## 2017-02-27 MED ORDER — TORSEMIDE 20 MG PO TABS: 20.0000 mg | ORAL_TABLET | Freq: Two times a day (BID) | ORAL | 3 refills | Status: DC

## 2017-02-27 NOTE — Telephone Encounter (Signed)
Refill sent in, left VM for pt this was done

## 2017-02-27 NOTE — Telephone Encounter (Signed)
Returned patient call as requested by voice mail regarding Torsemide refill.  She has been out of the medication for 4 days and thinks she has some fluid accumulation.  She has an appointment with Dr Haroldine Laws on 03/06/2017.  The pharmacy she uses is [facility] [phone].  [address] at [facility] and Animal nutritionist. Advised I would send message to HF clinic for prescription to be refilled.

## 2017-02-28 ENCOUNTER — Telehealth: Payer: Self-pay | Admitting: Cardiology

## 2017-02-28 NOTE — Telephone Encounter (Signed)
Spoke w/ pt and requested that she send a manual transmission b/c her home monitor has not updated in at least 7 days.   

## 2017-03-06 ENCOUNTER — Ambulatory Visit (HOSPITAL_COMMUNITY)
Admission: RE | Admit: 2017-03-06 | Discharge: 2017-03-06 | Disposition: A | Discharge status: Home / Self Care | Payer: 59 | Source: Ambulatory Visit | Attending: Internal Medicine | Admitting: Internal Medicine

## 2017-03-06 ENCOUNTER — Encounter (HOSPITAL_COMMUNITY): Payer: Self-pay

## 2017-03-06 VITALS — BP 126/90 | HR 86 | Wt 208.8 lb

## 2017-03-06 DIAGNOSIS — I10 Essential (primary) hypertension: Secondary | ICD-10-CM

## 2017-03-06 DIAGNOSIS — Z95810 Presence of automatic (implantable) cardiac defibrillator: Secondary | ICD-10-CM | POA: Diagnosis not present

## 2017-03-06 DIAGNOSIS — Z8572 Personal history of non-Hodgkin lymphomas: Secondary | ICD-10-CM | POA: Insufficient documentation

## 2017-03-06 DIAGNOSIS — Z9221 Personal history of antineoplastic chemotherapy: Secondary | ICD-10-CM | POA: Diagnosis not present

## 2017-03-06 DIAGNOSIS — E669 Obesity, unspecified: Secondary | ICD-10-CM | POA: Diagnosis not present

## 2017-03-06 DIAGNOSIS — Z9884 Bariatric surgery status: Secondary | ICD-10-CM | POA: Diagnosis not present

## 2017-03-06 DIAGNOSIS — I5042 Chronic combined systolic (congestive) and diastolic (congestive) heart failure: Secondary | ICD-10-CM

## 2017-03-06 DIAGNOSIS — Z6833 Body mass index (BMI) 33.0-33.9, adult: Secondary | ICD-10-CM | POA: Insufficient documentation

## 2017-03-06 DIAGNOSIS — I110 Hypertensive heart disease with heart failure: Secondary | ICD-10-CM | POA: Insufficient documentation

## 2017-03-06 DIAGNOSIS — Z923 Personal history of irradiation: Secondary | ICD-10-CM | POA: Insufficient documentation

## 2017-03-06 DIAGNOSIS — Z8249 Family history of ischemic heart disease and other diseases of the circulatory system: Secondary | ICD-10-CM | POA: Insufficient documentation

## 2017-03-06 DIAGNOSIS — Z79899 Other long term (current) drug therapy: Secondary | ICD-10-CM | POA: Diagnosis not present

## 2017-03-06 DIAGNOSIS — G4733 Obstructive sleep apnea (adult) (pediatric): Secondary | ICD-10-CM | POA: Diagnosis not present

## 2017-03-06 DIAGNOSIS — I5022 Chronic systolic (congestive) heart failure: Secondary | ICD-10-CM | POA: Insufficient documentation

## 2017-03-06 DIAGNOSIS — I428 Other cardiomyopathies: Secondary | ICD-10-CM

## 2017-03-06 LAB — BASIC METABOLIC PANEL
Anion gap: 7 (ref 5–15)
BUN: 15 mg/dL (ref 6–20)
CALCIUM: 9.4 mg/dL (ref 8.9–10.3)
CO2: 29 mmol/L (ref 22–32)
CREATININE: 1 mg/dL (ref 0.44–1.00)
Chloride: 101 mmol/L (ref 101–111)
GFR calc Af Amer: >60 mL/min (ref >60)
GLUCOSE: 99 mg/dL (ref 65–99)
Potassium: 3.4 mmol/L — ABNORMAL LOW (ref 3.5–5.1)
SODIUM: 137 mmol/L (ref 135–145)

## 2017-03-06 LAB — CBC
HCT: 39 % (ref 36.0–46.0)
Hemoglobin: 12.4 g/dL (ref 12.0–15.0)
MCH: 28.3 pg (ref 26.0–34.0)
MCHC: 31.8 g/dL (ref 30.0–36.0)
MCV: 89 fL (ref 78.0–100.0)
PLATELETS: 285 10*3/uL (ref 150–400)
RBC: 4.38 MIL/uL (ref 3.87–5.11)
RDW: 13.4 % (ref 11.5–15.5)
WBC: 5 10*3/uL (ref 4.0–10.5)

## 2017-03-06 MED ORDER — SPIRONOLACTONE 25 MG PO TABS: 12.5000 mg | ORAL_TABLET | Freq: Every day | ORAL | 3 refills | Status: DC

## 2017-03-06 NOTE — Progress Notes (Signed)
Advanced Heart Failure Clinic Note   Referring Physician: Self referral ( Previous patient) Primary Cardiologist: Bensimhon  HPI:  Darlene Pierce is a 51 y.o. female with history of NICM, HTN, non-Hodgkin's Lymphoma (radiation in 2000, chemotherapy in 2012), OSA and chronic systolic CHF. She was treated with doxorubicin, vincistine, cyclophospamide and rituxamab.   LHC 12/08/11 with normal coronary arteries.  Echo 05/26/13: Mild LVH, EF 45-50%, Gr 1 DD, mild LAE, RVSP 27.  Echo 09/18/13: LVEF 25-30%, Grade 1 DD Echo 02/2016: LVEF 35-40%.   Admitted [facility] 08/2013 with ADHF. EF dropped from 45 -> 25 as above. Diuresed with IV lasix. Required short term dobutamine for hypotension and AKI.   Pt moved to [place] shortly after hospitalization 09/2013 was followed at [facility]. CRT-D ([employer]). Says last EF was in 40-45%.  Underwent gastric sleeve on 03/24/15. Lost 60 pounds.   Returns today for follow up. Feeling well, working out at [employer] 30 minutes a day. Has occasional SOB, but overall does well. Denies chest pain, palpitations, orthopnea and PND. Taking her medications, except has not taken Arlyce Harman or dig. Watching her diet, drinking less than 2L a day.    Review of Systems: [y] = yes, [ ]  = no   General: Weight gain [ ] ; Weight loss [ ] ; Anorexia [ ] ; Fatigue [ ] ; Fever [ ] ; Chills [ ] ; Weakness [ ]   Cardiac: Chest pain/pressure [ ] ; Resting SOB [ ] ; Exertional SOB [ y]; Orthopnea [ ] ; Pedal Edema [ ] ; Palpitations [ ] ; Syncope [ ] ; Presyncope [ ] ; Paroxysmal nocturnal dyspnea[ ]   Pulmonary: Cough [ ] ; Wheezing[ ] ; Hemoptysis[ ] ; Sputum [ ] ; Snoring [ ]   GI: Vomiting[ ] ; Dysphagia[ ] ; Melena[ ] ; Hematochezia [ ] ; Heartburn[ ] ; Abdominal pain [ ] ; Constipation [ ] ; Diarrhea [ ] ; BRBPR [ ]   GU: Hematuria[ ] ; Dysuria [ ] ; Nocturia[ ]   Vascular: Pain in legs with walking [ ] ; Pain in feet with lying flat [ ] ; Non-healing sores [ ] ; Stroke [ ] ; TIA [ ] ; Slurred speech [ ] ;  Neuro:  Headaches[ ] ; Vertigo[ ] ; Seizures[ ] ; Paresthesias[ ] ;Blurred vision [ ] ; Diplopia [ ] ; Vision changes [ ]   Ortho/Skin: Arthritis [ ] ; Joint pain [ ] ; Muscle pain [ ] ; Joint swelling [ ] ; Back Pain [ ] ; Rash [ ]   Psych: Depression[ ] ; Anxiety[ ]   Heme: Bleeding problems [ ] ; Clotting disorders [ ] ; Anemia [ ]   Endocrine: Diabetes [ ] ; Thyroid dysfunction[ ]    Past Medical History:  Diagnosis Date  . CHF (congestive heart failure) ([place])   . H/O angiography    unsure of any PCI  . Hypertension   . Lymphoma ([place])    s/p chemotherapy 2012, radiation in 2000  . Multiple thyroid nodules   . Non-ischemic cardiomyopathy ([place])   . OSA (obstructive sleep apnea)    a. mild by sleep study 08/2013 - trial of weight loss;  refer to sleep med if unsucessful  . Respiratory failure ([place])    [facility] and was [facility]ator    Current Outpatient Prescriptions  Medication Sig Dispense Refill  . albuterol (PROVENTIL HFA;VENTOLIN HFA) 108 (90 BASE) MCG/ACT inhaler Inhale 2 puffs into the lungs every 6 (six) hours as needed for wheezing or shortness of breath. 1 Inhaler 2  . carvedilol (COREG) 25 MG tablet Take 1 tablet (25 mg total) by mouth 2 (two) times daily with a meal. 60 tablet 3  . Cholecalciferol (VITAMIN D) 2000  UNITS tablet Take 2,000 Units by mouth daily.    Marland Kitchen gabapentin (NEURONTIN) 100 MG capsule Take 100 mg by mouth daily.    . Iron-Folic Acid-Vit O27 (IRON FORMULA) 27-400-100 MG-MCG-MCG CAPS Take 1 tablet by mouth daily.    . sacubitril-valsartan (ENTRESTO) 97-103 MG Take 1 tablet by mouth 2 (two) times daily. 60 tablet 5  . torsemide (DEMADEX) 20 MG tablet Take 1 tablet (20 mg total) by mouth 2 (two) times daily. 60 tablet 3   No current facility-administered medications for this encounter.     No Known Allergies    Social History   Social History  . Marital status: Single    Spouse name: N/A  . Number of children: 4  . Years of education: N/A    Occupational History  .  [employer]   Social History Main Topics  . Smoking status: Never Smoker  . Smokeless tobacco: Never Used  . Alcohol use 0.6 oz/week    1 Glasses of wine per week     Comment: Occasional  . Drug use: No  . Sexual activity: Not on file   Other Topics Concern  . Not on file   Social History Narrative  . No narrative on file      Family History  Problem Relation Age of Onset  . CAD Mother     Vitals:   03/06/17 0856  BP: 126/90  Pulse: 86  SpO2: 98%  Weight: 208 lb 12.8 oz (94.7 kg)    PHYSICAL EXAM: General: Well appearing. No resp difficulty. HEENT: Normal Neck: Supple. JVP 5-6. Carotids 2+ bilat; no bruits. No thyromegaly or nodule noted. Cor: PMI nondisplaced. RRR, No M/G/R noted Lungs: CTAB, normal effort. Abdomen: Soft, non-tender, non-distended, no HSM. No bruits or masses. +BS  Extremities: No cyanosis, clubbing, rash, R and LLE no edema.  Neuro: Alert & orientedx3, cranial nerves grossly intact. moves all 4 extremities w/o difficulty. Affect pleasant      ASSESSMENT & PLAN:  1. Chronic systolic HF: NICM, likely chemo induced (Doxorubicin) or HTN. EF 35-40% 02/2016 - NYHA I - Volume stable on exam. Continue torsemide 20 mg BID. - Continue Entresto 97/103 mg BID.  - Continue Coreg 25 mg BID.  - Add Spiro 12.5 mg hs.  - BMET today and again in 10-14 days.  - Will need repeat Echo in 6 months.   2. HTN - Well controlled on current regimen.   3. Non-Hodgkins Lymphoma - Stable.   4. Obesity  - Lost 65 pounds s/p gastric sleeve - Continue exercise and limit portions.  - Body mass index is 33.2 kg/m.   She is doing well overall. Continues to exercise daily. Will follow up with Echo in 6 months.   Arbutus Leas 9:59 AM

## 2017-03-06 NOTE — Patient Instructions (Signed)
Start Spironolactone 12.5 mg (1/2 tab) every night   Labs drawn today (if we do not call you, then your lab work was stable)   Your physician recommends that you return for lab work in: 10-14 days  Your physician has requested that you have an echocardiogram. Echocardiography is a painless test that uses sound waves to create images of your heart. It provides your doctor with information about the size and shape of your heart and how well your heart's chambers and valves are working. This procedure takes approximately one hour. There are no restrictions for this procedure.  Your physician recommends that you schedule a follow-up appointment in: 6 months

## 2017-03-09 ENCOUNTER — Telehealth: Payer: Self-pay | Admitting: Cardiology

## 2017-03-09 NOTE — Telephone Encounter (Signed)
Spoke w/ pt and requested that she send a manual transmission b/c her home monitor has not updated in at least 7 days.   

## 2017-03-13 ENCOUNTER — Ambulatory Visit (INDEPENDENT_AMBULATORY_CARE_PROVIDER_SITE_OTHER): Payer: 59

## 2017-03-13 ENCOUNTER — Telehealth: Payer: Self-pay

## 2017-03-13 DIAGNOSIS — I5042 Chronic combined systolic (congestive) and diastolic (congestive) heart failure: Secondary | ICD-10-CM | POA: Diagnosis not present

## 2017-03-13 DIAGNOSIS — Z95810 Presence of automatic (implantable) cardiac defibrillator: Secondary | ICD-10-CM | POA: Diagnosis not present

## 2017-03-13 NOTE — Telephone Encounter (Signed)
Remote ICM transmission received.  Attempted call to patient and left detailed message per DPR regarding transmission and next ICM scheduled for 04/16/2017.  Advised to return call for any fluid symptoms or questions.    

## 2017-03-13 NOTE — Telephone Encounter (Signed)
Spoke with pt and reminded pt of remote transmission that is due today. Pt verbalized understanding.   

## 2017-03-13 NOTE — Progress Notes (Signed)
EPIC Encounter for ICM Monitoring  Patient Name: Darlene Pierce is a 51 y.o. female Date: 03/13/2017 Primary Care Physican: Vicenta Aly, [place] Primary Cardiologist: [place] Electrophysiologist: Allred Dry Weight:last weight 209 lbs Bi-V Pacing: 97%      Attempted call to patient and unable to reach.  Left detailed message regarding transmission.  Transmission reviewed.    Thoracic impedance normal but is above baseline suggesting dryness since 03/01/2017.  Prescribed dosage: Torsemide 20 mg 1 tablet twice a day  Labs: 02/28/2016 Creatinine 0.85, BUN 14, Potassium 4.2, Sodium 138, EGFR >60  Recommendations: Left voice mail with ICM number and encouraged to call if experiencing any fluid symptoms.  Follow-up plan: ICM clinic phone appointment on 04/16/2017.  Due to make an appointment with Chanetta Marshall, NP for October.   Copy of ICM check sent to Dr. Rayann Heman.  3 month ICM trend: 03/13/2017   1 Year ICM trend:      [name], RN 03/13/2017 12:16 PM

## 2017-03-16 ENCOUNTER — Ambulatory Visit (HOSPITAL_COMMUNITY)
Admission: RE | Admit: 2017-03-16 | Discharge: 2017-03-16 | Disposition: A | Discharge status: Home / Self Care | Payer: 59 | Source: Ambulatory Visit | Attending: Internal Medicine | Admitting: Internal Medicine

## 2017-03-16 ENCOUNTER — Telehealth (HOSPITAL_COMMUNITY): Payer: Self-pay | Admitting: Vascular Surgery

## 2017-03-16 ENCOUNTER — Telehealth (HOSPITAL_COMMUNITY): Payer: Self-pay | Admitting: Cardiology

## 2017-03-16 DIAGNOSIS — I5042 Chronic combined systolic (congestive) and diastolic (congestive) heart failure: Secondary | ICD-10-CM

## 2017-03-16 DIAGNOSIS — I509 Heart failure, unspecified: Secondary | ICD-10-CM

## 2017-03-16 LAB — BASIC METABOLIC PANEL
ANION GAP: 9 (ref 5–15)
BUN: 14 mg/dL (ref 6–20)
CALCIUM: 8.9 mg/dL (ref 8.9–10.3)
CO2: 26 mmol/L (ref 22–32)
Chloride: 104 mmol/L (ref 101–111)
Creatinine, Ser: 0.91 mg/dL (ref 0.44–1.00)
Glucose, Bld: 153 mg/dL — ABNORMAL HIGH (ref 65–99)
POTASSIUM: 3 mmol/L — AB (ref 3.5–5.1)
SODIUM: 139 mmol/L (ref 135–145)

## 2017-03-16 MED ORDER — SPIRONOLACTONE 25 MG PO TABS: 12.5000 mg | ORAL_TABLET | Freq: Every day | ORAL | 3 refills | Status: DC

## 2017-03-16 MED ORDER — CARVEDILOL 25 MG PO TABS: 25.0000 mg | ORAL_TABLET | Freq: Two times a day (BID) | ORAL | 3 refills | Status: DC

## 2017-03-16 MED ORDER — POTASSIUM CHLORIDE CRYS ER 20 MEQ PO TBCR: 20.0000 meq | EXTENDED_RELEASE_TABLET | Freq: Two times a day (BID) | ORAL | 3 refills | Status: DC

## 2017-03-16 NOTE — Telephone Encounter (Signed)
-----   Message from Arbutus Leas, NP sent at 03/16/2017 11:53 AM EDT ----- Please have her take 51mEq BID of KCl today. Repeat BMET on Monday.

## 2017-03-16 NOTE — Addendum Note (Signed)
Addended by: , Sharlot Gowda on: 03/16/2017 09:34 AM   Modules accepted: Orders

## 2017-03-16 NOTE — Telephone Encounter (Signed)
Pt walked in to get refill of carvedilol

## 2017-03-16 NOTE — Telephone Encounter (Signed)
Patient aware, patient reports she has been out of potassium for over a week, reported this to Mikey Kirschner Per VO patient should now restart KcL 20 meQ BID, return Monday for BMET

## 2017-03-16 NOTE — Telephone Encounter (Signed)
Opened in error

## 2017-03-29 ENCOUNTER — Telehealth: Payer: Self-pay | Admitting: Cardiology

## 2017-03-29 NOTE — Telephone Encounter (Signed)
Attempted to call pt b/c her home monitor has not updated in at least 7 days. Automated message stated that due to network difficulty that call could not be completed.

## 2017-04-05 ENCOUNTER — Telehealth: Payer: Self-pay | Admitting: Cardiology

## 2017-04-05 NOTE — Telephone Encounter (Signed)
LMOVM requesting that pt send manual transmission b/c home monitor has not updated in at least 7 days.    

## 2017-04-16 ENCOUNTER — Encounter: Payer: Self-pay | Admitting: Internal Medicine

## 2017-04-16 ENCOUNTER — Encounter: Payer: 59 | Admitting: *Deleted

## 2017-04-16 ENCOUNTER — Telehealth: Payer: Self-pay | Admitting: Cardiology

## 2017-04-16 ENCOUNTER — Ambulatory Visit (INDEPENDENT_AMBULATORY_CARE_PROVIDER_SITE_OTHER): Payer: 59 | Admitting: Internal Medicine

## 2017-04-16 VITALS — BP 118/90 | HR 66 | Ht 66.5 in | Wt 222.2 lb

## 2017-04-16 DIAGNOSIS — I428 Other cardiomyopathies: Secondary | ICD-10-CM | POA: Diagnosis not present

## 2017-04-16 DIAGNOSIS — I5042 Chronic combined systolic (congestive) and diastolic (congestive) heart failure: Secondary | ICD-10-CM

## 2017-04-16 DIAGNOSIS — I10 Essential (primary) hypertension: Secondary | ICD-10-CM

## 2017-04-16 LAB — CUP PACEART INCLINIC DEVICE CHECK
Brady Statistic RA Percent Paced: 4.2 %
Date Time Interrogation Session: 20181126101842
HighPow Impedance: 65 Ohm
Implantable Lead Implant Date: 20150715
Implantable Lead Implant Date: 20150715
Implantable Lead Location: 753859
Implantable Lead Location: 753860
Lead Channel Impedance Value: 430 Ohm
Lead Channel Impedance Value: 580 Ohm
Lead Channel Pacing Threshold Amplitude: 0.5 V
Lead Channel Pacing Threshold Pulse Width: 0.4 ms
Lead Channel Sensing Intrinsic Amplitude: 5 mV
Lead Channel Setting Pacing Amplitude: 2 V
Lead Channel Setting Pacing Pulse Width: 0.4 ms
MDC IDC LEAD IMPLANT DT: 20150715
MDC IDC LEAD LOCATION: 753858
MDC IDC MSMT LEADCHNL LV PACING THRESHOLD AMPLITUDE: 1 V
MDC IDC MSMT LEADCHNL RA PACING THRESHOLD PULSEWIDTH: 0.4 ms
MDC IDC MSMT LEADCHNL RV IMPEDANCE VALUE: 400 Ohm
MDC IDC MSMT LEADCHNL RV PACING THRESHOLD AMPLITUDE: 0.75 V
MDC IDC MSMT LEADCHNL RV PACING THRESHOLD PULSEWIDTH: 0.4 ms
MDC IDC MSMT LEADCHNL RV SENSING INTR AMPL: 12 mV
MDC IDC PG IMPLANT DT: 20150715
MDC IDC PG SERIAL: 7198577
MDC IDC SET LEADCHNL LV PACING AMPLITUDE: 2 V
MDC IDC SET LEADCHNL LV PACING PULSEWIDTH: 0.4 ms
MDC IDC SET LEADCHNL RV PACING AMPLITUDE: 2 V
MDC IDC SET LEADCHNL RV SENSING SENSITIVITY: 0.4 mV
MDC IDC STAT BRADY RV PERCENT PACED: 97 %

## 2017-04-16 NOTE — Telephone Encounter (Signed)
Spoke with pt and reminded pt of remote transmission that is due today. Pt verbalized understanding.   

## 2017-04-16 NOTE — Patient Instructions (Addendum)
Medication Instructions:  Your physician recommends that you continue on your current medications as directed. Please refer to the Current Medication list given to you today.   Labwork: None ordered   Testing/Procedures: None ordered   Follow-Up: Your physician wants you to follow-up in: 12 months with Chanetta Marshall, NP You will receive a reminder letter in the mail two months in advance. If you don't receive a letter, please call our office to schedule the follow-up appointment.  Remote monitoring is used to monitor your  ICD from home. This monitoring reduces the number of office visits required to check your device to one time per year. It allows [place] to keep an eye on the functioning of your device to ensure it is working properly. You are scheduled for a device check from home on 07/16/16. You may send your transmission at any time that day. If you have a wireless device, the transmission will be sent automatically. After your physician reviews your transmission, you will receive a postcard with your next transmission date.  You have been referred to Dr. Hassell Done for skin removal post gastric bypass ---05/11/17 at 3:30pm [place]

## 2017-04-16 NOTE — Progress Notes (Signed)
PCP: Vicenta Aly, PA Primary Cardiologist:  Dr Haroldine Laws Primary EP: Dr Sonia Side is a 51 y.o. female who presents today for routine electrophysiology followup.  Since last being seen in our clinic, the patient reports doing very well.  Her primary concern is with backpain.  She has lost 65 lbs and has a large pannus.  She would like to talk to a surgeon about removal.  She also has a L chest port which she would like to have removed (hasnt been accessed since 2013 or so).  Today, she denies symptoms of palpitations, chest pain, shortness of breath,  lower extremity edema, dizziness, presyncope, syncope, or ICD shocks.  The patient is otherwise without complaint today.   Past Medical History:  Diagnosis Date  . CHF (congestive heart failure) ([place])   . H/O angiography    unsure of any PCI  . Hypertension   . Lymphoma ([place])    s/p chemotherapy 2012, radiation in 2000  . Multiple thyroid nodules   . Non-ischemic cardiomyopathy ([place])   . OSA (obstructive sleep apnea)    a. mild by sleep study 08/2013 - trial of weight loss;  refer to sleep med if unsucessful  . Respiratory failure ([place])    [place] [facility] and [facility]   Past Surgical History:  Procedure Laterality Date  . BI-VENTRICULAR IMPLANTABLE CARDIOVERTER DEFIBRILLATOR  (CRT-D) Right 12/03/2013   SJM Dorma Russell Assura implanted in [place] for primary prevention  . CHOLECYSTECTOMY    . FEMUR SURGERY    . KNEE SURGERY    . porta cath insertion     2 years  . RIGHT HEART CATHETERIZATION N/A 09/19/2013   Procedure: RIGHT HEART CATH;  Surgeon: Jolaine Artist, MD;  Location: [facility] CATH LAB;  Service: Cardiovascular;  Laterality: N/A;  . TUBAL LIGATION      ROS- all systems are reviewed and negative except as per HPI above  Current Outpatient Medications  Medication Sig Dispense Refill  . carvedilol (COREG) 25 MG tablet Take 1 tablet (25 mg total) by mouth 2 (two) times daily with a meal. 60  tablet 3  . Cholecalciferol (VITAMIN D) 2000 UNITS tablet Take 2,000 Units by mouth daily.    . potassium chloride SA (K-DUR,KLOR-CON) 20 MEQ tablet Take 1 tablet (20 mEq total) by mouth 2 (two) times daily. 60 tablet 3  . sacubitril-valsartan (ENTRESTO) 97-103 MG Take 1 tablet by mouth 2 (two) times daily. 60 tablet 5  . spironolactone (ALDACTONE) 25 MG tablet Take 0.5 tablets (12.5 mg total) by mouth at bedtime. 15 tablet 3  . torsemide (DEMADEX) 20 MG tablet Take 1 tablet (20 mg total) by mouth 2 (two) times daily. 60 tablet 3   No current facility-administered medications for this visit.     Physical Exam: Vitals:   04/16/17 0948  BP: 118/90  Pulse: 66  SpO2: 98%  Weight: 222 lb 3.2 oz (100.8 kg)  Height: 5' 6.5" (1.689 m)    GEN- The patient is well appearing, alert and oriented x 3 today.   Head- normocephalic, atraumatic Eyes-  Sclera clear, conjunctiva pink Ears- hearing intact Oropharynx- clear Lungs- Clear to ausculation bilaterally, normal work of breathing Chest- ICD pocket is well healed, L chest port palpated Heart- Regular rate and rhythm, no murmurs, rubs or gallops, PMI not laterally displaced GI- soft, NT, ND, + BS Extremities- no clubbing, cyanosis, or edema  ICD interrogation- reviewed in detail today,  See PACEART report  Echo 03/10/16 is reviewed,  EF 35-40%  ekg tracing ordered today is personally reviewed and shows sinus rhythm with V pacing  Assessment and Plan:  1.  Chronic systolic dysfunction/ nonischemic CM euvolemic today Stable on an appropriate medical regimen Normal ICD function See Pace Art report No changes today Followed in ICM device clinic with Sharman Cheek  2. Obesity Body mass index is 35.33 kg/m. She has lost 65 lbs previously with gastric sleeve Will refer to Dr Hassell Done for discussions about port removal and pannus removal  3. HTN Stable No change required today  [place] Return to see EP NP in a year  Thompson Grayer MD,  [facility] 04/16/2017 9:54 AM

## 2017-04-19 NOTE — Progress Notes (Signed)
ICM Remote Transmission changed from 04/16/2017 to 05/17/2017 due to patient had in office defib check on 04/16/2017

## 2017-05-16 ENCOUNTER — Other Ambulatory Visit (HOSPITAL_COMMUNITY): Payer: Self-pay | Admitting: Internal Medicine

## 2017-05-17 ENCOUNTER — Telehealth: Payer: Self-pay | Admitting: Cardiology

## 2017-05-17 ENCOUNTER — Ambulatory Visit (INDEPENDENT_AMBULATORY_CARE_PROVIDER_SITE_OTHER): Payer: 59

## 2017-05-17 DIAGNOSIS — Z95810 Presence of automatic (implantable) cardiac defibrillator: Secondary | ICD-10-CM

## 2017-05-17 DIAGNOSIS — I5042 Chronic combined systolic (congestive) and diastolic (congestive) heart failure: Secondary | ICD-10-CM | POA: Diagnosis not present

## 2017-05-17 NOTE — Telephone Encounter (Signed)
Spoke with pt and reminded pt of remote transmission that is due today. Pt verbalized understanding.   

## 2017-05-24 NOTE — Progress Notes (Signed)
EPIC Encounter for ICM Monitoring  Patient Name: Darlene Pierce is a 52 y.o. female Date: 05/24/2017 Primary Care Physican: Vicenta Aly, [place] Primary Cardiologist: [place] Electrophysiologist: Allred Dry Weight:last weight 209lbs Bi-V Pacing: 97%      Transmission reviewed.    Thoracic impedance normal.  Prescribed dosage: Torsemide 20 mg 1 tablet twice a day  Labs: 02/28/2016 Creatinine 0.85, BUN 14, Potassium 4.2, Sodium 138, EGFR >60  Recommendations: None  Follow-up plan: ICM clinic phone appointment on 06/19/2017.    Copy of ICM check sent to Dr. Rayann Heman.   3 month ICM trend: 05/19/2017    1 Year ICM trend:       [name], RN 05/24/2017 10:53 AM

## 2017-06-19 ENCOUNTER — Telehealth: Payer: Self-pay | Admitting: Cardiology

## 2017-06-19 NOTE — Telephone Encounter (Signed)
Attempted to confirm remote transmission with pt. No answer and was unable to leave a message.   

## 2017-06-28 NOTE — Progress Notes (Signed)
No ICM remote transmission received for 06/19/2017 and next ICM transmission scheduled for 07/16/2017.

## 2017-07-05 ENCOUNTER — Telehealth: Payer: Self-pay | Admitting: Cardiology

## 2017-07-05 NOTE — Telephone Encounter (Signed)
Spoke w/ pt and requested that she send a manual transmission b/c her home monitor has not updated in at least 14 days.   

## 2017-07-16 ENCOUNTER — Ambulatory Visit (INDEPENDENT_AMBULATORY_CARE_PROVIDER_SITE_OTHER): Payer: 59 | Admitting: *Deleted

## 2017-07-16 ENCOUNTER — Telehealth: Payer: Self-pay | Admitting: Cardiology

## 2017-07-16 DIAGNOSIS — I5042 Chronic combined systolic (congestive) and diastolic (congestive) heart failure: Secondary | ICD-10-CM | POA: Diagnosis not present

## 2017-07-16 DIAGNOSIS — Z95810 Presence of automatic (implantable) cardiac defibrillator: Secondary | ICD-10-CM | POA: Diagnosis not present

## 2017-07-16 DIAGNOSIS — I428 Other cardiomyopathies: Secondary | ICD-10-CM

## 2017-07-16 NOTE — Progress Notes (Signed)
Remote ICD transmission.   

## 2017-07-16 NOTE — Telephone Encounter (Signed)
Spoke with pt and reminded pt of remote transmission that is due today. Pt verbalized understanding.   

## 2017-07-16 NOTE — Progress Notes (Signed)
EPIC Encounter for ICM Monitoring  Patient Name: Darlene Pierce is a 51 y.o. female Date: 07/16/2017 Primary Care Physican: Aydt, Melanie, PA Primary Cardiologist: [place] Electrophysiologist: Allred Dry Weight:220lbs Bi-V Pacing: 97%      Heart Failure questions reviewed, pt asymptomatic.   Thoracic impedance normal but was abnormal suggesting fluid accumulation 06/12/2017 to 06/27/2017, 06/23/2017 to 06/27/2017, 07/06/2017 to 07/08/2017.  She said she has taken extra Torsemide to help with fluid.  Prescribed dosage: Torsemide 20 mg 1 tablet twice a day  Labs: 03/16/2018 Creatinine 0.91, BUN 14, Potassium 3.0, Sodium 139, EGFR >60 03/06/2017 Creatinine 1.00, BUN 15, Potassium 3.4, Sodium 137, EGFR >60 02/28/2016 Creatinine 0.85, BUN 14, Potassium 4.2, Sodium 138, EGFR >60  Recommendations: No changes.  Encouraged to call for fluid symptoms.  Follow-up plan: ICM clinic phone appointment on 08/16/2017.    Copy of ICM check sent to Dr. Allred.   3 month ICM trend: 07/16/2017    1 Year ICM trend:       Laurie S Short, RN 07/16/2017 2:38 PM   

## 2017-07-17 DIAGNOSIS — M545 Low back pain, unspecified: Secondary | ICD-10-CM | POA: Insufficient documentation

## 2017-07-19 ENCOUNTER — Encounter: Payer: Self-pay | Admitting: Cardiology

## 2017-07-31 DIAGNOSIS — M5136 Other intervertebral disc degeneration, lumbar region: Secondary | ICD-10-CM | POA: Insufficient documentation

## 2017-08-02 LAB — CUP PACEART REMOTE DEVICE CHECK
Battery Remaining Longevity: 42 mo
Battery Voltage: 2.93 V
Brady Statistic AP VP Percent: 3.8 %
Brady Statistic RA Percent Paced: 3.9 %
HIGH POWER IMPEDANCE MEASURED VALUE: 69 Ohm
HighPow Impedance: 69 Ohm
Implantable Lead Implant Date: 20150715
Implantable Lead Location: 753858
Implantable Lead Location: 753860
Implantable Pulse Generator Implant Date: 20150715
Lead Channel Impedance Value: 410 Ohm
Lead Channel Impedance Value: 510 Ohm
Lead Channel Impedance Value: 610 Ohm
Lead Channel Pacing Threshold Amplitude: 0.75 V
Lead Channel Pacing Threshold Amplitude: 0.75 V
Lead Channel Pacing Threshold Pulse Width: 0.4 ms
Lead Channel Pacing Threshold Pulse Width: 0.4 ms
Lead Channel Pacing Threshold Pulse Width: 0.4 ms
Lead Channel Sensing Intrinsic Amplitude: 11.7 mV
Lead Channel Setting Pacing Amplitude: 2 V
Lead Channel Setting Pacing Amplitude: 2 V
Lead Channel Setting Pacing Pulse Width: 0.4 ms
Lead Channel Setting Sensing Sensitivity: 0.4 mV
MDC IDC LEAD IMPLANT DT: 20150715
MDC IDC LEAD IMPLANT DT: 20150715
MDC IDC LEAD LOCATION: 753859
MDC IDC MSMT BATTERY REMAINING PERCENTAGE: 49 %
MDC IDC MSMT LEADCHNL RA PACING THRESHOLD AMPLITUDE: 0.5 V
MDC IDC MSMT LEADCHNL RA SENSING INTR AMPL: 5 mV
MDC IDC SESS DTM: 20190225185248
MDC IDC SET LEADCHNL LV PACING PULSEWIDTH: 0.4 ms
MDC IDC SET LEADCHNL RV PACING AMPLITUDE: 2 V
MDC IDC STAT BRADY AP VS PERCENT: 1 %
MDC IDC STAT BRADY AS VP PERCENT: 94 %
MDC IDC STAT BRADY AS VS PERCENT: 2.4 %
Pulse Gen Serial Number: 7198577

## 2017-08-06 ENCOUNTER — Other Ambulatory Visit (HOSPITAL_COMMUNITY): Payer: Self-pay | Admitting: *Deleted

## 2017-08-06 MED ORDER — TORSEMIDE 20 MG PO TABS: 20.0000 mg | ORAL_TABLET | Freq: Two times a day (BID) | ORAL | 3 refills | Status: DC

## 2017-08-16 ENCOUNTER — Telehealth: Payer: Self-pay

## 2017-08-16 ENCOUNTER — Ambulatory Visit (INDEPENDENT_AMBULATORY_CARE_PROVIDER_SITE_OTHER): Payer: 59

## 2017-08-16 DIAGNOSIS — I5042 Chronic combined systolic (congestive) and diastolic (congestive) heart failure: Secondary | ICD-10-CM

## 2017-08-16 DIAGNOSIS — Z95810 Presence of automatic (implantable) cardiac defibrillator: Secondary | ICD-10-CM | POA: Diagnosis not present

## 2017-08-16 NOTE — Progress Notes (Signed)
EPIC Encounter for ICM Monitoring  Patient Name: Darlene Pierce is a 52 y.o. female Date: 08/16/2017 Primary Care Physican: Vicenta Aly, [place] Primary Cardiologist: [place] Electrophysiologist: Allred Dry Weight:220lbs Bi-V Pacing: 97%       Heart Failure questions reviewed, pt asymptomatic.  Patient's mother passed away 09/03/2017.   Advised symptoms of dryness are dizziness, lightheadedness, and feeling faint.   Thoracic impedance abnormal suggesting dryness since 08/10/2017.  Prescribed dosage: Torsemide 20 mg 1 tablet twice a day  Labs: 03/16/2017 Creatinine 0.91, BUN 14, Potassium 3.0, Sodium 139, EGFR >60 03/06/2017 Creatinine 1.00, BUN 15, Potassium 3.4, Sodium 137, EGFR >60 02/28/2016 Creatinine 0.85, BUN 14, Potassium 4.2, Sodium 138, EGFR >60  Recommendations:  Advised to drink extra fluid for next 2 days since she looks like she may be dry.  Advised to try to drink about 64 oz a day.   Follow-up plan: ICM clinic phone appointment on 09/17/2017.    Copy of ICM check sent to Dr. Rayann Heman.   3 month ICM trend: 08/16/2017    1 Year ICM trend:       [name], RN 08/16/2017 12:13 PM

## 2017-08-16 NOTE — Telephone Encounter (Signed)
Spoke with pt and reminded pt of remote transmission that is due today. Pt verbalized understanding.  Patient's mother passed away yesterday.

## 2017-09-17 ENCOUNTER — Ambulatory Visit (INDEPENDENT_AMBULATORY_CARE_PROVIDER_SITE_OTHER): Payer: 59

## 2017-09-17 ENCOUNTER — Telehealth: Payer: Self-pay

## 2017-09-17 DIAGNOSIS — Z95810 Presence of automatic (implantable) cardiac defibrillator: Secondary | ICD-10-CM

## 2017-09-17 DIAGNOSIS — I5042 Chronic combined systolic (congestive) and diastolic (congestive) heart failure: Secondary | ICD-10-CM | POA: Diagnosis not present

## 2017-09-17 NOTE — Telephone Encounter (Signed)
Spoke with pt and reminded pt of remote transmission that is due today. Pt verbalized understanding.   

## 2017-09-18 NOTE — Progress Notes (Signed)
EPIC Encounter for ICM Monitoring  Patient Name: Darlene Pierce is a 51 y.o. female Date: 09/18/2017 Primary Care Physican: Aydt, Melanie, PA Primary Cardiologist: [place] Electrophysiologist: Allred Dry Weight:230lbs Bi-V Pacing: 97%      Heart Failure questions reviewed, pt's weight has increased from 220 lbs to 230 lbs since 08/16/2017.  She does not weight daily and advised importance of daily weights.   2 ER visits on 4/17 and 4/19 due to Gout   Thoracic impedance abnormal suggesting fluid accumulation since 09/08/2017.  Impedance also decrease from 08/18/2017 to 08/31/2017.  Prescribed dosage: Torsemide 20 mg 1 tablet twice a day. Spirolactone 25 mg take 0.5 tablet (12.5mg total) daily  Labs: 03/16/2017 Creatinine 0.91, BUN 14, Potassium 3.0, Sodium 139, EGFR >60 03/06/2017 Creatinine 1.00, BUN 15, Potassium 3.4, Sodium 137, EGFR >60 02/28/2016 Creatinine 0.85, BUN 14, Potassium 4.2, Sodium 138, EGFR >60  Recommendations:  Advised will ask Dr Bensimhon and Dr Allred for review and recommendation if Torsemide should be increased since had Gout exacerbation on 4/19.   Follow-up plan: ICM clinic phone appointment on 09/27/2017.    Copy of ICM check sent to Dr. Bensimhon and Dr. Allred.   3 month ICM trend: 09/17/2017    1 Year ICM trend:       Laurie S Short, RN 09/18/2017 1:40 PM   

## 2017-09-21 NOTE — Progress Notes (Signed)
Attempted ICM follow up call to check if patient is having any symptoms.  Voice mail box has not been set up yet.

## 2017-09-27 ENCOUNTER — Telehealth: Payer: Self-pay | Admitting: Cardiology

## 2017-09-27 ENCOUNTER — Ambulatory Visit (INDEPENDENT_AMBULATORY_CARE_PROVIDER_SITE_OTHER): Payer: Self-pay

## 2017-09-27 DIAGNOSIS — I5042 Chronic combined systolic (congestive) and diastolic (congestive) heart failure: Secondary | ICD-10-CM

## 2017-09-27 DIAGNOSIS — Z95810 Presence of automatic (implantable) cardiac defibrillator: Secondary | ICD-10-CM

## 2017-09-27 NOTE — Telephone Encounter (Signed)
Spoke with pt and reminded pt of remote transmission that is due today. Pt verbalized understanding.   

## 2017-09-28 ENCOUNTER — Telehealth: Payer: Self-pay

## 2017-09-28 NOTE — Telephone Encounter (Signed)
Remote ICM transmission received.  Attempted call to patient and voice mail box has not been set up yet.

## 2017-09-28 NOTE — Progress Notes (Signed)
EPIC Encounter for ICM Monitoring  Patient Name: Darlene Pierce is a 51 y.o. female Date: 09/28/2017 Primary Care Physican: Aydt, Melanie, PA Primary Cardiologist: [place] Electrophysiologist: Allred Dry Weight:Previous weight 230lbs Bi-V Pacing: 97%       Attempted call to patient and unable to reach.    Transmission reviewed.    Thoracic impedance returned to normal since 09/17/2017 transmission.  Prescribed dosage: Torsemide 20 mg 1 tablet twice a day. Spirolactone 25 mg take 0.5 tablet (12.5mg total) daily  Labs: 10/26/2018Creatinine 0.91, BUN 14, Potassium 3.0, Sodium 139, EGFR >60 03/06/2017 Creatinine 1.00, BUN 15, Potassium 3.4, Sodium 137, EGFR >60 02/28/2016 Creatinine 0.85, BUN 14, Potassium 4.2, Sodium 138, EGFR >60  Recommendations: NONE - Unable to reach.  Follow-up plan: ICM clinic phone appointment on 10/22/2017.   Copy of ICM check sent to Dr. Allred.   3 month ICM trend: 09/28/2017    1 Year ICM trend:       Laurie S Short, RN 09/28/2017 9:41 AM   

## 2017-10-16 ENCOUNTER — Emergency Department (HOSPITAL_BASED_OUTPATIENT_CLINIC_OR_DEPARTMENT_OTHER)
Admission: EM | Admit: 2017-10-16 | Discharge: 2017-10-16 | Disposition: A | Discharge status: Home / Self Care | Payer: 59 | Attending: Emergency Medicine | Admitting: Emergency Medicine

## 2017-10-16 ENCOUNTER — Encounter (HOSPITAL_BASED_OUTPATIENT_CLINIC_OR_DEPARTMENT_OTHER): Payer: Self-pay

## 2017-10-16 ENCOUNTER — Other Ambulatory Visit: Payer: Self-pay

## 2017-10-16 ENCOUNTER — Emergency Department (HOSPITAL_BASED_OUTPATIENT_CLINIC_OR_DEPARTMENT_OTHER): Payer: 59

## 2017-10-16 DIAGNOSIS — I5042 Chronic combined systolic (congestive) and diastolic (congestive) heart failure: Secondary | ICD-10-CM | POA: Insufficient documentation

## 2017-10-16 DIAGNOSIS — Z79899 Other long term (current) drug therapy: Secondary | ICD-10-CM | POA: Diagnosis not present

## 2017-10-16 DIAGNOSIS — R2241 Localized swelling, mass and lump, right lower limb: Secondary | ICD-10-CM | POA: Insufficient documentation

## 2017-10-16 DIAGNOSIS — I110 Hypertensive heart disease with heart failure: Secondary | ICD-10-CM | POA: Insufficient documentation

## 2017-10-16 DIAGNOSIS — M25561 Pain in right knee: Secondary | ICD-10-CM | POA: Diagnosis present

## 2017-10-16 MED ORDER — HYDROCODONE-ACETAMINOPHEN 5-325 MG PO TABS: 1.0000 | ORAL_TABLET | Freq: Four times a day (QID) | ORAL | 0 refills | Status: DC | PRN

## 2017-10-16 NOTE — ED Provider Notes (Signed)
[facility] EMERGENCY DEPARTMENT Provider Note   CSN: [id number] Arrival date & time: 10/16/17  1550     History   Chief Complaint Chief Complaint  Patient presents with  . Knee Pain    HPI Darlene Pierce is a 52 y.o. female.  HPI Patient presents with right knee pain.  Has had for the last weeks.  Worse with walking.  Hurts at night also.  Worse with certain movements.  No fevers.  Has had some previous swelling in the knee.  States she had meniscus surgery on the other knee and this feels somewhat like that.  No trauma.  Has had previous traction on her thigh but that was higher up. Past Medical History:  Diagnosis Date  . CHF (congestive heart failure) ([place])   . H/O angiography    unsure of any PCI  . Hypertension   . Lymphoma ([place])    s/p chemotherapy 2012, radiation in 2000  . Multiple thyroid nodules   . Non-ischemic cardiomyopathy ([place])   . OSA (obstructive sleep apnea)    a. mild by sleep study 08/2013 - trial of weight loss;  refer to sleep med if unsucessful  . Respiratory failure ([place])    [facility] and was on [facility]ator    Patient Active Problem List   Diagnosis Date Noted  . Cardiogenic shock ([place]) 09/22/2013  . Acute renal failure ([place]) 09/22/2013  . Renal insufficiency 09/21/2013  . Cardiomyopathy ([place]) 09/21/2013  . Hypotension arterial 09/20/2013  . Acute on chronic systolic and diastolic heart failure, NYHA class 4 ([place]) 09/17/2013  . OSA (obstructive sleep apnea) 09/04/2013  . Chronic combined systolic and diastolic heart failure ([place]) 06/12/2013  . NICM (nonischemic cardiomyopathy) ([place]) 06/12/2013  . Lymphoma ([place])   . Hypertension   . Multiple thyroid nodules     Past Surgical History:  Procedure Laterality Date  . BI-VENTRICULAR IMPLANTABLE CARDIOVERTER DEFIBRILLATOR  (CRT-D) Right 12/03/2013   SJM Dorma Russell Assura implanted in [place] for primary prevention  . CHOLECYSTECTOMY    . FEMUR SURGERY    . KNEE  SURGERY    . porta cath insertion     2 years  . RIGHT HEART CATHETERIZATION N/A 09/19/2013   Procedure: RIGHT HEART CATH;  Surgeon: Jolaine Artist, MD;  Location: [facility] CATH LAB;  Service: Cardiovascular;  Laterality: N/A;  . TUBAL LIGATION       OB History   None      Home Medications    Prior to Admission medications   Medication Sig Start Date End Date Taking? Authorizing Provider  carvedilol (COREG) 25 MG tablet Take 1 tablet (25 mg total) by mouth 2 (two) times daily with a meal. 03/16/17   Bensimhon, Shaune Pascal, MD  Cholecalciferol (VITAMIN D) 2000 UNITS tablet Take 2,000 Units by mouth daily.    [provider]  ENTRESTO 97-103 MG TAKE 1 TABLET BY MOUTH TWICE DAILY 05/17/17   Bensimhon, Shaune Pascal, MD  HYDROcodone-acetaminophen (NORCO/VICODIN) 5-325 MG tablet Take 1-2 tablets by mouth every 6 (six) hours as needed. 10/16/17   Davonna Belling, MD  potassium chloride SA (K-DUR,KLOR-CON) 20 MEQ tablet Take 1 tablet (20 mEq total) by mouth 2 (two) times daily. 03/16/17   Arbutus Leas, NP  spironolactone (ALDACTONE) 25 MG tablet Take 0.5 tablets (12.5 mg total) by mouth at bedtime. 03/16/17 06/14/17  Bensimhon, Shaune Pascal, MD  torsemide (DEMADEX) 20 MG tablet Take 1 tablet (20 mg total) by mouth 2 (two) times  daily. 08/06/17   Bensimhon, Shaune Pascal, MD    Family History Family History  Problem Relation Age of Onset  . CAD Mother     Social History Social History   Tobacco Use  . Smoking status: Never Smoker  . Smokeless tobacco: Never Used  Substance Use Topics  . Alcohol use: Yes    Comment: occ  . Drug use: No     Allergies   Patient has no known allergies.   Review of Systems Review of Systems  Constitutional: Negative for appetite change.  Cardiovascular: Negative for leg swelling.  Musculoskeletal: Negative for joint swelling and neck pain.       Right knee pain.  Skin: Negative for rash and wound.  Neurological: Negative for weakness and numbness.      Physical Exam Updated Vital Signs BP 128/62 (BP Location: Left Arm)   Pulse 94   Temp 98.4 F (36.9 C) (Oral)   Resp 18   Ht 5' 6.5" (1.689 m)   Wt 102.3 kg (225 lb 7 oz)   SpO2 95%   BMI 35.84 kg/m   Physical Exam  Constitutional: She appears well-developed.  HENT:  Head: Normocephalic.  Neck: Neck supple.  Musculoskeletal: She exhibits tenderness.  Tenderness over right knee medially.  Pain with valgus strain.  No pain with axial loading.  Good range of motion of the knee.  No large effusion.  Scar proximal to the knee from previous traction.  Neurological: She is alert.  Skin: Skin is warm. Capillary refill takes less than 2 seconds.  Psychiatric: She has a normal mood and affect.     ED Treatments / Results  Labs (all labs ordered are listed, but only abnormal results are displayed) Labs Reviewed - No data to display  EKG None  Radiology Dg Knee Complete 4 Views Right  Result Date: 10/16/2017 CLINICAL DATA:  52 year old female with medial and patella right knee pain for several weeks. No recent injury. EXAM: RIGHT KNEE - COMPLETE 4+ VIEW COMPARISON:  None. FINDINGS: Bone mineralization is within normal limits. Possible small suprapatellar joint effusion. Mild medial compartment joint space loss and degenerative spurring. Mild patellofemoral compartment degenerative spurring. No acute osseous abnormality identified. IMPRESSION: 1. Possible small joint effusion but no acute osseous abnormality identified. 2. Mild for age degenerative changes in the medial and patellofemoral compartments. Electronically Signed   By: Genevie Ann M.D.   On: 10/16/2017 16:34    Procedures Procedures (including critical care time)  Medications Ordered in ED Medications - No data to display   Initial Impression / Assessment and Plan / ED Course  I have reviewed the triage vital signs and the nursing notes.  Pertinent labs & imaging results that were available during my care of the  patient were reviewed by me and considered in my medical decision making (see chart for details).     Patient with knee pain.  Pain is medial.  No effusion.  Doubt infection.  Has been using a brace at home with some relief.  Has Ortho follow-up with [place] in a month.  Will have follow-up with sports medicine.  Final Clinical Impressions(s) / ED Diagnoses   Final diagnoses:  Acute pain of right knee    ED Discharge Orders        Ordered    HYDROcodone-acetaminophen (NORCO/VICODIN) 5-325 MG tablet  Every 6 hours PRN     10/16/17 1726       Davonna Belling, MD 10/16/17 1727

## 2017-10-16 NOTE — ED Notes (Signed)
ED Provider at bedside. 

## 2017-10-16 NOTE — ED Notes (Addendum)
ED Provider at bedside, pt ambulatory back to stretcher without difficulty

## 2017-10-16 NOTE — ED Notes (Signed)
Pt verbalizes understanding of d/c instructions and denies any further needs at this time. 

## 2017-10-16 NOTE — ED Triage Notes (Addendum)
C/o pain to right knee x "weeks"-denies injury-sates she feels there is fluid buildup-NAD-limping gait

## 2017-10-22 ENCOUNTER — Ambulatory Visit (INDEPENDENT_AMBULATORY_CARE_PROVIDER_SITE_OTHER): Payer: 59 | Admitting: Family Medicine

## 2017-10-22 ENCOUNTER — Telehealth: Payer: Self-pay | Admitting: Cardiology

## 2017-10-22 ENCOUNTER — Encounter: Payer: Self-pay | Admitting: Family Medicine

## 2017-10-22 ENCOUNTER — Ambulatory Visit (INDEPENDENT_AMBULATORY_CARE_PROVIDER_SITE_OTHER): Payer: 59 | Admitting: *Deleted

## 2017-10-22 DIAGNOSIS — M25561 Pain in right knee: Secondary | ICD-10-CM | POA: Diagnosis not present

## 2017-10-22 DIAGNOSIS — I428 Other cardiomyopathies: Secondary | ICD-10-CM

## 2017-10-22 DIAGNOSIS — I5042 Chronic combined systolic (congestive) and diastolic (congestive) heart failure: Secondary | ICD-10-CM | POA: Diagnosis not present

## 2017-10-22 DIAGNOSIS — Z95810 Presence of automatic (implantable) cardiac defibrillator: Secondary | ICD-10-CM

## 2017-10-22 MED ORDER — METHYLPREDNISOLONE ACETATE 40 MG/ML IJ SUSP
40.0000 mg | Freq: Once | INTRAMUSCULAR | Status: AC
  Administered 2017-10-22: 40 mg via INTRA_ARTICULAR

## 2017-10-22 NOTE — Patient Instructions (Signed)
Your pain is due to arthritis and/or medial meniscus tear - your testing is positive for both. These are the different medications you can take for this: Tylenol 500mg  1-2 tabs three times a day for pain. Capsaicin, aspercreme, or biofreeze topically up to four times a day may also help with pain. Some supplements that may help for arthritis: Boswellia extract, curcumin, pycnogenol Aleve 1-2 tabs twice a day with food as needed. Cortisone injections are an option - you were given this today. It's important that you continue to stay active. Straight leg raises, knee extensions 3 sets of 10 once a day (add ankle weight if these become too easy). Consider physical therapy to strengthen muscles around the joint that hurts to take pressure off of the joint itself. Shoe inserts with good arch support may be helpful. Heat or ice 15 minutes at a time 3-4 times a day as needed to help with pain. Water aerobics and cycling with low resistance are the best two types of exercise for arthritis though any exercise is ok as long as it doesn't worsen the pain. Follow up with me in 6 weeks but let me know how you're doing in a week - if you're not making progress we will likely go ahead with an MRI.

## 2017-10-22 NOTE — Telephone Encounter (Signed)
LMOVM reminding pt to send remote transmission.   

## 2017-10-23 ENCOUNTER — Encounter: Payer: Self-pay | Admitting: Family Medicine

## 2017-10-23 ENCOUNTER — Telehealth: Payer: Self-pay

## 2017-10-23 DIAGNOSIS — M25561 Pain in right knee: Secondary | ICD-10-CM | POA: Insufficient documentation

## 2017-10-23 NOTE — Assessment & Plan Note (Signed)
consistent with flare of arthritis vs degenerative medial meniscus tear.  Discussed tylenol, topical medications, aleve, supplements that may help.  Intraarticular injection given as well.  Home exercises reviewed.  Ice/heat if needed. F/u in 6 weeks.  After informed written consent timeout was performed, patient was seated on exam table. Right knee was prepped with alcohol swab and utilizing anteromedial approach, patient's right knee was injected intraarticularly with 3:1 bupivicaine: depomedrol. Patient tolerated the procedure well without immediate complications.

## 2017-10-23 NOTE — Progress Notes (Signed)
EPIC Encounter for ICM Monitoring  Patient Name: Darlene Pierce is a 52 y.o. female Date: 10/23/2017 Primary Care Physican: Aydt, Melanie, PA Primary Cardiologist: Bensimhon Electrophysiologist: Allred Dry Weight: 222lbs (office weight 10/22/2017) Bi-V Pacing: 97%       Attempted call to patient and unable to reach.   Transmission reviewed.    Thoracic impedance abnormal suggesting fluid accumulation from 09/30/2017 - 10/14/2017 and 10/20/2017 until 10/23/2017 which is at baseline today.  Prescribed dosage: Torsemide 20 mg 1 tablet twice a day. Spirolactone 25 mg take 0.5 tablet (12.5mg total) daily  Labs: 10/26/2018Creatinine 0.91, BUN 14, Potassium 3.0, Sodium 139, EGFR >60 03/06/2017 Creatinine 1.00, BUN 15, Potassium 3.4, Sodium 137, EGFR >60  Recommendations: NONE - Unable to reach.  Follow-up plan: ICM clinic phone appointment on 11/26/2017.    Copy of ICM check sent to Dr. Allred.   3 month ICM trend: 10/23/2017    1 Year ICM trend:       Laurie S Short, RN 10/23/2017 2:37 PM   

## 2017-10-23 NOTE — Progress Notes (Signed)
Remote ICD transmission.   

## 2017-10-23 NOTE — Telephone Encounter (Signed)
Remote ICM transmission received.  Attempted call to patient and voice mail box has not been set up.  

## 2017-10-23 NOTE — Progress Notes (Signed)
PCP: Vicenta Aly, PA  Subjective:   HPI: Patient is a 52 y.o. female here for right knee pain.  Patient denies known injury or trauma. 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 States over past 2 weeks she's had worsening anteromedial right knee pain. Pain is sharp, up to 10/10 at times. Currently 0/10. Has tried icing, soaking, ibuprofen, tylenol arthritis. Knee brace helps. Worse going from sitting to standing and at night. No skin changes, numbness.  Past Medical History:  Diagnosis Date  . CHF (congestive heart failure) ([place])   . H/O angiography    unsure of any PCI  . Hypertension   . Lymphoma ([place])    s/p chemotherapy 2012, radiation in 2000  . Multiple thyroid nodules   . Non-ischemic cardiomyopathy ([place])   . OSA (obstructive sleep apnea)    a. mild by sleep study 08/2013 - trial of weight loss;  refer to sleep med if unsucessful  . Respiratory failure ([place])    [facility] and [facility]    Current Outpatient Medications on File Prior to Visit  Medication Sig Dispense Refill  . gabapentin (NEURONTIN) 300 MG capsule Take by mouth.    . Lorcaserin HCl ER (BELVIQ XR) 20 MG TB24 Take by mouth.    Marland Kitchen omeprazole (PRILOSEC) 40 MG capsule Take by mouth.    . carvedilol (COREG) 25 MG tablet Take 1 tablet (25 mg total) by mouth 2 (two) times daily with a meal. 60 tablet 3  . Cholecalciferol (VITAMIN D) 2000 UNITS tablet Take 2,000 Units by mouth daily.    Marland Kitchen ENTRESTO 97-103 MG TAKE 1 TABLET BY MOUTH TWICE DAILY 60 tablet 3  . HYDROcodone-acetaminophen (NORCO/VICODIN) 5-325 MG tablet Take 1-2 tablets by mouth every 6 (six) hours as needed. 6 tablet 0  . Multiple Vitamin (THERA) TABS Take by mouth.    . potassium chloride SA (K-DUR,KLOR-CON) 20 MEQ tablet Take 1 tablet (20 mEq total) by mouth 2 (two) times daily. 60 tablet 3  . spironolactone (ALDACTONE) 25 MG tablet Take 0.5 tablets (12.5 mg total) by mouth at bedtime. 15 tablet 3  . torsemide  (DEMADEX) 20 MG tablet Take 1 tablet (20 mg total) by mouth 2 (two) times daily. 60 tablet 3   No current facility-administered medications on file prior to visit.     Past Surgical History:  Procedure Laterality Date  . BI-VENTRICULAR IMPLANTABLE CARDIOVERTER DEFIBRILLATOR  (CRT-D) Right 12/03/2013   SJM Dorma Russell Assura implanted in [place] for primary prevention  . CHOLECYSTECTOMY    . FEMUR SURGERY    . KNEE SURGERY    . porta cath insertion     2 years  . RIGHT HEART CATHETERIZATION N/A 09/19/2013   Procedure: RIGHT HEART CATH;  Surgeon: Jolaine Artist, MD;  Location: [facility] CATH LAB;  Service: Cardiovascular;  Laterality: N/A;  . TUBAL LIGATION      No Known Allergies  Social History   Socioeconomic History  . Marital status: Single    Spouse name: Not on file  . Number of children: 4  . Years of education: Not on file  . Highest education level: Not on file  Occupational History    Employer: [employer]  Social Needs  . Financial resource strain: Not on file  . Food insecurity:    Worry: Not on file    Inability: Not on file  . Transportation needs:    Medical: Not on file  Non-medical: Not on file  Tobacco Use  . Smoking status: Never Smoker  . Smokeless tobacco: Never Used  Substance and Sexual Activity  . Alcohol use: Yes    Comment: occ  . Drug use: No  . Sexual activity: Not on file  Lifestyle  . Physical activity:    Days per week: Not on file    Minutes per session: Not on file  . Stress: Not on file  Relationships  . Social connections:    Talks on phone: Not on file    Gets together: Not on file    Attends religious service: Not on file    Active member of club or organization: Not on file    Attends meetings of clubs or organizations: Not on file    Relationship status: Not on file  . Intimate partner violence:    Fear of current or ex partner: Not on file    Emotionally abused: Not on file    Physically abused: Not on file    Forced  sexual activity: Not on file  Other Topics Concern  . Not on file  Social History Narrative  . Not on file    Family History  Problem Relation Age of Onset  . CAD Mother     BP (!) 134/93   Pulse 75   Ht 5\' 7"  (1.702 m)   Wt 222 lb (100.7 kg)   BMI 34.77 kg/m   Review of Systems: See HPI above.     Objective:  Physical Exam:  Gen: NAD, comfortable in exam room  Right knee: No gross deformity, ecchymoses, effusion. TTP medial joint line.  No other tenderness FROM with 5/5 strength. Negative ant/post drawers. Negative valgus/varus testing. Negative lachmanns. Pain with mcmurrays and thessalys; negative apleys, patellar apprehension. NV intact distally.  Left knee: No deformity. FROM with 5/5 strength. No tenderness to palpation. NVI distally.   Assessment & Plan:  1. Right knee pain - consistent with flare of arthritis vs degenerative medial meniscus tear.  Discussed tylenol, topical medications, aleve, supplements that may help.  Intraarticular injection given as well.  Home exercises reviewed.  Ice/heat if needed. F/u in 6 weeks.  After informed written consent timeout was performed, patient was seated on exam table. Right knee was prepped with alcohol swab and utilizing anteromedial approach, patient's right knee was injected intraarticularly with 3:1 bupivicaine: depomedrol. Patient tolerated the procedure well without immediate complications.

## 2017-10-24 ENCOUNTER — Encounter: Payer: Self-pay | Admitting: Cardiology

## 2017-10-31 ENCOUNTER — Other Ambulatory Visit (HOSPITAL_COMMUNITY): Payer: Self-pay | Admitting: Internal Medicine

## 2017-11-01 ENCOUNTER — Telehealth (HOSPITAL_COMMUNITY): Payer: Self-pay | Admitting: *Deleted

## 2017-11-01 NOTE — Telephone Encounter (Signed)
Entresto 97-103 mg PA approved through [employer] from 11/01/17 through 11/01/2018.

## 2017-11-08 LAB — CUP PACEART REMOTE DEVICE CHECK
Battery Remaining Percentage: 45 %
Battery Voltage: 2.92 V
Brady Statistic AP VS Percent: 1 %
Brady Statistic AS VS Percent: 2.4 %
Brady Statistic RA Percent Paced: 3.8 %
HIGH POWER IMPEDANCE MEASURED VALUE: 66 Ohm
HighPow Impedance: 66 Ohm
Implantable Lead Implant Date: 20150715
Implantable Lead Implant Date: 20150715
Implantable Lead Location: 753858
Implantable Lead Location: 753860
Implantable Pulse Generator Implant Date: 20150715
Lead Channel Impedance Value: 400 Ohm
Lead Channel Impedance Value: 580 Ohm
Lead Channel Pacing Threshold Amplitude: 0.5 V
Lead Channel Pacing Threshold Pulse Width: 0.4 ms
Lead Channel Pacing Threshold Pulse Width: 0.4 ms
Lead Channel Sensing Intrinsic Amplitude: 11.7 mV
Lead Channel Sensing Intrinsic Amplitude: 3.1 mV
Lead Channel Setting Pacing Amplitude: 2 V
Lead Channel Setting Pacing Amplitude: 2 V
Lead Channel Setting Pacing Pulse Width: 0.4 ms
MDC IDC LEAD IMPLANT DT: 20150715
MDC IDC LEAD LOCATION: 753859
MDC IDC MSMT BATTERY REMAINING LONGEVITY: 38 mo
MDC IDC MSMT LEADCHNL LV PACING THRESHOLD AMPLITUDE: 0.75 V
MDC IDC MSMT LEADCHNL RA IMPEDANCE VALUE: 430 Ohm
MDC IDC MSMT LEADCHNL RV PACING THRESHOLD AMPLITUDE: 0.875 V
MDC IDC MSMT LEADCHNL RV PACING THRESHOLD PULSEWIDTH: 0.4 ms
MDC IDC PG SERIAL: 7198577
MDC IDC SESS DTM: 20190604124608
MDC IDC SET LEADCHNL RV PACING AMPLITUDE: 2 V
MDC IDC SET LEADCHNL RV PACING PULSEWIDTH: 0.4 ms
MDC IDC SET LEADCHNL RV SENSING SENSITIVITY: 0.4 mV
MDC IDC STAT BRADY AP VP PERCENT: 3.6 %
MDC IDC STAT BRADY AS VP PERCENT: 94 %

## 2017-11-26 ENCOUNTER — Ambulatory Visit (INDEPENDENT_AMBULATORY_CARE_PROVIDER_SITE_OTHER): Payer: 59

## 2017-11-26 ENCOUNTER — Telehealth: Payer: Self-pay | Admitting: Cardiology

## 2017-11-26 DIAGNOSIS — Z95810 Presence of automatic (implantable) cardiac defibrillator: Secondary | ICD-10-CM | POA: Diagnosis not present

## 2017-11-26 DIAGNOSIS — I5042 Chronic combined systolic (congestive) and diastolic (congestive) heart failure: Secondary | ICD-10-CM | POA: Diagnosis not present

## 2017-11-26 NOTE — Telephone Encounter (Signed)
Spoke with pt and reminded pt of remote transmission that is due today. Pt verbalized understanding.   

## 2017-11-27 NOTE — Progress Notes (Signed)
EPIC Encounter for ICM Monitoring  Patient Name: Darlene Pierce is a 52 y.o. female Date: 11/27/2017 Primary Care Physican: Aydt, Melanie, PA Primary Cardiologist: Bensimhon Electrophysiologist: Allred Dry Weight:Previous weight 222lbs (office weight 10/22/2017) Bi-V Pacing: 97%                                                Attempted call to patient and unable to reach.  Left detailed message, per DPR, regarding transmission.  Transmission reviewed.    Thoracic impedance normal.  Prescribed dosage: Torsemide 20 mg 1 tablet twice a day. Spirolactone 25 mg take 0.5 tablet (12.5mg total) daily  Labs: 10/26/2018Creatinine 0.91, BUN 14, Potassium 3.0, Sodium 139, EGFR >60 03/06/2017 Creatinine 1.00, BUN 15, Potassium 3.4, Sodium 137, EGFR >60  Recommendations: Left voice mail with ICM number and encouraged to call if experiencing any fluid symptoms.  Follow-up plan: ICM clinic phone appointment on 12/27/2017.       Copy of ICM check sent to Dr. Allred.   3 month ICM trend: 11/27/2017    1 Year ICM trend:       Laurie S Short, RN 11/27/2017 8:09 AM   

## 2017-12-03 ENCOUNTER — Ambulatory Visit: Payer: 59 | Admitting: Family Medicine

## 2017-12-07 ENCOUNTER — Telehealth: Payer: Self-pay

## 2017-12-07 NOTE — Telephone Encounter (Signed)
I spoke with the patient about this.

## 2017-12-27 ENCOUNTER — Ambulatory Visit (INDEPENDENT_AMBULATORY_CARE_PROVIDER_SITE_OTHER): Payer: 59

## 2017-12-27 DIAGNOSIS — Z95810 Presence of automatic (implantable) cardiac defibrillator: Secondary | ICD-10-CM | POA: Diagnosis not present

## 2017-12-27 DIAGNOSIS — I5042 Chronic combined systolic (congestive) and diastolic (congestive) heart failure: Secondary | ICD-10-CM

## 2017-12-28 NOTE — Progress Notes (Signed)
EPIC Encounter for ICM Monitoring  Patient Name: Darlene Pierce is a 52 y.o. female Date: 12/28/2017 Primary Care Physican: Vicenta Aly, [place] Primary Cardiologist: [place] Electrophysiologist: Allred Dry Weight:222lbs  Bi-V Pacing: 97%      Heart Failure questions reviewed, pt asymptomatic.  She is going to start working with nutritionist to lose weight.    Thoracic impedance normal.  Prescribed dosage: Torsemide 20 mg 1 tablet twice a day. Spirolactone 25 mg take 0.5 tablet (12.'5mg'$  total) daily  Labs: 10/26/2018Creatinine 0.91, BUN 14, Potassium 3.0, Sodium 139, EGFR >60 03/06/2017 Creatinine 1.00, BUN 15, Potassium 3.4, Sodium 137, EGFR >60  Recommendations: No changes.    Encouraged to call for fluid symptoms.  Follow-up plan: ICM clinic phone appointment on 01/28/2018.    Copy of ICM check sent to Dr. Rayann Heman.   3 month ICM trend: 12/27/2017    1 Year ICM trend:      [name], RN 12/28/2017 4:42 PM

## 2018-01-28 ENCOUNTER — Ambulatory Visit (INDEPENDENT_AMBULATORY_CARE_PROVIDER_SITE_OTHER): Payer: 59

## 2018-01-28 ENCOUNTER — Ambulatory Visit (INDEPENDENT_AMBULATORY_CARE_PROVIDER_SITE_OTHER): Payer: 59 | Admitting: *Deleted

## 2018-01-28 ENCOUNTER — Telehealth: Payer: Self-pay

## 2018-01-28 DIAGNOSIS — I428 Other cardiomyopathies: Secondary | ICD-10-CM

## 2018-01-28 DIAGNOSIS — Z95810 Presence of automatic (implantable) cardiac defibrillator: Secondary | ICD-10-CM | POA: Diagnosis not present

## 2018-01-28 DIAGNOSIS — I5042 Chronic combined systolic (congestive) and diastolic (congestive) heart failure: Secondary | ICD-10-CM

## 2018-01-28 NOTE — Progress Notes (Signed)
Remote ICD transmission.   

## 2018-01-28 NOTE — Telephone Encounter (Signed)
Spoke with pt and reminded pt of remote transmission that is due today. Pt verbalized understanding.   

## 2018-01-29 ENCOUNTER — Telehealth: Payer: Self-pay

## 2018-01-29 NOTE — Telephone Encounter (Signed)
Remote ICM transmission received.  Attempted call to patient and left message, per DPR, to return call regarding transmission.

## 2018-01-29 NOTE — Progress Notes (Signed)
EPIC Encounter for ICM Monitoring  Patient Name: Darlene Pierce is a 52 y.o. female Date: 01/29/2018 Primary Care Physican: Vicenta Aly, [place] Primary Cardiologist: [place] Electrophysiologist: Allred Dry Weight:Previous weight222lbs  Bi-V Pacing: 97%        Attempted call to patient and unable to reach.  Left message to return call regarding transmission.  Transmission reviewed.    Thoracic impedance abnormal suggesting fluid accumulation starting 01/27/2018.  Prescribed dosage: Torsemide 20 mg 1 tablet twice a day. Spirolactone 25 mg take 0.5 tablet (12.'5mg'$  total) daily. Potassium 20 mEq Take 1 tablet (20 mEq total) by mouth 2 (two) times daily.  Labs: 10/26/2018Creatinine 0.91, BUN 14, Potassium 3.0, Sodium 139, EGFR >60 03/06/2017 Creatinine 1.00, BUN 15, Potassium 3.4, Sodium 137, EGFR >60  Recommendations: NONE - Unable to reach. If patient reached, will advise to increase Torsemide to 40 mg twice a day x 2 days and then returning to 20 mg twice a day and take Potassium 20 mEq 2 tablets twice a day x 2 days and then return to 1 tablet twice a day.  Follow-up plan: ICM clinic phone appointment on 02/04/2018 to recheck fluid levels.  Recall for Dr Rayann Heman 03/2018.  Was due for echo and appt with HF clinic in April 2019.     Copy of ICM check sent to Dr. Rayann Heman and Dr Haroldine Laws.   3 month ICM trend: 01/28/2018    1 Year ICM trend:       [name], RN 01/29/2018 12:59 PM

## 2018-02-04 ENCOUNTER — Ambulatory Visit (INDEPENDENT_AMBULATORY_CARE_PROVIDER_SITE_OTHER): Payer: 59

## 2018-02-04 DIAGNOSIS — I5042 Chronic combined systolic (congestive) and diastolic (congestive) heart failure: Secondary | ICD-10-CM

## 2018-02-04 DIAGNOSIS — Z95810 Presence of automatic (implantable) cardiac defibrillator: Secondary | ICD-10-CM

## 2018-02-05 ENCOUNTER — Telehealth: Payer: Self-pay

## 2018-02-05 NOTE — Progress Notes (Signed)
EPIC Encounter for ICM Monitoring  Patient Name: Darlene Pierce is a 52 y.o. female Date: 02/05/2018 Primary Care Physican: Vicenta Aly, [place] Primary Cardiologist: [place] Electrophysiologist: Allred Dry Weight:Previous weight222lbs  Bi-V Pacing: 97%       Attempted call to patient and unable to reach.  Left detailed message, per DPR, regarding transmission.  Transmission reviewed.    Thoracic impedance trending slightly below baseline.  Prescribed: Torsemide 20 mg 1 tablet twice a day. Spirolactone 25 mg take 0.5 tablet (12.'5mg'$  total) daily. Potassium 20 mEq Take 1 tablet (20 mEq total) by mouth 2 (two) times daily.  Labs: 10/26/2018Creatinine 0.91, BUN 14, Potassium 3.0, Sodium 139, EGFR >60 03/06/2017 Creatinine 1.00, BUN 15, Potassium 3.4, Sodium 137, EGFR >60  Recommendations:  Left voice mail with ICM number and encouraged to call if experiencing any fluid symptoms.  Follow-up plan: ICM clinic phone appointment on 02/28/2018.   Recall for Dr Rayann Heman 03/2018.  Was due for echo and appt with HF clinic in April 2019.    Copy of ICM check sent to Dr. Rayann Heman and Dr Haroldine Laws.   3 month ICM trend: 02/04/2018    1 Year ICM trend:       [name], RN 02/05/2018 3:57 PM

## 2018-02-05 NOTE — Telephone Encounter (Signed)
Remote ICM transmission received.  Attempted call to patient and left detailed message, per DPR, regarding transmission and next ICM scheduled for 02/28/2018.  Advised to return call for any fluid symptoms or questions.    

## 2018-02-19 ENCOUNTER — Other Ambulatory Visit (HOSPITAL_COMMUNITY): Payer: Self-pay | Admitting: Internal Medicine

## 2018-02-19 LAB — CUP PACEART REMOTE DEVICE CHECK
Battery Remaining Longevity: 36 mo
Battery Remaining Percentage: 41 %
Brady Statistic AP VP Percent: 3.5 %
Brady Statistic AS VS Percent: 2.4 %
Date Time Interrogation Session: 20190909165633
HIGH POWER IMPEDANCE MEASURED VALUE: 69 Ohm
HighPow Impedance: 69 Ohm
Implantable Lead Implant Date: 20150715
Implantable Lead Implant Date: 20150715
Implantable Lead Location: 753858
Implantable Pulse Generator Implant Date: 20150715
Lead Channel Impedance Value: 390 Ohm
Lead Channel Impedance Value: 440 Ohm
Lead Channel Pacing Threshold Pulse Width: 0.4 ms
Lead Channel Pacing Threshold Pulse Width: 0.4 ms
Lead Channel Pacing Threshold Pulse Width: 0.4 ms
Lead Channel Sensing Intrinsic Amplitude: 4.7 mV
Lead Channel Setting Pacing Amplitude: 2 V
Lead Channel Setting Pacing Amplitude: 2 V
Lead Channel Setting Pacing Amplitude: 2 V
Lead Channel Setting Pacing Pulse Width: 0.4 ms
Lead Channel Setting Sensing Sensitivity: 0.4 mV
MDC IDC LEAD IMPLANT DT: 20150715
MDC IDC LEAD LOCATION: 753859
MDC IDC LEAD LOCATION: 753860
MDC IDC MSMT BATTERY VOLTAGE: 2.92 V
MDC IDC MSMT LEADCHNL LV IMPEDANCE VALUE: 550 Ohm
MDC IDC MSMT LEADCHNL LV PACING THRESHOLD AMPLITUDE: 0.875 V
MDC IDC MSMT LEADCHNL RA PACING THRESHOLD AMPLITUDE: 0.5 V
MDC IDC MSMT LEADCHNL RV PACING THRESHOLD AMPLITUDE: 0.875 V
MDC IDC MSMT LEADCHNL RV SENSING INTR AMPL: 11.7 mV
MDC IDC SET LEADCHNL LV PACING PULSEWIDTH: 0.4 ms
MDC IDC STAT BRADY AP VS PERCENT: 1 %
MDC IDC STAT BRADY AS VP PERCENT: 94 %
MDC IDC STAT BRADY RA PERCENT PACED: 3.6 %
Pulse Gen Serial Number: 7198577

## 2018-02-28 ENCOUNTER — Ambulatory Visit (INDEPENDENT_AMBULATORY_CARE_PROVIDER_SITE_OTHER): Payer: 59

## 2018-02-28 DIAGNOSIS — Z95810 Presence of automatic (implantable) cardiac defibrillator: Secondary | ICD-10-CM

## 2018-02-28 DIAGNOSIS — I5042 Chronic combined systolic (congestive) and diastolic (congestive) heart failure: Secondary | ICD-10-CM

## 2018-03-01 NOTE — Progress Notes (Signed)
EPIC Encounter for ICM Monitoring  Patient Name: Darlene Pierce is a 52 y.o. female Date: 03/01/2018 Primary Care Physican: Aydt, Melanie, PA Primary Cardiologist: Bensimhon Electrophysiologist: Allred Dry Weight:Previous weight222lbs  Bi-V Pacing: 97%             Heart Failure questions reviewed, pt asymptomatic.  Patient did not take Torsemide during decreased impedance.  She is currently without insurance and will be Jan 1 before she has new insurance coverage.  She is not able to afford entresto or Torsemide at this time.  Message sent to HF clinic regarding medications.     Thoracic impedance normal.   Prescribed: Torsemide 20 mg 1 tablet twice a day. Spironolactone 25 mg take 0.5 tablet (12.5mg total) daily. Potassium 20 mEqTake 1 tablet (20 mEq total) by mouth 2 (two) times daily.  Labs: 10/26/2018Creatinine 0.91, BUN 14, Potassium 3.0, Sodium 139, EGFR >60 03/06/2017 Creatinine 1.00, BUN 15, Potassium 3.4, Sodium 137, EGFR >60  Recommendations:  No changes.  Encouraged to call for fluid symptoms.  Follow-up plan: ICM clinic phone appointment on 04/01/2018.  She will make physician appts after new insurance coverage starts 05/22/2018.  Recall for Dr Allred 03/2018. Was due for echo and appt with HF clinic in April 2019  Copy of ICM check sent to Dr. Allred.   3 month ICM trend: 02/28/2018    1 Year ICM trend:       Darlene S Short, RN 03/01/2018 1:32 PM   

## 2018-03-19 ENCOUNTER — Emergency Department (HOSPITAL_BASED_OUTPATIENT_CLINIC_OR_DEPARTMENT_OTHER): Payer: Self-pay

## 2018-03-19 ENCOUNTER — Emergency Department (HOSPITAL_BASED_OUTPATIENT_CLINIC_OR_DEPARTMENT_OTHER)
Admission: EM | Admit: 2018-03-19 | Discharge: 2018-03-19 | Disposition: A | Discharge status: Home / Self Care | Payer: Self-pay | Attending: Emergency Medicine | Admitting: Emergency Medicine

## 2018-03-19 ENCOUNTER — Other Ambulatory Visit: Payer: Self-pay

## 2018-03-19 ENCOUNTER — Encounter (HOSPITAL_BASED_OUTPATIENT_CLINIC_OR_DEPARTMENT_OTHER): Payer: Self-pay

## 2018-03-19 DIAGNOSIS — S29011A Strain of muscle and tendon of front wall of thorax, initial encounter: Secondary | ICD-10-CM | POA: Insufficient documentation

## 2018-03-19 DIAGNOSIS — Y929 Unspecified place or not applicable: Secondary | ICD-10-CM | POA: Insufficient documentation

## 2018-03-19 DIAGNOSIS — Z9049 Acquired absence of other specified parts of digestive tract: Secondary | ICD-10-CM | POA: Insufficient documentation

## 2018-03-19 DIAGNOSIS — I110 Hypertensive heart disease with heart failure: Secondary | ICD-10-CM | POA: Insufficient documentation

## 2018-03-19 DIAGNOSIS — X58XXXA Exposure to other specified factors, initial encounter: Secondary | ICD-10-CM | POA: Insufficient documentation

## 2018-03-19 DIAGNOSIS — Y9389 Activity, other specified: Secondary | ICD-10-CM | POA: Insufficient documentation

## 2018-03-19 DIAGNOSIS — Z8572 Personal history of non-Hodgkin lymphomas: Secondary | ICD-10-CM | POA: Insufficient documentation

## 2018-03-19 DIAGNOSIS — S161XXA Strain of muscle, fascia and tendon at neck level, initial encounter: Secondary | ICD-10-CM | POA: Insufficient documentation

## 2018-03-19 DIAGNOSIS — Z79899 Other long term (current) drug therapy: Secondary | ICD-10-CM | POA: Insufficient documentation

## 2018-03-19 DIAGNOSIS — I5040 Unspecified combined systolic (congestive) and diastolic (congestive) heart failure: Secondary | ICD-10-CM | POA: Insufficient documentation

## 2018-03-19 DIAGNOSIS — R0789 Other chest pain: Secondary | ICD-10-CM

## 2018-03-19 DIAGNOSIS — Z95810 Presence of automatic (implantable) cardiac defibrillator: Secondary | ICD-10-CM | POA: Insufficient documentation

## 2018-03-19 DIAGNOSIS — Y998 Other external cause status: Secondary | ICD-10-CM | POA: Insufficient documentation

## 2018-03-19 HISTORY — DX: Diaphragmatic hernia without obstruction or gangrene: K44.9

## 2018-03-19 LAB — TROPONIN I: Troponin I: <0.03 ng/mL (ref <0.03)

## 2018-03-19 LAB — CBC
HEMATOCRIT: 40.2 % (ref 36.0–46.0)
HEMOGLOBIN: 12.6 g/dL (ref 12.0–15.0)
MCH: 27.9 pg (ref 26.0–34.0)
MCHC: 31.3 g/dL (ref 30.0–36.0)
MCV: 88.9 fL (ref 80.0–100.0)
Platelets: 305 10*3/uL (ref 150–400)
RBC: 4.52 MIL/uL (ref 3.87–5.11)
RDW: 12.6 % (ref 11.5–15.5)
WBC: 5.3 10*3/uL (ref 4.0–10.5)
nRBC: 0 % (ref 0.0–0.2)

## 2018-03-19 LAB — BRAIN NATRIURETIC PEPTIDE: B NATRIURETIC PEPTIDE 5: 122.7 pg/mL — AB (ref 0.0–100.0)

## 2018-03-19 LAB — BASIC METABOLIC PANEL
Anion gap: 5 (ref 5–15)
BUN: 14 mg/dL (ref 6–20)
CO2: 31 mmol/L (ref 22–32)
Calcium: 8.9 mg/dL (ref 8.9–10.3)
Chloride: 103 mmol/L (ref 98–111)
Creatinine, Ser: 0.73 mg/dL (ref 0.44–1.00)
GFR calc Af Amer: >60 mL/min (ref >60)
GFR calc non Af Amer: >60 mL/min (ref >60)
Glucose, Bld: 103 mg/dL — ABNORMAL HIGH (ref 70–99)
POTASSIUM: 3.5 mmol/L (ref 3.5–5.1)
SODIUM: 139 mmol/L (ref 135–145)

## 2018-03-19 LAB — PREGNANCY, URINE: PREG TEST UR: NEGATIVE

## 2018-03-19 NOTE — ED Triage Notes (Signed)
C/o CP x 3 days-NAD-steady gait

## 2018-03-19 NOTE — Discharge Instructions (Addendum)
You have been diagnosed by your caregiver as having chest wall pain. °SEEK IMMEDIATE MEDICAL ATTENTION IF: °You develop a fever.  °Your chest pains become severe or intolerable.  °You develop new, unexplained symptoms (problems).  °You develop shortness of breath, nausea, vomiting, sweating or feel light headed.  °You develop a new cough or you cough up blood. ° °

## 2018-03-19 NOTE — ED Provider Notes (Signed)
[facility] EMERGENCY DEPARTMENT Provider Note   CSN: [id number] Arrival date & time: 03/19/18  1301     History   Chief Complaint Chief Complaint  Patient presents with  . Chest Pain    HPI Darlene Pierce is a 52 y.o. female.  Extensive past medical history including CHF combined diastolic and systolic with an EF in the 40s, history of non-Hodgkin's lymphoma status post chemoradiation she is followed by Dr. Haroldine Laws and has an ICD in place.  Patient has had 3 days of pain in her mid thoracic region between her shoulder blades radiating up to her occipital area she also has pain bilaterally in the upper pectoralis region.  It feels intermittent and spasmodic.  She states that when it occurs she feels like she has some chest tightness and feels that she cannot breath.  The patient states that her CHF has been rather well controlled by upping her dose of torsemide however due to insurance issues she is only been taking half of her dose of Entresto and she is not sure if that may have caused her to have some volume overload.  She did have an episode yesterday where she felt somewhat lightheaded as if she was going to pass out when she was standing up that improved as she sat down and only lasted for a few seconds.  She denies chest pain palpitations or shortness of breath at the time.  HPI  Past Medical History:  Diagnosis Date  . CHF (congestive heart failure) ([place])   . H/O angiography    unsure of any PCI  . Hiatal hernia   . Hypertension   . Lymphoma ([place])    s/p chemotherapy 2012, radiation in 2000  . Multiple thyroid nodules   . Non-ischemic cardiomyopathy ([place])   . OSA (obstructive sleep apnea)    a. mild by sleep study 08/2013 - trial of weight loss;  refer to sleep med if unsucessful  . Respiratory failure ([place])    [facility] and was on [facility]ator    Patient Active Problem List   Diagnosis Date Noted  . Right knee pain 10/23/2017  .  Gastroesophageal reflux disease 12/18/2016  . Cardiogenic shock ([place]) 09/22/2013  . Acute renal failure ([place]) 09/22/2013  . Renal insufficiency 09/21/2013  . Acute on chronic systolic and diastolic heart failure, NYHA class 4 ([place]) 09/17/2013  . OSA (obstructive sleep apnea) 09/04/2013  . NICM (nonischemic cardiomyopathy) ([place]) 06/12/2013  . Hypertension   . Multiple thyroid nodules   . Non-Hodgkin lymphoma ([place]) 11/11/2012  . Chronic neck pain 11/11/2012    Past Surgical History:  Procedure Laterality Date  . BI-VENTRICULAR IMPLANTABLE CARDIOVERTER DEFIBRILLATOR  (CRT-D) Right 12/03/2013   SJM Dorma Russell Assura implanted in [place] for primary prevention  . CHOLECYSTECTOMY    . FEMUR SURGERY    . KNEE SURGERY    . porta cath insertion     2 years  . RIGHT HEART CATHETERIZATION N/A 09/19/2013   Procedure: RIGHT HEART CATH;  Surgeon: Jolaine Artist, MD;  Location: [facility] CATH LAB;  Service: Cardiovascular;  Laterality: N/A;  . TUBAL LIGATION       OB History   None      Home Medications    Prior to Admission medications   Medication Sig Start Date End Date Taking? Authorizing Provider  carvedilol (COREG) 25 MG tablet TAKE 1 TABLET BY MOUTH TWICE DAILY WITH A MEAL 02/19/18   Bensimhon, Shaune Pascal, MD  Cholecalciferol (  VITAMIN D) 2000 UNITS tablet Take 2,000 Units by mouth daily.    [provider]  ENTRESTO 97-103 MG TAKE 1 TABLET BY MOUTH TWICE DAILY 02/19/18   Bensimhon, Shaune Pascal, MD  gabapentin (NEURONTIN) 300 MG capsule Take by mouth. 05/05/08   [provider]  HYDROcodone-acetaminophen (NORCO/VICODIN) 5-325 MG tablet Take 1-2 tablets by mouth every 6 (six) hours as needed. 10/16/17   Davonna Belling, MD  Lorcaserin HCl ER (BELVIQ XR) 20 MG TB24 Take by mouth. 12/11/16   [provider]  Multiple Vitamin (THERA) TABS Take by mouth.    [provider]  omeprazole (PRILOSEC) 40 MG capsule Take by mouth. 11/15/16   [provider]    potassium chloride SA (K-DUR,KLOR-CON) 20 MEQ tablet Take 1 tablet (20 mEq total) by mouth 2 (two) times daily. 03/16/17   Arbutus Leas, NP  spironolactone (ALDACTONE) 25 MG tablet Take 0.5 tablets (12.5 mg total) by mouth at bedtime. 03/16/17 06/14/17  Bensimhon, Shaune Pascal, MD  torsemide (DEMADEX) 20 MG tablet TAKE 1 TABLET(20 MG) BY MOUTH TWICE DAILY 02/19/18   Bensimhon, Shaune Pascal, MD    Family History Family History  Problem Relation Age of Onset  . CAD Mother     Social History Social History   Tobacco Use  . Smoking status: Never Smoker  . Smokeless tobacco: Never Used  Substance Use Topics  . Alcohol use: Yes    Comment: occ  . Drug use: No     Allergies   Patient has no known allergies.   Review of Systems Review of Systems  Ten systems reviewed and are negative for acute change, except as noted in the HPI.   Physical Exam Updated Vital Signs BP (!) 149/104 (BP Location: Left Arm)   Pulse 96   Temp 98.4 F (36.9 C) (Oral)   Resp 20   Ht 5\' 6"  (1.676 m)   Wt 101.2 kg   SpO2 99%   BMI 35.99 kg/m   Physical Exam  Constitutional: She is oriented to person, place, and time. She appears well-developed and well-nourished. No distress.  HENT:  Head: Normocephalic and atraumatic.  Eyes: Pupils are equal, round, and reactive to light. Conjunctivae and EOM are normal. No scleral icterus.  Neck: Normal range of motion.  Cardiovascular: Normal rate, regular rhythm and normal heart sounds. Exam reveals no gallop and no friction rub.  No murmur heard. Pulmonary/Chest: Effort normal and breath sounds normal. No respiratory distress.  Abdominal: Soft. Bowel sounds are normal. She exhibits no distension and no mass. There is no tenderness. There is no guarding.  Musculoskeletal:       Right lower leg: She exhibits no edema.       Left lower leg: She exhibits no edema.  Tender palpation in the lower fibers of the trapezius muscle.  Tight and forward rounded pectoralis  muscles which are also looks extremely tender especially along the insertion sites of the sternum.  Neurological: She is alert and oriented to person, place, and time.  Skin: Skin is warm and dry. She is not diaphoretic.  Psychiatric: Her behavior is normal.  Nursing note and vitals reviewed.    ED Treatments / Results  Labs (all labs ordered are listed, but only abnormal results are displayed) Labs Reviewed  PREGNANCY, URINE  BASIC METABOLIC PANEL  CBC  TROPONIN I    EKG EKG Interpretation  Date/Time:  Tuesday March 19 2018 13:09:48 EDT Ventricular Rate:  94 PR Interval:  148 QRS  Duration: 134 QT Interval:  412 QTC Calculation: 515 R Axis:   -8 Text Interpretation:  Atrial-sensed ventricular-paced rhythm Biventricular pacemaker detected Abnormal ECG No significant change since last tracing Confirmed by Deno Etienne [phone]) on 03/19/2018 1:54:50 PM   Radiology Dg Chest 2 View  Result Date: 03/19/2018 CLINICAL DATA:  Chest pain for 2 days EXAM: CHEST - 2 VIEW COMPARISON:  09/17/2013 FINDINGS: Cardiac shadow is stable. Defibrillator is again noted. Left chest wall port is seen with deformity at the vein entry site without lumen compromise. The lungs are well aerated without focal infiltrate or sizable effusion. Degenerative changes of the thoracic spine are noted. IMPRESSION: No acute abnormality noted. Electronically Signed   By: Inez Catalina M.D.   On: 03/19/2018 13:27    Procedures Procedures (including critical care time)  Medications Ordered in ED Medications - No data to display   Initial Impression / Assessment and Plan / ED Course  I have reviewed the triage vital signs and the nursing notes.  Pertinent labs & imaging results that were available during my care of the patient were reviewed by me and considered in my medical decision making (see chart for details).     Patient with a long-standing history of cardiac disease.  Her pain is reproducible.  She has  stable vital signs normal oxygen saturation.  Her BNP is 122.7 she has no rails, orthopnea or PND.  She has close monitoring with heart failure clinic.  Patient's had close monitoring by the company who recently just interrogated her defibrillator.  Showed no abnormalities.  She does not appear to be in a volume overload she is having no active chest pain at this time.  She has reproducible chest pain with movement of the shoulders.  This all appears to be musculoskeletal chest wall pain today.  Have asked the patient to follow tomorrow with the heart failure clinic for any further evaluation.  She appears appropriate for discharge at this time.  I discussed the case with Dr. Rex Kras who agrees.  I personally reviewed the patient's PA lateral chest film which shows no significant or acute abnormalities today.  Final Clinical Impressions(s) / ED Diagnoses   Final diagnoses:  Strain of cervical portion of both trapezius muscles  Pectoralis muscle strain, initial encounter  Chest wall pain    ED Discharge Orders    None       Margarita Mail, PA-C 03/19/18 2014    Little, Wenda Overland, MD 03/19/18 2357

## 2018-03-29 DIAGNOSIS — E669 Obesity, unspecified: Secondary | ICD-10-CM | POA: Insufficient documentation

## 2018-03-29 DIAGNOSIS — J189 Pneumonia, unspecified organism: Secondary | ICD-10-CM | POA: Insufficient documentation

## 2018-04-01 ENCOUNTER — Telehealth: Payer: Self-pay | Admitting: Cardiology

## 2018-04-01 ENCOUNTER — Ambulatory Visit (INDEPENDENT_AMBULATORY_CARE_PROVIDER_SITE_OTHER): Payer: Self-pay

## 2018-04-01 DIAGNOSIS — Z95810 Presence of automatic (implantable) cardiac defibrillator: Secondary | ICD-10-CM

## 2018-04-01 DIAGNOSIS — I5042 Chronic combined systolic (congestive) and diastolic (congestive) heart failure: Secondary | ICD-10-CM

## 2018-04-01 MED ORDER — CARVEDILOL 25 MG PO TABS
25.00 | ORAL_TABLET | ORAL | Status: DC
Start: 2018-03-31 — End: 2018-04-01

## 2018-04-01 MED ORDER — SODIUM CHLORIDE 0.9 % IV SOLN
10.00 | INTRAVENOUS | Status: DC
Start: ? — End: 2018-04-01

## 2018-04-01 MED ORDER — DOXYCYCLINE MONOHYDRATE 100 MG PO CAPS
100.00 | ORAL_CAPSULE | ORAL | Status: DC
Start: 2018-03-31 — End: 2018-04-01

## 2018-04-01 MED ORDER — SACUBITRIL-VALSARTAN 97-103 MG PO TABS
1.00 | ORAL_TABLET | ORAL | Status: DC
Start: 2018-03-31 — End: 2018-04-01

## 2018-04-01 MED ORDER — PANTOPRAZOLE SODIUM 40 MG PO TBEC
40.00 | DELAYED_RELEASE_TABLET | ORAL | Status: DC
Start: 2018-04-01 — End: 2018-04-01

## 2018-04-01 MED ORDER — GENERIC EXTERNAL MEDICATION
30.00 | Status: DC
Start: 2018-04-01 — End: 2018-04-01

## 2018-04-01 MED ORDER — IOPAMIDOL (ISOVUE-370) INJECTION 76%
100.00 | INTRAVENOUS | Status: DC
Start: ? — End: 2018-04-01

## 2018-04-01 MED ORDER — TORSEMIDE 20 MG PO TABS
20.00 | ORAL_TABLET | ORAL | Status: DC
Start: 2018-04-01 — End: 2018-04-01

## 2018-04-01 MED ORDER — ALBUTEROL SULFATE (2.5 MG/3ML) 0.083% IN NEBU
2.50 | INHALATION_SOLUTION | RESPIRATORY_TRACT | Status: DC
Start: ? — End: 2018-04-01

## 2018-04-01 MED ORDER — GENERIC EXTERNAL MEDICATION
2.00 | Status: DC
Start: 2018-04-01 — End: 2018-04-01

## 2018-04-01 MED ORDER — ENOXAPARIN SODIUM 40 MG/0.4ML ~~LOC~~ SOLN
40.00 | SUBCUTANEOUS | Status: DC
Start: 2018-04-01 — End: 2018-04-01

## 2018-04-01 MED ORDER — GUAIFENESIN 400 MG PO TABS
400.00 | ORAL_TABLET | ORAL | Status: DC
Start: ? — End: 2018-04-01

## 2018-04-01 MED ORDER — GENERIC EXTERNAL MEDICATION
4.00 | Status: DC
Start: ? — End: 2018-04-01

## 2018-04-01 MED ORDER — GENERIC EXTERNAL MEDICATION
650.00 | Status: DC
Start: ? — End: 2018-04-01

## 2018-04-01 MED ORDER — GENERIC EXTERNAL MEDICATION
10.00 | Status: DC
Start: ? — End: 2018-04-01

## 2018-04-01 NOTE — Telephone Encounter (Signed)
LMOVM reminding pt to send remote transmission.   

## 2018-04-02 NOTE — Progress Notes (Signed)
EPIC Encounter for ICM Monitoring  Patient Name: Darlene Pierce is a 52 y.o. female Date: 04/02/2018 Primary Care Physican: Vicenta Aly, [place] Primary Cardiologist: [place] Electrophysiologist: Allred Dry Weight:Previous weight222lbs  Bi-V Pacing: 97%       Heart Failure questions reviewed, pt  symptomatic with resp infection. She is being treated for pneumonia and was in ER on 03/29/2018.  Decreased impedance correlates with pnuemonia.   Thoracic impedance normal today.   Prescribed: Torsemide 20 mg 1 tablet twice a day. Spironolactone 25 mg take 0.5 tablet (12.'5mg'$  total) daily. Potassium 20 mEqTake 1 tablet (20 mEq total) by mouth 2 (two) times daily.  Labs: 10/26/2018Creatinine 0.91, BUN 14, Potassium 3.0, Sodium 139, EGFR >60 03/06/2017 Creatinine 1.00, BUN 15, Potassium 3.4, Sodium 137, EGFR >60  Recommendations:  Patient's medical and drug insurance plan was canceled earlier than she expected. She has been out of Entresto for about 3 weeks and has left message with Dr Bensimhon's office. She cannot afford the Entresto without insurance and wanted to know if there is something else less expensive she can take.    Advised I will send a message regarding Entresto.  She said she will have insurance and drug coverage starting 05/22/2018.    Follow-up plan: ICM clinic phone appointment on 05/23/2018.  She will schedule appt with Dr Haroldine Laws after January 1 when new insurance coverage starts.     Copy of ICM check sent to Dr. Rayann Heman and Dr Haroldine Laws.   Message sent to HF clinic regarding Entresto.   3 month ICM trend: 04/02/2018    1 Year ICM trend:       [name], RN 04/02/2018 4:07 PM

## 2018-04-03 ENCOUNTER — Telehealth (HOSPITAL_COMMUNITY): Payer: Self-pay | Admitting: Pharmacist

## 2018-04-03 NOTE — Telephone Encounter (Signed)
Darlene Pierce just lost her insurance and is not able to afford the copay for Entresto. Will send her the [employer] patient assistance application and provide her with samples as needed.   [name]. [name], PharmD, BCPS, CPP Clinical Pharmacist Phone: [phone] 04/03/2018 11:52 AM

## 2018-05-23 ENCOUNTER — Ambulatory Visit (INDEPENDENT_AMBULATORY_CARE_PROVIDER_SITE_OTHER): Payer: Self-pay

## 2018-05-23 DIAGNOSIS — I428 Other cardiomyopathies: Secondary | ICD-10-CM

## 2018-05-23 DIAGNOSIS — Z95810 Presence of automatic (implantable) cardiac defibrillator: Secondary | ICD-10-CM

## 2018-05-23 DIAGNOSIS — I5042 Chronic combined systolic (congestive) and diastolic (congestive) heart failure: Secondary | ICD-10-CM

## 2018-05-24 ENCOUNTER — Encounter: Payer: Self-pay | Admitting: Cardiology

## 2018-05-24 LAB — CUP PACEART REMOTE DEVICE CHECK
Battery Remaining Percentage: 37 %
Battery Voltage: 2.9 V
Brady Statistic AS VS Percent: 2.7 %
Brady Statistic RA Percent Paced: 3.2 %
HIGH POWER IMPEDANCE MEASURED VALUE: 70 Ohm
HighPow Impedance: 70 Ohm
Implantable Lead Implant Date: 20150715
Implantable Lead Implant Date: 20150715
Implantable Lead Location: 753860
Implantable Pulse Generator Implant Date: 20150715
Lead Channel Impedance Value: 390 Ohm
Lead Channel Impedance Value: 540 Ohm
Lead Channel Pacing Threshold Amplitude: 0.5 V
Lead Channel Pacing Threshold Amplitude: 0.625 V
Lead Channel Pacing Threshold Pulse Width: 0.4 ms
Lead Channel Sensing Intrinsic Amplitude: 11.7 mV
Lead Channel Setting Pacing Amplitude: 2 V
Lead Channel Setting Pacing Amplitude: 2 V
Lead Channel Setting Pacing Pulse Width: 0.4 ms
Lead Channel Setting Sensing Sensitivity: 0.4 mV
MDC IDC LEAD IMPLANT DT: 20150715
MDC IDC LEAD LOCATION: 753858
MDC IDC LEAD LOCATION: 753859
MDC IDC MSMT BATTERY REMAINING LONGEVITY: 32 mo
MDC IDC MSMT LEADCHNL LV PACING THRESHOLD PULSEWIDTH: 0.4 ms
MDC IDC MSMT LEADCHNL RA IMPEDANCE VALUE: 430 Ohm
MDC IDC MSMT LEADCHNL RA SENSING INTR AMPL: 3.1 mV
MDC IDC MSMT LEADCHNL RV PACING THRESHOLD AMPLITUDE: 0.875 V
MDC IDC MSMT LEADCHNL RV PACING THRESHOLD PULSEWIDTH: 0.4 ms
MDC IDC PG SERIAL: 7198577
MDC IDC SESS DTM: 20200103170245
MDC IDC SET LEADCHNL RV PACING AMPLITUDE: 2 V
MDC IDC SET LEADCHNL RV PACING PULSEWIDTH: 0.4 ms
MDC IDC STAT BRADY AP VP PERCENT: 3.1 %
MDC IDC STAT BRADY AP VS PERCENT: 1 %
MDC IDC STAT BRADY AS VP PERCENT: 94 %

## 2018-05-24 NOTE — Progress Notes (Signed)
EPIC Encounter for ICM Monitoring  Patient Name: Darlene Pierce is a 53 y.o. female Date: 05/24/2018 Primary Care Physican: Vicenta Aly, [place] Primary Cardiologist: [place] Electrophysiologist: Allred Last weight225lbs  Bi-V Pacing: 97%                                                          Heart Failure questions reviewed, pt asymptomatic.   Thoracic impedance normal today.   Prescribed: Torsemide 20 mg 1 tablet twice a day. Spironolactone 25 mg take 0.5 tablet (12.39m total) daily. Potassium 20 mEqTake 1 tablet (20 mEq total) by mouth 2 (two) times daily.  Labs: 10/26/2018Creatinine 0.91, BUN 14, Potassium 3.0, Sodium 139, EGFR >60 03/06/2017 Creatinine 1.00, BUN 15, Potassium 3.4, Sodium 137, EGFR >60  Recommendations:  No changes.    Follow-up plan: ICM clinic phone appointment on 06/24/2018.      Copy of ICM check sent to Dr. ARayann Heman  3 month ICM trend: 05/23/2018    1 Year ICM trend:       Darlene Billings RN 05/24/2018 3:20 PM

## 2018-05-24 NOTE — Progress Notes (Signed)
Remote ICD transmission.   

## 2018-07-01 NOTE — Progress Notes (Signed)
No ICM remote transmission received for 06/24/2018 and next ICM transmission scheduled for 07/10/2018.

## 2018-07-10 ENCOUNTER — Ambulatory Visit (INDEPENDENT_AMBULATORY_CARE_PROVIDER_SITE_OTHER): Payer: Self-pay

## 2018-07-10 DIAGNOSIS — Z95810 Presence of automatic (implantable) cardiac defibrillator: Secondary | ICD-10-CM

## 2018-07-10 DIAGNOSIS — I5042 Chronic combined systolic (congestive) and diastolic (congestive) heart failure: Secondary | ICD-10-CM

## 2018-07-11 ENCOUNTER — Telehealth: Payer: Self-pay

## 2018-07-11 NOTE — Telephone Encounter (Signed)
Left message for patient to remind of missed remote transmission.  

## 2018-07-12 NOTE — Progress Notes (Signed)
EPIC Encounter for ICM Monitoring  Patient Name: Darlene Pierce is a 53 y.o. female Date: 07/12/2018 Primary Care Physican: Vicenta Aly, [place] Primary Cardiologist: [place] Electrophysiologist: Allred Last weight225lbs  Bi-V Pacing: 97%  Heart Failure questions reviewed, pt asymptomatic. She said she could tell she had fluid during decreased impedance and symptoms resolved by doubling Torsemide for a few days.   Thoracic impedance normalbut was abnormal suggesting fluid accumulation 06/24/2018 - 07/05/2018.   Prescribed:Torsemide 20 mg 1 tablet twice a day. Spironolactone 25 mg take 0.5 tablet (12.5mg  total) daily. Potassium 20 mEqTake 1 tablet (20 mEq total) by mouth 2 (two) times daily.  Labs: 03/19/2018 Creatinine 0.73, BUN 14, Potassium 3.5, Sodium 139, GFR >60 10/26/2018Creatinine 0.91, BUN 14, Potassium 3.0, Sodium 139, GFR >60 03/06/2017 Creatinine 1.00, BUN 15, Potassium 3.4, Sodium 137, GFR >60  Recommendations:No changes and encouraged to call for fluid symptoms.   Follow-up plan: ICM clinic phone appointment 08/13/2018.  Copy of ICM check sent to Dr.Allred.  3 month ICM trend: 07/12/2018    1 Year ICM trend:       [name], RN 07/12/2018 2:31 PM

## 2018-08-02 ENCOUNTER — Other Ambulatory Visit (HOSPITAL_COMMUNITY): Payer: Self-pay | Admitting: Internal Medicine

## 2018-08-13 ENCOUNTER — Ambulatory Visit (INDEPENDENT_AMBULATORY_CARE_PROVIDER_SITE_OTHER): Payer: Self-pay

## 2018-08-13 ENCOUNTER — Telehealth: Payer: Self-pay

## 2018-08-13 ENCOUNTER — Other Ambulatory Visit: Payer: Self-pay

## 2018-08-13 DIAGNOSIS — Z95810 Presence of automatic (implantable) cardiac defibrillator: Secondary | ICD-10-CM

## 2018-08-13 DIAGNOSIS — I5042 Chronic combined systolic (congestive) and diastolic (congestive) heart failure: Secondary | ICD-10-CM

## 2018-08-13 NOTE — Telephone Encounter (Signed)
Spoke with patient to remind of missed remote transmission 

## 2018-08-16 ENCOUNTER — Telehealth: Payer: Self-pay

## 2018-08-16 NOTE — Progress Notes (Signed)
EPIC Encounter for ICM Monitoring  Patient Name: Khaleelah Yowell is a 53 y.o. female Date: 08/16/2018 Primary Care Physican: Vicenta Aly, [place] Primary Cardiologist: [place] Electrophysiologist: Allred Bi-V Pacing: 97% Lastweight225lbs   Attempted call to patient and unable to reach.  Left message to return call per DPR regarding transmission. Transmission reviewed.   Thoracic impedance abnormal suggesting fluid accumulation since 08/06/2018.   Prescribed:Torsemide 20 mg 1 tablet twice a day. Spironolactone 25 mg take 0.5 tablet (12.5mg  total) daily. Potassium 20 mEqTake 1 tablet (20 mEq total) by mouth 2 (two) times daily.  Labs: 03/19/2018 Creatinine 0.73, BUN 14, Potassium 3.5, Sodium 139, GFR >60 10/26/2018Creatinine 0.91, BUN 14, Potassium 3.0, Sodium 139, GFR >60 03/06/2017 Creatinine 1.00, BUN 15, Potassium 3.4, Sodium 137, GFR >60  Recommendations:Unable to reach.  If patient is reached, will advise to take extra Torsemide per protocol.   Follow-up plan: ICM clinic phone appointment 08/22/2018 to recheck fluid levels.  Copy of ICM check sent to Dr.Allred and Dr Haroldine Laws.  3 month ICM trend: 08/13/2018    1 Year ICM trend:       [name], RN 08/16/2018 5:56 PM

## 2018-08-16 NOTE — Telephone Encounter (Signed)
Remote ICM transmission received.  Attempted call to patient regarding ICM remote transmission and left detailed message, per DPR, to return call.    

## 2018-08-22 ENCOUNTER — Encounter: Payer: Self-pay | Admitting: *Deleted

## 2018-08-22 ENCOUNTER — Other Ambulatory Visit: Payer: Self-pay

## 2018-08-23 ENCOUNTER — Telehealth: Payer: Self-pay

## 2018-08-23 NOTE — Telephone Encounter (Signed)
Left message for patient to remind of missed remote transmission.  

## 2018-08-26 ENCOUNTER — Ambulatory Visit (INDEPENDENT_AMBULATORY_CARE_PROVIDER_SITE_OTHER): Payer: Self-pay

## 2018-08-26 ENCOUNTER — Other Ambulatory Visit: Payer: Self-pay

## 2018-08-26 DIAGNOSIS — Z95810 Presence of automatic (implantable) cardiac defibrillator: Secondary | ICD-10-CM

## 2018-08-26 DIAGNOSIS — I5042 Chronic combined systolic (congestive) and diastolic (congestive) heart failure: Secondary | ICD-10-CM

## 2018-08-26 NOTE — Progress Notes (Signed)
EPIC Encounter for ICM Monitoring  Patient Name: Darlene Pierce is a 53 y.o. female Date: 08/26/2018 Primary Care Physican: Vicenta Aly, [place] Primary Cardiologist: [place] Electrophysiologist: Allred Bi-V Pacing: 97% Lastweight225lbs  08/26/2018 Weight: 221 - 225 lbs  Spoke with patient and she is asymptomatic for fluid accumulation.  She reports no symptoms during decreased impedance and thinks she may have been eating more salt in her diet. .  Thoracic impedance normal but was abnormal suggesting fluid accumulation from 08/07/2018 to 08/14/2018.   Prescribed:Torsemide 20 mg 1 tablet twice a day. Spironolactone 25 mg take 0.5 tablet (12.5mg  total) daily. Potassium 20 mEqTake 1 tablet (20 mEq total) by mouth 2 (two) times daily.  Labs: 03/19/2018 Creatinine 0.73, BUN 14, Potassium 3.5, Sodium 139, GFR >60 10/26/2018Creatinine 0.91, BUN 14, Potassium 3.0, Sodium 139, GFR >60 03/06/2017 Creatinine 1.00, BUN 15, Potassium 3.4, Sodium 137, GFR >60  Recommendations:Encouraged to limit salt intake.  Follow-up plan: ICM clinic phone appointment5/08/2018.  Copy of ICM check sent to Dr.Allred  3 month ICM trend: 08/26/2018    1 Year ICM trend:       [name], RN 08/26/2018 2:38 PM

## 2018-08-27 ENCOUNTER — Other Ambulatory Visit: Payer: Self-pay

## 2018-08-27 ENCOUNTER — Ambulatory Visit (INDEPENDENT_AMBULATORY_CARE_PROVIDER_SITE_OTHER): Payer: Self-pay | Admitting: *Deleted

## 2018-08-27 DIAGNOSIS — I428 Other cardiomyopathies: Secondary | ICD-10-CM

## 2018-08-27 LAB — CUP PACEART REMOTE DEVICE CHECK
Battery Remaining Longevity: 30 mo
Battery Remaining Percentage: 35 %
Battery Voltage: 2.9 V
Brady Statistic AP VP Percent: 2.9 %
Brady Statistic AP VS Percent: 1 %
Brady Statistic AS VP Percent: 94 %
Brady Statistic AS VS Percent: 2.7 %
Brady Statistic RA Percent Paced: 3.1 %
Date Time Interrogation Session: 20200404234137
HighPow Impedance: 71 Ohm
HighPow Impedance: 71 Ohm
Implantable Lead Implant Date: 20150715
Implantable Lead Implant Date: 20150715
Implantable Lead Implant Date: 20150715
Implantable Lead Location: 753858
Implantable Lead Location: 753859
Implantable Lead Location: 753860
Implantable Pulse Generator Implant Date: 20150715
Lead Channel Impedance Value: 390 Ohm
Lead Channel Impedance Value: 450 Ohm
Lead Channel Impedance Value: 540 Ohm
Lead Channel Pacing Threshold Amplitude: 0.5 V
Lead Channel Pacing Threshold Amplitude: 0.625 V
Lead Channel Pacing Threshold Amplitude: 1 V
Lead Channel Pacing Threshold Pulse Width: 0.4 ms
Lead Channel Pacing Threshold Pulse Width: 0.4 ms
Lead Channel Pacing Threshold Pulse Width: 0.4 ms
Lead Channel Sensing Intrinsic Amplitude: 12 mV
Lead Channel Sensing Intrinsic Amplitude: 3.6 mV
Lead Channel Setting Pacing Amplitude: 2 V
Lead Channel Setting Pacing Amplitude: 2 V
Lead Channel Setting Pacing Amplitude: 2 V
Lead Channel Setting Pacing Pulse Width: 0.4 ms
Lead Channel Setting Pacing Pulse Width: 0.4 ms
Lead Channel Setting Sensing Sensitivity: 0.4 mV
Pulse Gen Serial Number: 7198577

## 2018-09-01 ENCOUNTER — Other Ambulatory Visit (HOSPITAL_COMMUNITY): Payer: Self-pay | Admitting: Internal Medicine

## 2018-09-02 ENCOUNTER — Telehealth (INDEPENDENT_AMBULATORY_CARE_PROVIDER_SITE_OTHER): Payer: Self-pay | Admitting: Internal Medicine

## 2018-09-02 VITALS — BP 121/76 | HR 68 | Wt 229.0 lb

## 2018-09-02 DIAGNOSIS — I110 Hypertensive heart disease with heart failure: Secondary | ICD-10-CM

## 2018-09-02 DIAGNOSIS — I10 Essential (primary) hypertension: Secondary | ICD-10-CM

## 2018-09-02 DIAGNOSIS — Z9884 Bariatric surgery status: Secondary | ICD-10-CM

## 2018-09-02 DIAGNOSIS — I5042 Chronic combined systolic (congestive) and diastolic (congestive) heart failure: Secondary | ICD-10-CM

## 2018-09-02 DIAGNOSIS — I428 Other cardiomyopathies: Secondary | ICD-10-CM

## 2018-09-02 NOTE — Progress Notes (Signed)
Electrophysiology TeleHealth Note   Due to national recommendations of social distancing due to COVID 19, an audio/video telehealth visit is felt to be most appropriate for this patient at this time.  See MyChart message from today for the patient's consent to telehealth for Darlene Pierce.   Date:  09/02/2018   ID:  Darlene Pierce, DOB 02-26-1966, MRN [id number]  Location: patient's home  Provider location: [address], [place]  Evaluation Performed: Follow-up visit  PCP:  Darlene Aly, PA  CHF:  [name] Electrophysiologist:  [name]  Chief Complaint:  CHF  History of Present Illness:    Darlene Pierce is a 53 y.o. female who presents via audio/video conferencing for a telehealth visit today.  Since last being seen in our clinic, the patient reports doing very well.  Today, she denies symptoms of palpitations, chest pain, shortness of breath,  lower extremity edema, dizziness, presyncope, or syncope.  The patient is otherwise without complaint today. She as GERD for which she is following with GI.  The patient denies symptoms of fevers, chills, cough, or new SOB worrisome for COVID 19.  Past Medical History:  Diagnosis Date   CHF (congestive heart failure) ([facility])    H/O angiography    unsure of any PCI   Hiatal hernia    Hypertension    Lymphoma ([place])    s/p chemotherapy 2012, radiation in 2000   Multiple thyroid nodules    Non-ischemic cardiomyopathy ([facility])    OSA (obstructive sleep apnea)    a. mild by sleep study 08/2013 - trial of weight loss;  refer to sleep med if unsucessful   Respiratory failure ([place])    [facility] and [facility]    Past Surgical History:  Procedure Laterality Date   BI-VENTRICULAR IMPLANTABLE CARDIOVERTER DEFIBRILLATOR  (CRT-D) Right 12/03/2013   SJM Dorma Russell Assura implanted in [place] for primary prevention   CHOLECYSTECTOMY     FEMUR SURGERY     KNEE SURGERY     porta cath  insertion     2 years   RIGHT HEART CATHETERIZATION N/A 09/19/2013   Procedure: RIGHT HEART CATH;  Surgeon: Jolaine Artist, MD;  Location: [facility] CATH LAB;  Service: Cardiovascular;  Laterality: N/A;   TUBAL LIGATION      Current Outpatient Medications  Medication Sig Dispense Refill   carvedilol (COREG) 25 MG tablet TAKE 1 TABLET BY MOUTH TWICE DAILY WITH A MEAL 60 tablet 2   Cholecalciferol (VITAMIN D) 2000 UNITS tablet Take 2,000 Units by mouth daily.     ENTRESTO 97-103 MG TAKE 1 TABLET BY MOUTH TWICE DAILY 60 tablet 2   gabapentin (NEURONTIN) 300 MG capsule Take by mouth.     HYDROcodone-acetaminophen (NORCO/VICODIN) 5-325 MG tablet Take 1-2 tablets by mouth every 6 (six) hours as needed. 6 tablet 0   Lorcaserin HCl ER (BELVIQ XR) 20 MG TB24 Take by mouth.     Multiple Vitamin (THERA) TABS Take by mouth.     omeprazole (PRILOSEC) 40 MG capsule Take by mouth.     potassium chloride SA (K-DUR,KLOR-CON) 20 MEQ tablet Take 1 tablet (20 mEq total) by mouth 2 (two) times daily. 60 tablet 3   torsemide (DEMADEX) 20 MG tablet TAKE 1 TABLET(20 MG) BY MOUTH TWICE DAILY 60 tablet 2   spironolactone (ALDACTONE) 25 MG tablet Take 0.5 tablets (12.5 mg total) by mouth at bedtime. 15 tablet 3   No current facility-administered medications for this  visit.     Allergies:   Patient has no known allergies.   Social History:  The patient  reports that she has never smoked. She has never used smokeless tobacco. She reports current alcohol use. She reports that she does not use drugs.   Family History:  The patient's  family history includes CAD in her mother.   ROS:  Please see the history of present illness.   All other systems are personally reviewed and negative.    Exam:    Vital Signs:  BP 121/76    Pulse 68    Wt 229 lb (103.9 kg)    BMI 36.96 kg/m   Well appearing, alert and conversant, regular work of breathing,  good skin color Eyes- anicteric, neuro- grossly intact, skin-  no apparent rash or lesions or cyanosis, mouth- oral mucosa is pink   Labs/Other Tests and Data Reviewed:    Recent Labs: 03/19/2018: B Natriuretic Peptide 122.7; BUN 14; Creatinine, Ser 0.73; Hemoglobin 12.6; Platelets 305; Potassium 3.5; Sodium 139   Wt Readings from Last 3 Encounters:  09/02/18 229 lb (103.9 kg)  03/19/18 223 lb (101.2 kg)  10/22/17 222 lb (100.7 kg)     Other studies personally reviewed: Additional studies/ records that were reviewed today include: Dr Darlene Hu' last note  Review of the above records today demonstrates: as above   Last device remote is reviewed from [employer] PDF dated 08/24/2018 which reveals normal device function, no arrhythmias   ASSESSMENT & PLAN:    1.  Chronic systolic dysfunction/ nonischemic CM CHF symptoms are stable Compliant with medicines Followed with ICM device clinic Recent device remotes are uptodate  Normal device function  2. Obesity Working diligently on weight loss s/p bariatric surgery  3. HTN Stable No change required today  4. COVID 19 screen The patient denies symptoms of COVID 19 at this time.  The importance of social distancing was discussed today.  Follow-up:  12 months with EP APP Next remote: 11/2018  Current medicines are reviewed at length with the patient today.   The patient does not have concerns regarding her medicines.  The following changes were made today:  none  Labs/ tests ordered today include:  No orders of the defined types were placed in this encounter.   Patient Risk:  after full review of this patients clinical status, I feel that they are at moderate to high risk at this time.  Today, I have spent 20 minutes with the patient with telehealth technology discussing CHF .    Darlene Fossa, MD  09/02/2018 10:51 AM     [facility] [address] [place] [address] [phone] (office) [phone] (fax)

## 2018-09-05 ENCOUNTER — Encounter: Payer: Self-pay | Admitting: Cardiology

## 2018-09-05 NOTE — Progress Notes (Signed)
Remote ICD transmission.   

## 2018-09-23 ENCOUNTER — Other Ambulatory Visit: Payer: Self-pay

## 2018-09-23 ENCOUNTER — Ambulatory Visit (INDEPENDENT_AMBULATORY_CARE_PROVIDER_SITE_OTHER): Payer: Self-pay

## 2018-09-23 DIAGNOSIS — I5042 Chronic combined systolic (congestive) and diastolic (congestive) heart failure: Secondary | ICD-10-CM

## 2018-09-23 DIAGNOSIS — Z95810 Presence of automatic (implantable) cardiac defibrillator: Secondary | ICD-10-CM

## 2018-09-24 ENCOUNTER — Telehealth: Payer: Self-pay

## 2018-09-24 NOTE — Telephone Encounter (Signed)
Spoke with patient to remind of missed remote transmission 

## 2018-09-25 NOTE — Progress Notes (Signed)
EPIC Encounter for ICM Monitoring  Patient Name: Darlene Pierce is a 53 y.o. female Date: 09/25/2018 Primary Care Physican: Vicenta Aly, [place] Primary Cardiologist: [place] Electrophysiologist: Allred Bi-V Pacing: 97% Lastweight225lbs  08/26/2018 Weight: 221 - 225 lbs 09/25/2018 Weight 225 lbs  Spoke with patient and she is asymptomatic for fluid accumulation.  She admits to eating more salt in her diet than she should in the last few days.   Thoracic impedance abnormal suggesting fluid accumulationstarting 09/22/2018 but trending back toward baseline.   Prescribed:Torsemide 20 mg 1 tablet twice a day. Spironolactone 25 mg take 0.5 tablet (12.5mg  total) daily. Potassium 20 mEqTake 1 tablet (20 mEq total) by mouth 2 (two) times daily.  Labs: 03/19/2018 Creatinine 0.73, BUN 14, Potassium 3.5, Sodium 139, GFR >60 10/26/2018Creatinine 0.91, BUN 14, Potassium 3.0, Sodium 139, GFR >60 03/06/2017 Creatinine 1.00, BUN 15, Potassium 3.4, Sodium 137, GFR >60  Recommendations:Recommended to increase Torsemide to 2 tablets every AM and 2 tablets every PM x 1 days and Potassium 2 tablets in AM and 1 tablet in PM x 1 days then return to prescribed dosages of Potassium and Torsemide.   Follow-up plan: ICM clinic phone appointment5/15/2020 to recheck fluid levels.  Copy of ICM check sent to Dr.Allred and Dr Haroldine Laws.  3 month ICM trend: 09/24/2018    1 Year ICM trend:       [name], RN 09/25/2018 12:07 PM

## 2018-10-04 ENCOUNTER — Ambulatory Visit (INDEPENDENT_AMBULATORY_CARE_PROVIDER_SITE_OTHER): Payer: Self-pay

## 2018-10-04 ENCOUNTER — Telehealth: Payer: Self-pay

## 2018-10-04 ENCOUNTER — Other Ambulatory Visit: Payer: Self-pay

## 2018-10-04 DIAGNOSIS — Z95810 Presence of automatic (implantable) cardiac defibrillator: Secondary | ICD-10-CM

## 2018-10-04 DIAGNOSIS — I5042 Chronic combined systolic (congestive) and diastolic (congestive) heart failure: Secondary | ICD-10-CM

## 2018-10-04 NOTE — Telephone Encounter (Signed)
Left message for patient to remind of missed remote transmission.  

## 2018-10-06 ENCOUNTER — Other Ambulatory Visit (HOSPITAL_COMMUNITY): Payer: Self-pay | Admitting: Internal Medicine

## 2018-10-07 NOTE — Progress Notes (Signed)
EPIC Encounter for ICM Monitoring  Patient Name: Darlene Pierce is a 53 y.o. female Date: 10/07/2018 Primary Care Physican: Vicenta Aly, [place] Primary Cardiologist: [place] Electrophysiologist: Allred Bi-V Pacing: 97% Lastweight225lbs  08/26/2018 Weight: 221 - 225 lbs 09/25/2018 Weight 225 lbs  Transmission reviewed and results sent via mychart.   Thoracic impedance returned to normalsince 09/23/2018 remote transmission and taking extra Torsemide.   Prescribed:Torsemide 20 mg 1 tablet twice a day. Spironolactone 25 mg take 0.5 tablet (12.5mg  total) daily. Potassium 20 mEqTake 1 tablet (20 mEq total) by mouth 2 (two) times daily.  Labs: 03/19/2018 Creatinine 0.73, BUN 14, Potassium 3.5, Sodium 139, GFR >60 10/26/2018Creatinine 0.91, BUN 14, Potassium 3.0, Sodium 139, GFR >60 03/06/2017 Creatinine 1.00, BUN 15, Potassium 3.4, Sodium 137, GFR >60  Recommendations: Advised to limit salt and fluid intake.   Follow-up plan: ICM clinic phone appointment6/16/2020.  Copy of ICM check sent to Dr.Allred.  3 month ICM trend: 10/05/2018    1 Year ICM trend:       [name], RN 10/07/2018 3:47 PM

## 2018-10-16 ENCOUNTER — Other Ambulatory Visit (HOSPITAL_COMMUNITY): Payer: Self-pay | Admitting: Internal Medicine

## 2018-10-16 ENCOUNTER — Telehealth: Payer: Self-pay | Admitting: Internal Medicine

## 2018-10-16 NOTE — Telephone Encounter (Signed)
Pt states that she needs a note from cardiologist, Dr. Rayann Heman, that she can wear a mask at work.   She states she already wears an N95 but this is a personal one they are fitting/giving to staff and they are requiring a note that says she can wear a mask. She can get fit tested another day. She understands Dr. Rayann Heman is not in the office today but will forward to he and his nurse to advise/write & fax note. Pt understands and agreeable to plan.

## 2018-10-16 NOTE — Telephone Encounter (Signed)
Patient has to be fit tested for a mask that she will have to wear at work. The place that does the fit testing said they need a note from her cardiologist stating that it is okay for her to wear a respirator at work. Please fax to [phone]. She is there at the office to be tested right now but they won't test her until they receive the note.

## 2018-11-05 ENCOUNTER — Ambulatory Visit (INDEPENDENT_AMBULATORY_CARE_PROVIDER_SITE_OTHER): Payer: Self-pay

## 2018-11-05 DIAGNOSIS — Z95810 Presence of automatic (implantable) cardiac defibrillator: Secondary | ICD-10-CM

## 2018-11-05 DIAGNOSIS — I5042 Chronic combined systolic (congestive) and diastolic (congestive) heart failure: Secondary | ICD-10-CM

## 2018-11-08 NOTE — Progress Notes (Signed)
EPIC Encounter for ICM Monitoring  Patient Name: Darlene Pierce is a 53 y.o. female Date: 11/08/2018 Primary Care Physican: Vicenta Aly, [place] Primary Cardiologist: [place] Electrophysiologist: Allred Bi-V Pacing: 97% 09/25/2018 Weight 225 lbs  Attempted call to patient and unable to reach.  Left message to return call. Transmission reviewed.   Corvue Thoracic impedancenormal.  Prescribed:Torsemide 20 mg 1 tablet twice a day. Spironolactone 25 mg take 0.5 tablet (12.5mg  total) daily. Potassium 20 mEqTake 1 tablet (20 mEq total) by mouth 2 (two) times daily.  Labs: 03/19/2018 Creatinine 0.73, BUN 14, Potassium 3.5, Sodium 139, GFR >60 10/26/2018Creatinine 0.91, BUN 14, Potassium 3.0, Sodium 139, GFR >60 03/06/2017 Creatinine 1.00, BUN 15, Potassium 3.4, Sodium 137, GFR >60  Recommendations: Unable to reach.    Follow-up plan: ICM clinic phone appointment7/20/2020.  Copy of ICM check sent to Dr.Allred.  3 month ICM trend: 11/05/2018    1 Year ICM trend:       [name], RN 11/08/2018 12:29 PM

## 2018-11-08 NOTE — Progress Notes (Addendum)
Patient returned call and stated she is doing well.  She denies any fluid symptoms.  Weight is stable at 225 lbs.  Transmission reviewed.  Encouraged to call if she experiences any fluid symptoms.  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

## 2018-11-26 ENCOUNTER — Ambulatory Visit (INDEPENDENT_AMBULATORY_CARE_PROVIDER_SITE_OTHER): Payer: Self-pay | Admitting: *Deleted

## 2018-11-26 DIAGNOSIS — I428 Other cardiomyopathies: Secondary | ICD-10-CM

## 2018-11-26 LAB — CUP PACEART REMOTE DEVICE CHECK
Battery Remaining Longevity: 28 mo
Battery Remaining Percentage: 32 %
Battery Voltage: 2.89 V
Brady Statistic AP VP Percent: 2.8 %
Brady Statistic AP VS Percent: 1 %
Brady Statistic AS VP Percent: 94 %
Brady Statistic AS VS Percent: 2.8 %
Brady Statistic RA Percent Paced: 3 %
Date Time Interrogation Session: 20200707060025
HighPow Impedance: 69 Ohm
HighPow Impedance: 69 Ohm
Implantable Lead Implant Date: 20150715
Implantable Lead Implant Date: 20150715
Implantable Lead Implant Date: 20150715
Implantable Lead Location: 753858
Implantable Lead Location: 753859
Implantable Lead Location: 753860
Implantable Pulse Generator Implant Date: 20150715
Lead Channel Impedance Value: 390 Ohm
Lead Channel Impedance Value: 440 Ohm
Lead Channel Impedance Value: 530 Ohm
Lead Channel Pacing Threshold Amplitude: 0.5 V
Lead Channel Pacing Threshold Amplitude: 0.75 V
Lead Channel Pacing Threshold Amplitude: 1 V
Lead Channel Pacing Threshold Pulse Width: 0.4 ms
Lead Channel Pacing Threshold Pulse Width: 0.4 ms
Lead Channel Pacing Threshold Pulse Width: 0.4 ms
Lead Channel Sensing Intrinsic Amplitude: 11.7 mV
Lead Channel Sensing Intrinsic Amplitude: 5 mV
Lead Channel Setting Pacing Amplitude: 2 V
Lead Channel Setting Pacing Amplitude: 2 V
Lead Channel Setting Pacing Amplitude: 2 V
Lead Channel Setting Pacing Pulse Width: 0.4 ms
Lead Channel Setting Pacing Pulse Width: 0.4 ms
Lead Channel Setting Sensing Sensitivity: 0.4 mV
Pulse Gen Serial Number: 7198577

## 2018-12-07 ENCOUNTER — Encounter: Payer: Self-pay | Admitting: Cardiology

## 2018-12-07 NOTE — Progress Notes (Signed)
Remote ICD transmission.   

## 2018-12-09 ENCOUNTER — Ambulatory Visit (INDEPENDENT_AMBULATORY_CARE_PROVIDER_SITE_OTHER): Payer: Self-pay

## 2018-12-09 DIAGNOSIS — I5042 Chronic combined systolic (congestive) and diastolic (congestive) heart failure: Secondary | ICD-10-CM

## 2018-12-09 DIAGNOSIS — Z95810 Presence of automatic (implantable) cardiac defibrillator: Secondary | ICD-10-CM

## 2018-12-13 NOTE — Progress Notes (Signed)
EPIC Encounter for ICM Monitoring  Patient Name: Kortnie Stovall is a 53 y.o. female Date: 12/13/2018 Primary Care Physican: Vicenta Aly, [place] Primary Cardiologist: [place] Electrophysiologist: Allred Bi-V Pacing: 97% 11/05/2018 Weight 225 lbs  Transmission reviewed and results sent via mychart.   Corvue Thoracic impedancenormal.  Prescribed:Torsemide 20 mg 1 tablet twice a day. Spironolactone 25 mg take 0.5 tablet (12.5mg  total) daily. Potassium 20 mEqTake 1 tablet (20 mEq total) by mouth 2 (two) times daily.  Labs: 03/19/2018 Creatinine 0.73, BUN 14, Potassium 3.5, Sodium 139, GFR >60 10/26/2018Creatinine 0.91, BUN 14, Potassium 3.0, Sodium 139, GFR >60 03/06/2017 Creatinine 1.00, BUN 15, Potassium 3.4, Sodium 137, GFR >60  Recommendations: None   Follow-up plan: ICM clinic phone appointment8/24/2020.  Copy of ICM check sent to Dr.Allred.  3 month ICM trend: 12/09/2018    1 Year ICM trend:       [name], RN 12/13/2018 11:03 AM

## 2019-01-14 ENCOUNTER — Telehealth: Payer: Self-pay

## 2019-01-14 NOTE — Telephone Encounter (Signed)
Left message for patient to remind of missed remote transmission.  

## 2019-01-20 NOTE — Progress Notes (Signed)
No ICM remote transmission received for 01/13/2019 and next ICM transmission scheduled for 02/26/2019.

## 2019-02-04 ENCOUNTER — Other Ambulatory Visit (HOSPITAL_COMMUNITY): Payer: Self-pay | Admitting: Internal Medicine

## 2019-02-06 ENCOUNTER — Encounter: Payer: Self-pay | Admitting: Podiatry

## 2019-02-06 ENCOUNTER — Ambulatory Visit (INDEPENDENT_AMBULATORY_CARE_PROVIDER_SITE_OTHER): Payer: 59 | Admitting: Podiatry

## 2019-02-06 ENCOUNTER — Ambulatory Visit (INDEPENDENT_AMBULATORY_CARE_PROVIDER_SITE_OTHER): Payer: 59

## 2019-02-06 ENCOUNTER — Other Ambulatory Visit: Payer: Self-pay

## 2019-02-06 VITALS — BP 159/96 | HR 83 | Resp 16

## 2019-02-06 DIAGNOSIS — M778 Other enthesopathies, not elsewhere classified: Secondary | ICD-10-CM

## 2019-02-06 DIAGNOSIS — M2011 Hallux valgus (acquired), right foot: Secondary | ICD-10-CM

## 2019-02-06 DIAGNOSIS — M19071 Primary osteoarthritis, right ankle and foot: Secondary | ICD-10-CM | POA: Diagnosis not present

## 2019-02-06 DIAGNOSIS — M109 Gout, unspecified: Secondary | ICD-10-CM

## 2019-02-06 DIAGNOSIS — M779 Enthesopathy, unspecified: Secondary | ICD-10-CM

## 2019-02-06 MED ORDER — METHYLPREDNISOLONE 4 MG PO TBPK: ORAL_TABLET | ORAL | 0 refills | Status: DC

## 2019-02-06 NOTE — Progress Notes (Signed)
**Note Darlene-Identified via Obfuscation** Subjective:  Patient ID: Darlene Pierce, female    DOB: August 16, 1965,  MRN: [id number] HPI Chief Complaint  Patient presents with  . Foot Pain    1st MPJ/toe right - aching, redness, swelling x several months intermittently, numbness medial side of hallux, worsened in the last few days, limited ROM, tried soaking and change in shoe gear  . New Patient (Initial Visit)    53 y.o. female presents with the above complaint.   ROS: Denies fever chills nausea vomiting muscle aches pains calf pain back pain chest pain shortness of breath.  Past Medical History:  Diagnosis Date  . CHF (congestive heart failure) (Darlene Pierce)   . H/O angiography    unsure of any PCI  . Hiatal hernia   . Hypertension   . Lymphoma (Darlene Pierce)    s/p chemotherapy 2012, radiation in 2000  . Multiple thyroid nodules   . Non-ischemic cardiomyopathy (Darlene Pierce)   . OSA (obstructive sleep apnea)    a. mild by sleep study 08/2013 - trial of weight loss;  refer to sleep med if unsucessful  . Respiratory failure (Darlene Pierce)    was admitted in [facility]arlene Pierce and was on ventilator   Past Surgical History:  Procedure Laterality Date  . BI-VENTRICULAR IMPLANTABLE CARDIOVERTER DEFIBRILLATOR  (CRT-D) Right 12/03/2013   SJM Dorma Russell Assura implanted in Darlene Pierce for primary prevention  . CHOLECYSTECTOMY    . FEMUR SURGERY    . KNEE SURGERY    . porta cath insertion     2 years  . RIGHT HEART CATHETERIZATION N/A 09/19/2013   Procedure: RIGHT HEART CATH;  Surgeon: Jolaine Artist, MD;  Location[facility] CATH LAB;  Service: Cardiovascular;  Laterality: N/A;  . TUBAL LIGATION      Current Outpatient Medications:  .  carvedilol (COREG) 25 MG tablet, TAKE 1 TABLET BY MOUTH TWICE DAILY WITH A MEAL, Disp: 60 tablet, Rfl: 2 .  Cholecalciferol (VITAMIN D) 2000 UNITS tablet, Take 2,000 Units by mouth daily., Disp: , Rfl:  .  ENTRESTO 97-103 MG, TAKE 1 TABLET BY MOUTH TWICE DAILY, Disp: 90 tablet, Rfl: 3 .  methylPREDNISolone (MEDROL DOSEPAK) 4 MG TBPK  tablet, 6 day dose pack - take as directed, Disp: 21 tablet, Rfl: 0 .  Multiple Vitamin (THERA) TABS, Take by mouth., Disp: , Rfl:  .  potassium chloride SA (K-DUR,KLOR-CON) 20 MEQ tablet, Take 1 tablet (20 mEq total) by mouth 2 (two) times daily., Disp: 60 tablet, Rfl: 3 .  torsemide (DEMADEX) 20 MG tablet, TAKE 1 TABLET BY MOUTH TWICE DAILY, Disp: 180 tablet, Rfl: 1  Allergies  Allergen Reactions  . Tomato Itching and Hives    Pt states this is just fresh tomatoes Pt states this is just fresh tomatoes    Review of Systems Objective:   Vitals:   02/06/19 1103  BP: (!) 159/96  Pulse: 83  Resp: 16    General: Well developed, nourished, in no acute distress, alert and oriented x3   Dermatological: Skin is warm, dry and supple bilateral. Nails x 10 are well maintained; remaining integument appears unremarkable at this time. There are no open sores, no preulcerative lesions, no rash or signs of infection present.  Vascular: Dorsalis Pedis artery and Posterior Tibial artery pedal pulses are 2/4 bilateral with immedate capillary fill time. Pedal hair growth present. No varicosities and no lower extremity edema present bilateral.   Neruologic: Grossly intact via light touch bilateral. Vibratory intact via tuning fork bilateral. Protective threshold with Semmes Wienstein monofilament intact  to all pedal sites bilateral. Patellar and Achilles deep tendon reflexes 2+ bilateral. No Babinski or clonus noted bilateral.   Musculoskeletal: No gross boney pedal deformities bilateral. No pain, crepitus, or limitation noted with foot and ankle range of motion bilateral. Muscular strength 5/5 in all groups tested bilateral.  Red hot swollen first metatarsal phalangeal joint of the right foot with a nonreducible hallux valgus deformity.  There is no crepitation but there is tenderness on range of motion.  She has tenderness on palpation of the dorsal medial aspect of the first metatarsal phalangeal joint.   Gait: Unassisted, Nonantalgic.    Radiographs:  Radiographs taken today demonstrate an increase in the first intermetatarsal angle greater than normal value hallux abductus angle greater than normal value with some joint space narrowing.  There appears to be some degenerative disease of the first metatarsophalangeal joint some cystic which leads to thoughts of gout as well as osteoarthritis.  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  Plan: Discussed etiology pathology conservative versus surgical therapies at this point provided her with blood work consisting of a CBC and arthritic profile provided her with Medrol Dosepak and injected periarticular around the first metatarsal phalangeal joint of the right foot.  She tolerated procedure well without complications.     Pieter Fooks T. Darlene Pierce, [place]

## 2019-02-07 LAB — URIC ACID: Uric Acid, Serum: 7.9 mg/dL — ABNORMAL HIGH (ref 2.5–7.0)

## 2019-02-07 LAB — CBC WITH DIFFERENTIAL/PLATELET
Absolute Monocytes: 350 cells/uL (ref 200–950)
Basophils Absolute: 21 cells/uL (ref 0–200)
Basophils Relative: 0.4 %
Eosinophils Absolute: 191 cells/uL (ref 15–500)
Eosinophils Relative: 3.6 %
HCT: 39.2 % (ref 35.0–45.0)
Hemoglobin: 12.8 g/dL (ref 11.7–15.5)
Lymphs Abs: 1696 cells/uL (ref 850–3900)
MCH: 28.3 pg (ref 27.0–33.0)
MCHC: 32.7 g/dL (ref 32.0–36.0)
MCV: 86.5 fL (ref 80.0–100.0)
MPV: 10 fL (ref 7.5–12.5)
Monocytes Relative: 6.6 %
Neutro Abs: 3042 cells/uL (ref 1500–7800)
Neutrophils Relative %: 57.4 %
Platelets: 313 10*3/uL (ref 140–400)
RBC: 4.53 10*6/uL (ref 3.80–5.10)
RDW: 12.2 % (ref 11.0–15.0)
Total Lymphocyte: 32 %
WBC: 5.3 10*3/uL (ref 3.8–10.8)

## 2019-02-07 LAB — C-REACTIVE PROTEIN: CRP: 10.5 mg/L — ABNORMAL HIGH (ref <8.0)

## 2019-02-07 LAB — RHEUMATOID FACTOR: Rheumatoid fact SerPl-aCnc: <14 IU/mL (ref <14)

## 2019-02-07 LAB — ANA: Anti Nuclear Antibody (ANA): NEGATIVE

## 2019-02-07 LAB — SEDIMENTATION RATE: Sed Rate: 19 mm/h (ref 0–30)

## 2019-02-11 ENCOUNTER — Telehealth: Payer: Self-pay | Admitting: *Deleted

## 2019-02-11 NOTE — Telephone Encounter (Signed)
Pt called and I informed of Dr. Stephenie Acres review of results and orders. Pt states she is doing better.

## 2019-02-11 NOTE — Telephone Encounter (Signed)
-----   Message from Garrel Ridgel, [place] sent at 02/11/2019  7:43 AM EDT ----- Gout and continue current tx plan.

## 2019-02-11 NOTE — Telephone Encounter (Signed)
Left message for pt to call for results  

## 2019-02-25 ENCOUNTER — Ambulatory Visit (INDEPENDENT_AMBULATORY_CARE_PROVIDER_SITE_OTHER): Payer: Self-pay | Admitting: *Deleted

## 2019-02-25 DIAGNOSIS — I428 Other cardiomyopathies: Secondary | ICD-10-CM

## 2019-02-25 DIAGNOSIS — R570 Cardiogenic shock: Secondary | ICD-10-CM

## 2019-02-26 ENCOUNTER — Ambulatory Visit (INDEPENDENT_AMBULATORY_CARE_PROVIDER_SITE_OTHER): Payer: Self-pay

## 2019-02-26 DIAGNOSIS — Z95810 Presence of automatic (implantable) cardiac defibrillator: Secondary | ICD-10-CM

## 2019-02-26 DIAGNOSIS — I5042 Chronic combined systolic (congestive) and diastolic (congestive) heart failure: Secondary | ICD-10-CM

## 2019-02-26 LAB — CUP PACEART REMOTE DEVICE CHECK
Battery Remaining Longevity: 26 mo
Battery Remaining Percentage: 30 %
Battery Voltage: 2.87 V
Brady Statistic AP VP Percent: 2.7 %
Brady Statistic AP VS Percent: 1 %
Brady Statistic AS VP Percent: 94 %
Brady Statistic AS VS Percent: 2.8 %
Brady Statistic RA Percent Paced: 2.9 %
Date Time Interrogation Session: 20201006110357
HighPow Impedance: 73 Ohm
HighPow Impedance: 73 Ohm
Implantable Lead Implant Date: 20150715
Implantable Lead Implant Date: 20150715
Implantable Lead Implant Date: 20150715
Implantable Lead Location: 753858
Implantable Lead Location: 753859
Implantable Lead Location: 753860
Implantable Pulse Generator Implant Date: 20150715
Lead Channel Impedance Value: 410 Ohm
Lead Channel Impedance Value: 450 Ohm
Lead Channel Impedance Value: 550 Ohm
Lead Channel Pacing Threshold Amplitude: 0.5 V
Lead Channel Pacing Threshold Amplitude: 0.75 V
Lead Channel Pacing Threshold Amplitude: 0.875 V
Lead Channel Pacing Threshold Pulse Width: 0.4 ms
Lead Channel Pacing Threshold Pulse Width: 0.4 ms
Lead Channel Pacing Threshold Pulse Width: 0.4 ms
Lead Channel Sensing Intrinsic Amplitude: 12 mV
Lead Channel Sensing Intrinsic Amplitude: 5 mV
Lead Channel Setting Pacing Amplitude: 2 V
Lead Channel Setting Pacing Amplitude: 2 V
Lead Channel Setting Pacing Amplitude: 2 V
Lead Channel Setting Pacing Pulse Width: 0.4 ms
Lead Channel Setting Pacing Pulse Width: 0.4 ms
Lead Channel Setting Sensing Sensitivity: 0.4 mV
Pulse Gen Serial Number: 7198577

## 2019-02-28 NOTE — Progress Notes (Signed)
EPIC Encounter for ICM Monitoring  Patient Name: Darlene Pierce is a 54 y.o. female Date: 02/28/2019 Primary Care Physican: Vicenta Aly, [place] Primary Cardiologist: [place] Electrophysiologist: Allred Bi-V Pacing: 97% 02/28/2019 Weight 225 lbs  Spoke with patient.  She denies fluid symptoms.    CorvueThoracic impedancenormal 02/18/2019.  Prescribed:Torsemide 20 mg 1 tablet twice a day. Spironolactone 25 mg take 0.5 tablet (12.5mg  total) daily. Potassium 20 mEqTake 1 tablet (20 mEq total) by mouth 2 (two) times daily.  Labs: 03/31/2018 Creatinine 0.79, BUN 20, Potassium 4.0, Sodium 143, GFR 87-100 Care Everywhere 03/19/2018 Creatinine 0.73, BUN 14, Potassium 3.5, Sodium 139, GFR >60  Recommendations: Advised to take Torsemide 2 tablets (40 mg) total twice a day x 2 days and also take Potassium 2 tablets AM and 2 tablets PM x 2 days.  Follow-up plan: ICM clinic phone appointment on 03/04/2019 (manual send) to recheck fluid levels.   91 day device clinic remote transmission 05/26/2018.    Copy of ICM check sent to Dr. Rayann Heman and Dr Haroldine Laws.   3 month ICM trend: 02/27/2019    1 Year ICM trend:       Darlene Billings, RN 02/28/2019 5:03 PM

## 2019-03-04 ENCOUNTER — Ambulatory Visit (INDEPENDENT_AMBULATORY_CARE_PROVIDER_SITE_OTHER): Payer: Self-pay

## 2019-03-04 DIAGNOSIS — I5042 Chronic combined systolic (congestive) and diastolic (congestive) heart failure: Secondary | ICD-10-CM

## 2019-03-04 DIAGNOSIS — Z95810 Presence of automatic (implantable) cardiac defibrillator: Secondary | ICD-10-CM

## 2019-03-05 ENCOUNTER — Other Ambulatory Visit (HOSPITAL_COMMUNITY): Payer: Self-pay | Admitting: Internal Medicine

## 2019-03-06 NOTE — Progress Notes (Signed)
Remote ICD transmission.   

## 2019-03-07 NOTE — Progress Notes (Signed)
EPIC Encounter for ICM Monitoring  Patient Name: Darlene Pierce is a 53 y.o. female Date: 03/07/2019 Primary Care Physican: Vicenta Aly, [place] Primary Cardiologist: [place] Electrophysiologist: Allred Bi-V Pacing: 97% 02/28/2019 Weight 225 lbs  Attempted call to patient and unable to reach.  Left detailed message per DPR regarding transmission. Transmission reviewed.   CorvueThoracic impedancereturned to normal after taking additional Torsemide.  Impedance was suggesting fluid accumulation from 02/18/2019 - 03/01/2019  Prescribed:Torsemide 20 mg 1 tablet twice a day. Spironolactone 25 mg take 0.5 tablet (12.5mg  total) daily. Potassium 20 mEqTake 1 tablet (20 mEq total) by mouth 2 (two) times daily.  Labs: 03/31/2018 Creatinine 0.79, BUN 20, Potassium 4.0, Sodium 143, GFR 87-100 Care Everywhere 03/19/2018 Creatinine 0.73, BUN 14, Potassium 3.5, Sodium 139, GFR >60  Recommendations: Left voice mail with ICM number and encouraged to call if experiencing any fluid symptoms.  Follow-up plan: ICM clinic phone appointment on 03/31/2019.   91 day device clinic remote transmission 05/27/2019.      Copy of ICM check sent to Dr. Rayann Heman.   3 month ICM trend: 03/04/2019    1 Year ICM trend:       [name], RN 03/07/2019 12:30 PM

## 2019-03-20 ENCOUNTER — Ambulatory Visit: Payer: 59 | Admitting: Podiatry

## 2019-03-28 ENCOUNTER — Other Ambulatory Visit (HOSPITAL_BASED_OUTPATIENT_CLINIC_OR_DEPARTMENT_OTHER): Payer: Self-pay | Admitting: Physician Assistant

## 2019-03-28 DIAGNOSIS — Z1231 Encounter for screening mammogram for malignant neoplasm of breast: Secondary | ICD-10-CM

## 2019-03-31 ENCOUNTER — Ambulatory Visit (INDEPENDENT_AMBULATORY_CARE_PROVIDER_SITE_OTHER): Payer: Self-pay

## 2019-03-31 DIAGNOSIS — Z95810 Presence of automatic (implantable) cardiac defibrillator: Secondary | ICD-10-CM

## 2019-03-31 DIAGNOSIS — I5042 Chronic combined systolic (congestive) and diastolic (congestive) heart failure: Secondary | ICD-10-CM

## 2019-03-31 NOTE — Progress Notes (Signed)
EPIC Encounter for ICM Monitoring  Patient Name: Darlene Pierce is a 53 y.o. female Date: 03/31/2019 Primary Care Physican: Vicenta Aly, [place] Primary Cardiologist: [place] Electrophysiologist: Allred Bi-V Pacing: 97% 02/28/2019 Weight: 225 lbs 03/30/2019 Weight: 234 lbs  Spoke with patient and is symptomatic with abdominal bloating and weight gain.  She has already taken extra Torsemide x 2 days which is improving symptoms   CorvueThoracic impedance suggesting possible fluid accumulation from 10/27 - 11/4, 11/6 - 11/8 and 11/9.  Prescribed:Torsemide 20 mg 1 tablet twice a day. Spironolactone 25 mg take 0.5 tablet (12.5mg  total) daily. Potassium 20 mEqTake 1 tablet (20 mEq total) by mouth 2 (two) times daily.  Labs: 03/31/2018 Creatinine 0.79, BUN 20, Potassium 4.0, Sodium 143, GFR 87-100 Care Everywhere 03/19/2018 Creatinine 0.73, BUN 14, Potassium 3.5, Sodium 139, GFR >60  Recommendations: Advised to take extra Torsemide 20 mg 1 tablet x 2 days and 1 extra Potassium 20 mEq x 2 days  Follow-up plan: ICM clinic phone appointment on 04/08/2019 to recheck fluid levels.   91 day device clinic remote transmission 05/27/2019.    Copy of ICM check sent to Dr. Rayann Heman and Dr Haroldine Laws.   3 month ICM trend: 03/31/2019    1 Year ICM trend:       [name], RN 03/31/2019 1:05 PM

## 2019-04-08 ENCOUNTER — Ambulatory Visit (INDEPENDENT_AMBULATORY_CARE_PROVIDER_SITE_OTHER): Payer: Self-pay

## 2019-04-08 ENCOUNTER — Telehealth: Payer: Self-pay

## 2019-04-08 DIAGNOSIS — I5042 Chronic combined systolic (congestive) and diastolic (congestive) heart failure: Secondary | ICD-10-CM

## 2019-04-08 DIAGNOSIS — Z95810 Presence of automatic (implantable) cardiac defibrillator: Secondary | ICD-10-CM

## 2019-04-08 NOTE — Telephone Encounter (Signed)
Left message for patient to remind of missed remote transmission.  

## 2019-04-11 ENCOUNTER — Ambulatory Visit (HOSPITAL_BASED_OUTPATIENT_CLINIC_OR_DEPARTMENT_OTHER)
Admission: RE | Admit: 2019-04-11 | Discharge: 2019-04-11 | Disposition: A | Discharge status: Home / Self Care | Payer: 59 | Source: Ambulatory Visit | Attending: Physician Assistant | Admitting: Physician Assistant

## 2019-04-11 ENCOUNTER — Other Ambulatory Visit: Payer: Self-pay

## 2019-04-11 DIAGNOSIS — Z1231 Encounter for screening mammogram for malignant neoplasm of breast: Secondary | ICD-10-CM | POA: Insufficient documentation

## 2019-04-11 NOTE — Progress Notes (Signed)
EPIC Encounter for ICM Monitoring  Patient Name: Darlene Pierce is a 53 y.o. female Date: 04/11/2019 Primary Care Physican: Vicenta Aly, [place] Primary Cardiologist: [place] Electrophysiologist: Allred Bi-V Pacing: 97% 03/30/2019 Weight: 234 lbs  Spoke with patient and she is feeling okay.  She thinks the fluid symptoms have resolved.   CorvueThoracic impedance returned to normal since 03/31/2019 remote transmission.  Prescribed:Torsemide 20 mg 1 tablet twice a day. Spironolactone 25 mg take 0.5 tablet (12.5mg  total) daily. Potassium 20 mEqTake 1 tablet (20 mEq total) by mouth 2 (two) times daily.  Labs: 03/31/2018 Creatinine 0.79, BUN 20, Potassium 4.0, Sodium 143, GFR 87-100 Care Everywhere 03/19/2018 Creatinine 0.73, BUN 14, Potassium 3.5, Sodium 139, GFR >60  Recommendations: No changes and encouraged to call if experiencing any fluid symptoms.  Follow-up plan: ICM clinic phone appointment on 05/05/2019.     Copy of ICM check sent to Dr. Rayann Heman.   Direct Trend Viewer through 04/11/2019   3 month ICM trend: 04/08/2019   1 Year ICM trend:      [name], RN 04/11/2019 3:18 PM

## 2019-04-24 ENCOUNTER — Other Ambulatory Visit: Payer: Self-pay

## 2019-04-24 ENCOUNTER — Ambulatory Visit (INDEPENDENT_AMBULATORY_CARE_PROVIDER_SITE_OTHER): Payer: 59 | Admitting: Podiatry

## 2019-04-24 DIAGNOSIS — M778 Other enthesopathies, not elsewhere classified: Secondary | ICD-10-CM

## 2019-04-26 NOTE — Progress Notes (Signed)
She presents today for her 6-week follow-up she states that is doing better swelling is gone down  Objective: Vital signs are stable she is alert and oriented x3 hallux valgus deformity of the right foot with mild tenderness on end range of motion.  She has pain on palpation of the dorsal medial aspect of the right first metatarsophalangeal joint.  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  Plan: At this point we discussed the need for surgical intervention also discussed gout.  And treatment for this.  She would like to have an injection at this time so after sterile Betadine skin prep I injected 10 mg of Kenalog 5 mg of Marcaine into the first metatarsophalangeal joint itself.  She tolerated this procedure well will follow up with me as needed

## 2019-05-05 ENCOUNTER — Ambulatory Visit (INDEPENDENT_AMBULATORY_CARE_PROVIDER_SITE_OTHER): Payer: 59

## 2019-05-05 DIAGNOSIS — I5042 Chronic combined systolic (congestive) and diastolic (congestive) heart failure: Secondary | ICD-10-CM | POA: Diagnosis not present

## 2019-05-05 DIAGNOSIS — Z95810 Presence of automatic (implantable) cardiac defibrillator: Secondary | ICD-10-CM | POA: Diagnosis not present

## 2019-05-06 NOTE — Progress Notes (Signed)
EPIC Encounter for ICM Monitoring  Patient Name: Darlene Pierce is a 53 y.o. female Date: 05/06/2019 Primary Care Physican: Vicenta Aly, [place] Primary Cardiologist: [place] Electrophysiologist: Allred Bi-V Pacing: 97% 03/30/2019 Weight: 234 lbs  Spoke with patient and she feels reasonably well except for severe heartburn.  She asked if she should take COVID vaccine when it becomes available.  He has high risk exposure and works with COVID patients.  Advised to send Dr Bensimhon's office a mychart message to discuss when or if she should take the vaccine.  CorvueThoracic impedancenormal.  Prescribed:Torsemide 20 mg 1 tablet twice a day. Spironolactone 25 mg take 0.5 tablet (12.5mg  total) daily. Potassium 20 mEqTake 1 tablet (20 mEq total) by mouth 2 (two) times daily.  Labs: 03/31/2018 Creatinine 0.79, BUN 20, Potassium 4.0, Sodium 143, GFR 87-100 Care Everywhere 03/19/2018 Creatinine 0.73, BUN 14, Potassium 3.5, Sodium 139, GFR >60  Recommendations: No changes and encouraged to call if experiencing any fluid symptoms.  Follow-up plan: ICM clinic phone appointment on 06/09/2019.   91 device clinic remote transmission 05/27/2019.  Copy of ICM check sent to Dr. Rayann Heman.   3 month ICM trend: 05/06/2019    1 Year ICM trend:       [name], RN 05/06/2019 3:17 PM

## 2019-05-27 ENCOUNTER — Ambulatory Visit: Payer: Medicare Other | Admitting: Podiatry

## 2019-05-27 ENCOUNTER — Ambulatory Visit (INDEPENDENT_AMBULATORY_CARE_PROVIDER_SITE_OTHER): Payer: 59 | Admitting: *Deleted

## 2019-05-27 DIAGNOSIS — I428 Other cardiomyopathies: Secondary | ICD-10-CM

## 2019-05-27 DIAGNOSIS — I5023 Acute on chronic systolic (congestive) heart failure: Secondary | ICD-10-CM

## 2019-05-28 ENCOUNTER — Telehealth: Payer: Self-pay

## 2019-05-28 LAB — CUP PACEART REMOTE DEVICE CHECK
Battery Remaining Longevity: 23 mo
Battery Remaining Percentage: 27 %
Battery Voltage: 2.87 V
Brady Statistic AP VP Percent: 2.7 %
Brady Statistic AP VS Percent: 1 %
Brady Statistic AS VP Percent: 94 %
Brady Statistic AS VS Percent: 2.9 %
Brady Statistic RA Percent Paced: 2.9 %
Date Time Interrogation Session: 20210106114142
HighPow Impedance: 73 Ohm
HighPow Impedance: 73 Ohm
Implantable Lead Implant Date: 20150715
Implantable Lead Implant Date: 20150715
Implantable Lead Implant Date: 20150715
Implantable Lead Location: 753858
Implantable Lead Location: 753859
Implantable Lead Location: 753860
Implantable Pulse Generator Implant Date: 20150715
Lead Channel Impedance Value: 390 Ohm
Lead Channel Impedance Value: 410 Ohm
Lead Channel Impedance Value: 550 Ohm
Lead Channel Pacing Threshold Amplitude: 0.5 V
Lead Channel Pacing Threshold Amplitude: 0.875 V
Lead Channel Pacing Threshold Amplitude: 0.875 V
Lead Channel Pacing Threshold Pulse Width: 0.4 ms
Lead Channel Pacing Threshold Pulse Width: 0.4 ms
Lead Channel Pacing Threshold Pulse Width: 0.4 ms
Lead Channel Sensing Intrinsic Amplitude: 12 mV
Lead Channel Sensing Intrinsic Amplitude: 4.8 mV
Lead Channel Setting Pacing Amplitude: 2 V
Lead Channel Setting Pacing Amplitude: 2 V
Lead Channel Setting Pacing Amplitude: 2 V
Lead Channel Setting Pacing Pulse Width: 0.4 ms
Lead Channel Setting Pacing Pulse Width: 0.4 ms
Lead Channel Setting Sensing Sensitivity: 0.4 mV
Pulse Gen Serial Number: 7198577

## 2019-05-28 NOTE — Telephone Encounter (Signed)
Spoke with patient to remind of missed remote transmission 

## 2019-06-10 ENCOUNTER — Telehealth: Payer: Self-pay

## 2019-06-10 NOTE — Telephone Encounter (Signed)
Left message for patient to remind of missed remote transmission.  

## 2019-06-17 NOTE — Progress Notes (Signed)
No ICM remote transmission received for 06/09/2019 and next ICM transmission scheduled for 06/30/2019.   

## 2019-06-30 ENCOUNTER — Ambulatory Visit (INDEPENDENT_AMBULATORY_CARE_PROVIDER_SITE_OTHER): Payer: 59

## 2019-06-30 DIAGNOSIS — I5042 Chronic combined systolic (congestive) and diastolic (congestive) heart failure: Secondary | ICD-10-CM | POA: Diagnosis not present

## 2019-06-30 DIAGNOSIS — Z95810 Presence of automatic (implantable) cardiac defibrillator: Secondary | ICD-10-CM | POA: Diagnosis not present

## 2019-07-04 ENCOUNTER — Telehealth: Payer: Self-pay

## 2019-07-04 NOTE — Progress Notes (Signed)
EPIC Encounter for ICM Monitoring  Patient Name: Darlene Pierce is a 54 y.o. female Date: 07/04/2019 Primary Care Physican: Vicenta Aly, [place] Primary Cardiologist: [place] Electrophysiologist: Allred Bi-V Pacing: 97% 07/04/2019 Weight: 234 lbs        Spoke with patient and she is feeling fine since being discharged from the hospital on 1/31.  She denies any fluid symptoms at this time.   Hospital admission 06/19/2019 - 06/22/2019 for shortness of breath due to COVID.  CorvueThoracic impedanceis at baseline today, 2/12 but was suggesting possible fluid accumulation from 06/21/2019 correlating with hospital admission to [facility] on 06/19/2019.  Prescribed:  Torsemide 20 mg 1 tablet twice a day.   Potassium 20 mEqTake 1 tablet (20 mEq total) by mouth 2 (two) times daily.  Labs: 06/22/2019 Creatinine 0.93, BUN 24, Potassium 3.5, Sodium 139, GFR 82 Care Everywhere 06/21/2019 Creatinine 0.98, BUN 23, Potassium 4.0, Sodium 141, GFR 77 Care Everywhere 06/20/2019 Creatinine 1.22, BUN 24, Potassium 3.1, Sodium 137, GFR 59 Care Everywhere 06/19/2019 Creatinine 1.48, BUN 26, Potassium 3.2, Sodium 138, GFR 47 Care Everywhere A complete set of results can be found in Results Review.  Recommendations:  Phone note sent to Dr Haroldine Laws for review.  Confirmed she is taking Torsemide as prescribed.  Advised to call 911 if she develops any urgent fluid symptoms.    Follow-up plan: ICM clinic phone appointment on 07/08/2019 (manual send) to recheck fluid levels.     Copy of ICM check sent to Dr. Rayann Heman and phone note sent to Dr Haroldine Laws for review and recommedations.   3 month ICM trend: 07/03/2019    1 Year ICM trend:       [name], RN 07/04/2019 11:10 AM

## 2019-07-04 NOTE — Telephone Encounter (Signed)
Spoke with patient. She was hospitalized from 1/28 - 1/31 because of SOB related to COVID.  She is asymptomatic for fluid accumulation today and feeling fine since 1/31 hospital discharge.   CorvueThoracic impedanceis at baseline today, 2/12 but was suggesting possible fluid accumulation from 06/21/2019 correlating with hospital admission to [facility] on 06/19/2019.  Prescribed:  Torsemide 20 mg 1 tablet twice a day.   Potassium 20 mEqTake 1 tablet (20 mEq total) by mouth 2 (two) times daily.  Labs: 06/22/2019 Creatinine 0.93, BUN 24, Potassium 3.5, Sodium 139, GFR 82 Care Everywhere 06/21/2019 Creatinine 0.98, BUN 23, Potassium 4.0, Sodium 141, GFR 77 Care Everywhere 06/20/2019 Creatinine 1.22, BUN 24, Potassium 3.1, Sodium 137, GFR 59 Care Everywhere 06/19/2019 Creatinine 1.48, BUN 26, Potassium 3.2, Sodium 138, GFR 47 Care Everywhere A complete set of results can be found in Results Review.  Advised patient to continue to monitor for fluid symptoms and to call 911 needed if she has any urgent symptoms.   She confirms taking Torsemide as prescribed.   Sent to Dr Haroldine Laws for review and recommendations if needed.   Follow-up plan: ICM clinic phone appointment on 07/08/2019 (manual send) to recheck fluid levels.      3 month ICM trend: 07/03/2019    1 Year ICM trend:

## 2019-07-07 NOTE — Telephone Encounter (Signed)
Increase torsemide to 40/20 and increase k dur by 20 meq daily.

## 2019-07-08 ENCOUNTER — Ambulatory Visit (INDEPENDENT_AMBULATORY_CARE_PROVIDER_SITE_OTHER): Payer: 59

## 2019-07-08 DIAGNOSIS — Z95810 Presence of automatic (implantable) cardiac defibrillator: Secondary | ICD-10-CM

## 2019-07-08 DIAGNOSIS — I5042 Chronic combined systolic (congestive) and diastolic (congestive) heart failure: Secondary | ICD-10-CM

## 2019-07-08 NOTE — Telephone Encounter (Signed)
Looks great. No need to change.  thanks

## 2019-07-08 NOTE — Progress Notes (Signed)
EPIC Encounter for ICM Monitoring  Patient Name: Darlene Pierce is a 54 y.o. female Date: 07/08/2019 Primary Care Physican: Vicenta Aly, [place] Primary Cardiologist: [place] Electrophysiologist: Allred Bi-V Pacing: 97% 07/04/2019 Weight: 234 lbs        Spoke with patient and she is feeling fine.   She denies any fluid symptoms.    CorvueThoracic impedanceis above baseline normal.  Remote transmission reviewed by Dr Haroldine Laws on phone note and no changes recommended.  Prescribed:  Torsemide 20 mg 1 tablet twice a day.   Potassium 20 mEqTake 1 tablet (20 mEq total) by mouth 2 (two) times daily.  Labs: 06/22/2019 Creatinine 0.93, BUN 24, Potassium 3.5, Sodium 139, GFR 82 Care Everywhere 06/21/2019 Creatinine 0.98, BUN 23, Potassium 4.0, Sodium 141, GFR 77 Care Everywhere 06/20/2019 Creatinine 1.22, BUN 24, Potassium 3.1, Sodium 137, GFR 59 Care Everywhere 06/19/2019 Creatinine 1.48, BUN 26, Potassium 3.2, Sodium 138, GFR 47 Care Everywhere A complete set of results can be found in Results Review.  Recommendations: No changes and encouraged to call if experiencing any fluid symptoms.  Follow-up plan: ICM clinic phone appointment on 08/04/2019.     Copy of ICM check sent to Dr. Rayann Heman  3 month ICM trend: 07/08/2019    1 Year ICM trend:       [name], RN 07/08/2019 2:28 PM

## 2019-07-08 NOTE — Telephone Encounter (Signed)
Spoke with patient and advised to send updated remote transmission today before proceeding with Dr Bensimhon's recommendations.  She is feeling fine today and will send report.  Will call her back after report is reviewed.

## 2019-07-08 NOTE — Telephone Encounter (Signed)
Dr Haroldine Laws, I checked a report today before giving patient your recommendation to increase torsemide to 40/20 and increase k dur by 20 meq daily.     Do you still want to proceed with Torsemide and [place] increase after seeing the updated Corvue from today?   07/08/2019 Corvue impedance above baseline.

## 2019-07-08 NOTE — Telephone Encounter (Signed)
Attempted patient call and left message there are no changes at this time.  Left call back number.

## 2019-07-08 NOTE — Telephone Encounter (Signed)
Patient returned call and Dr Haroldine Laws advised no changes today based on 2/16 remote transmission. Encouraged to call for any changes.

## 2019-08-04 ENCOUNTER — Ambulatory Visit (INDEPENDENT_AMBULATORY_CARE_PROVIDER_SITE_OTHER): Payer: 59

## 2019-08-04 DIAGNOSIS — I5042 Chronic combined systolic (congestive) and diastolic (congestive) heart failure: Secondary | ICD-10-CM | POA: Diagnosis not present

## 2019-08-04 DIAGNOSIS — Z95810 Presence of automatic (implantable) cardiac defibrillator: Secondary | ICD-10-CM

## 2019-08-06 NOTE — Progress Notes (Signed)
EPIC Encounter for ICM Monitoring  Patient Name: Darlene Pierce is a 54 y.o. female Date: 08/06/2019 Primary Care Physican: Vicenta Aly, [place] Primary Cardiologist: [place] Electrophysiologist: Allred Bi-V Pacing: 97% 2/12/2021Weight: 234 lbs  Transmission reviewed.  CorvueThoracic impedancenormal.    Prescribed:  Torsemide 20 mg 1 tablet twice a day.   Potassium 20 mEqTake 1 tablet (20 mEq total) by mouth 2 (two) times daily.  Labs: 06/22/2019 Creatinine0.93Almyra Free, [id number], [facility] Care Everywhere 06/21/2019 Creatinine0.98, Allene Pyo, [id number], [facility] Care Everywhere 06/20/2019 Creatinine1.22, Salvadore Farber, [id number], [facility] Care Everywhere 01/28/2021Creatinine 1.48, BUN26, Potassium3.2, [place], GFR47 Care Everywhere A complete set of results can be found in Results Review.  Recommendations: None  Follow-up plan: ICM clinic phone appointment on4/19/2021. 91 day device clinic remote transmission scheduled for 08/26/2019.  Copy of ICM check sent to Dr.Allred  3 month ICM trend: 07/30/2019    1 Year ICM trend:       [name], RN 08/06/2019 4:27 PM

## 2019-08-26 ENCOUNTER — Ambulatory Visit (INDEPENDENT_AMBULATORY_CARE_PROVIDER_SITE_OTHER): Payer: 59 | Admitting: *Deleted

## 2019-08-26 DIAGNOSIS — I428 Other cardiomyopathies: Secondary | ICD-10-CM | POA: Diagnosis not present

## 2019-08-28 LAB — CUP PACEART REMOTE DEVICE CHECK
Battery Remaining Longevity: 24 mo
Battery Remaining Percentage: 28 %
Battery Voltage: 2.84 V
Brady Statistic AP VP Percent: 2.7 %
Brady Statistic AP VS Percent: 1 %
Brady Statistic AS VP Percent: 94 %
Brady Statistic AS VS Percent: 3 %
Brady Statistic RA Percent Paced: 2.8 %
Date Time Interrogation Session: 20210408002558
HighPow Impedance: 73 Ohm
HighPow Impedance: 73 Ohm
Implantable Lead Implant Date: 20150715
Implantable Lead Implant Date: 20150715
Implantable Lead Implant Date: 20150715
Implantable Lead Location: 753858
Implantable Lead Location: 753859
Implantable Lead Location: 753860
Implantable Pulse Generator Implant Date: 20150715
Lead Channel Impedance Value: 430 Ohm
Lead Channel Impedance Value: 540 Ohm
Lead Channel Impedance Value: 600 Ohm
Lead Channel Pacing Threshold Amplitude: 0.5 V
Lead Channel Pacing Threshold Amplitude: 0.875 V
Lead Channel Pacing Threshold Amplitude: 1 V
Lead Channel Pacing Threshold Pulse Width: 0.4 ms
Lead Channel Pacing Threshold Pulse Width: 0.4 ms
Lead Channel Pacing Threshold Pulse Width: 0.4 ms
Lead Channel Sensing Intrinsic Amplitude: 12 mV
Lead Channel Sensing Intrinsic Amplitude: 5 mV
Lead Channel Setting Pacing Amplitude: 2 V
Lead Channel Setting Pacing Amplitude: 2 V
Lead Channel Setting Pacing Amplitude: 2 V
Lead Channel Setting Pacing Pulse Width: 0.4 ms
Lead Channel Setting Pacing Pulse Width: 0.4 ms
Lead Channel Setting Sensing Sensitivity: 0.4 mV
Pulse Gen Serial Number: 7198577

## 2019-08-28 NOTE — Progress Notes (Signed)
ICD Remote  

## 2019-09-05 ENCOUNTER — Other Ambulatory Visit (HOSPITAL_COMMUNITY): Payer: Self-pay | Admitting: Internal Medicine

## 2019-09-08 ENCOUNTER — Ambulatory Visit (INDEPENDENT_AMBULATORY_CARE_PROVIDER_SITE_OTHER): Payer: 59

## 2019-09-08 DIAGNOSIS — Z95810 Presence of automatic (implantable) cardiac defibrillator: Secondary | ICD-10-CM

## 2019-09-08 DIAGNOSIS — I5042 Chronic combined systolic (congestive) and diastolic (congestive) heart failure: Secondary | ICD-10-CM | POA: Diagnosis not present

## 2019-09-10 NOTE — Progress Notes (Signed)
EPIC Encounter for ICM Monitoring  Patient Name: Darlene Pierce is a 54 y.o. female Date: 09/10/2019 Primary Care Physican: Vicenta Aly, [place] Primary Cardiologist: [place] Electrophysiologist: Allred Bi-V Pacing: 97% 4/21/2021Weight: 234 lbs  Spoke with patient and reports feeling well at this time.  Denies fluid symptoms.    CorvueThoracic impedancenormal.   Prescribed:  Torsemide 20 mg 1 tablet twice a day.   Potassium 20 mEqTake 1 tablet (20 mEq total) by mouth 2 (two) times daily.  Labs: 06/22/2019 Creatinine0.93Almyra Free, [id number], [facility] Care Everywhere 06/21/2019 Creatinine0.98, Allene Pyo, [id number], [facility] Care Everywhere 06/20/2019 Creatinine1.22, Salvadore Farber, [id number], [facility] Care Everywhere 01/28/2021Creatinine 1.48, BUN26, Potassium3.2, [place], GFR47 Care Everywhere A complete set of results can be found in Results Review.  Recommendations:No changes and encouraged to call if experiencing any fluid symptoms.  Follow-up plan: ICM clinic phone appointment on5/24/2021.91 day device clinic remote transmission scheduled for 11/25/2019.  Copy of ICM check sent to Dr.Allred  3 month ICM trend: 09/08/2019    1 Year ICM trend:       [name], RN 09/10/2019 10:39 AM

## 2019-10-13 ENCOUNTER — Ambulatory Visit (INDEPENDENT_AMBULATORY_CARE_PROVIDER_SITE_OTHER): Payer: 59

## 2019-10-13 DIAGNOSIS — Z95810 Presence of automatic (implantable) cardiac defibrillator: Secondary | ICD-10-CM | POA: Diagnosis not present

## 2019-10-13 DIAGNOSIS — I5042 Chronic combined systolic (congestive) and diastolic (congestive) heart failure: Secondary | ICD-10-CM | POA: Diagnosis not present

## 2019-10-17 NOTE — Progress Notes (Signed)
EPIC Encounter for ICM Monitoring  Patient Name: Darlene Pierce is a 54 y.o. female Date: 10/17/2019 Primary Care Physican: Vicenta Aly, [place] Primary Cardiologist: [place] Electrophysiologist: Allred Bi-V Pacing: 97% 4/21/2021Weight: 234 lbs   Transmission reviewed.   CorvueThoracic impedancenormal.   Prescribed:  Torsemide 20 mg 1 tablet twice a day.   Potassium 20 mEqTake 1 tablet (20 mEq total) by mouth 2 (two) times daily.  Labs: 06/22/2019 Creatinine0.93Almyra Free, [id number], [facility] Care Everywhere 06/21/2019 Creatinine0.98, Allene Pyo, [id number], [facility] Care Everywhere 06/20/2019 Creatinine1.22, Salvadore Farber, [id number], [facility] Care Everywhere 01/28/2021Creatinine 1.48, BUN26, Potassium3.2, [place], GFR47 Care Everywhere A complete set of results can be found in Results Review.  Recommendations:None  Follow-up plan: ICM clinic phone appointment on6/28/2021.91 day device clinic remote transmission scheduled for 11/25/2019.  Message sent to Dr Jackalyn Lombard scheduler to call patient for routine appointment that is needed for refills.  Copy of ICM check sent to Dr.Allred  3 month ICM trend: 10/14/2019    1 Year ICM trend:       [name], RN 10/17/2019 1:26 PM

## 2019-11-03 NOTE — Progress Notes (Signed)
Electrophysiology Office Note Date: 11/04/2019  ID:  Darlene Pierce, DOB 07/28/1965, MRN [id number]  PCP: Vicenta Aly, [place] Primary Cardiologist: No primary care provider on file. Electrophysiologist: Dr. Rayann Heman  CC: Routine ICD follow-up  Darlene Pierce is a 54 y.o. female seen today for Dr. Rayann Heman for routine electrophysiology followup.  Since last being seen in our clinic the patient reports doing well from a cardiac perspective. She is having severe, mechanical acid reflux and is pending gastric sleeve revision.  she denies chest pain, palpitations, dyspnea, PND, orthopnea, nausea, vomiting, dizziness, syncope, edema, weight gain, or early satiety. She has not had ICD shocks.   Device History: St. Jude BiV ICD implanted 2015 for chronic systolic CHF,NICM History of appropriate therapy: No History of AAD therapy: No   Past Medical History:  Diagnosis Date  . CHF (congestive heart failure) ([place])   . H/O angiography    unsure of any PCI  . Hiatal hernia   . Hypertension   . Lymphoma ([place])    s/p chemotherapy 2012, radiation in 2000  . Multiple thyroid nodules   . Non-ischemic cardiomyopathy ([place])   . OSA (obstructive sleep apnea)    a. mild by sleep study 08/2013 - trial of weight loss;  refer to sleep med if unsucessful  . Respiratory failure ([place])    was [place] [facility] and was on ventilator   Past Surgical History:  Procedure Laterality Date  . BI-VENTRICULAR IMPLANTABLE CARDIOVERTER DEFIBRILLATOR  (CRT-D) Right 12/03/2013   SJM Dorma Russell Assura implanted in [place] for primary prevention  . CHOLECYSTECTOMY    . FEMUR SURGERY    . KNEE SURGERY    . porta cath insertion     2 years  . RIGHT HEART CATHETERIZATION N/A 09/19/2013   Procedure: RIGHT HEART CATH;  Surgeon: Jolaine Artist, MD;  Location: [facility] CATH LAB;  Service: Cardiovascular;  Laterality: N/A;  . TUBAL LIGATION      Current Outpatient Medications  Medication Sig Dispense Refill  .  carvedilol (COREG) 25 MG tablet TAKE 1 TABLET BY MOUTH TWICE DAILY WITH MEALS 60 tablet 5  . Cholecalciferol (VITAMIN D) 2000 UNITS tablet Take 2,000 Units by mouth daily.    Marland Kitchen ENTRESTO 97-103 MG TAKE 1 TABLET BY MOUTH TWICE DAILY 90 tablet 3  . famotidine (PEPCID) 40 MG tablet Take by mouth.    . meloxicam (MOBIC) 15 MG tablet Take 15 mg by mouth as needed.    . Multiple Vitamin (THERA) TABS Take by mouth.    . pantoprazole (PROTONIX) 40 MG tablet Take by mouth.    . torsemide (DEMADEX) 20 MG tablet Take 1 tablet (20 mg total) by mouth 2 (two) times daily. Please schedule annal appt with Dr.Allred for refills. [phone]. 1st attempt. 60 tablet 0   No current facility-administered medications for this visit.    Allergies:   Tomato   Social History: Social History   Socioeconomic History  . Marital status: Single    Spouse name: Not on file  . Number of children: 4  . Years of education: Not on file  . Highest education level: Not on file  Occupational History    Employer: [employer]  Tobacco Use  . Smoking status: Never Smoker  . Smokeless tobacco: Never Used  Substance and Sexual Activity  . Alcohol use: Yes    Comment: occ  . Drug use: No  . Sexual activity: Not on file  Other Topics Concern  . Not on  file  Social History Narrative  . Not on file   Social Determinants of Health   Financial Resource Strain:   . Difficulty of Paying Living Expenses:   Food Insecurity:   . Worried About Charity fundraiser in the Last Year:   . Arboriculturist in the Last Year:   Transportation Needs:   . Film/video editor (Medical):   Marland Kitchen Lack of Transportation (Non-Medical):   Physical Activity:   . Days of Exercise per Week:   . Minutes of Exercise per Session:   Stress:   . Feeling of Stress :   Social Connections:   . Frequency of Communication with Friends and Family:   . Frequency of Social Gatherings with Friends and Family:   . Attends Religious Services:     . Active Member of Clubs or Organizations:   . Attends Archivist Meetings:   Marland Kitchen Marital Status:   Intimate Partner Violence:   . Fear of Current or Ex-Partner:   . Emotionally Abused:   Marland Kitchen Physically Abused:   . Sexually Abused:     Family History: Family History  Problem Relation Age of Onset  . CAD Mother     Review of Systems: All other systems reviewed and are otherwise negative except as noted above.   Physical Exam: Vitals:   11/04/19 1201  BP: 118/78  Pulse: 75  SpO2: 98%  Weight: 236 lb (107 kg)     GEN- The patient is well appearing, alert and oriented x 3 today.   HEENT: normocephalic, atraumatic; sclera clear, conjunctiva pink; hearing intact; oropharynx clear; neck supple, no JVP Lymph- no cervical lymphadenopathy Lungs- Clear to ausculation bilaterally, normal work of breathing.  No wheezes, rales, rhonchi Heart- Regular rate and rhythm, no murmurs, rubs or gallops, PMI not laterally displaced GI- soft, non-tender, non-distended, bowel sounds present, no hepatosplenomegaly Extremities- no clubbing, cyanosis, or edema; DP/PT/radial pulses 2+ bilaterally MS- no significant deformity or atrophy Skin- warm and dry, no rash or lesion; ICD pocket well healed Psych- euthymic mood, full affect Neuro- strength and sensation are intact  ICD interrogation- reviewed in detail today,  See PACEART report  EKG:  EKG is ordered today. The ekg ordered today shows A sensed V paced rhythm at 76 bpm, paced QRS of 144 ms. (Stable)  Recent Labs: 02/06/2019: Hemoglobin 12.8; Platelets 313   Wt Readings from Last 3 Encounters:  11/04/19 236 lb (107 kg)  09/02/18 229 lb (103.9 kg)  03/19/18 223 lb (101.2 kg)     Other studies Reviewed: Additional studies/ records that were reviewed today include: Previous EP office notes   Assessment and Plan:  1.  Chronic systolic dysfunction s/p St. Jude CRT-D  euvolemic today Stable on an appropriate medical  regimen Normal ICD function See Pace Art report No changes today  2. Obesity Body mass index is 38.09 kg/m.   3. HTN Stable on current regimen  4. GERD She has severe mechanical reflux and is pending possible gastric sleeve revision  Current medicines are reviewed at length with the patient today.   The patient does not have concerns regarding her medicines.  The following changes were made today:  none  Labs/ tests ordered today include:  No orders of the defined types were placed in this encounter.  Disposition:   Follow up with EP APP in 6 months.  Jacalyn Lefevre, PA-C  11/04/2019 12:09 PM  [facility] [address] [place]  [place] [address] [phone] (office) [phone] (fax)

## 2019-11-04 ENCOUNTER — Encounter: Payer: Self-pay | Admitting: Student

## 2019-11-04 ENCOUNTER — Ambulatory Visit (INDEPENDENT_AMBULATORY_CARE_PROVIDER_SITE_OTHER): Payer: 59 | Admitting: Student

## 2019-11-04 ENCOUNTER — Other Ambulatory Visit: Payer: Self-pay

## 2019-11-04 VITALS — BP 118/78 | HR 75 | Wt 236.0 lb

## 2019-11-04 DIAGNOSIS — Z95810 Presence of automatic (implantable) cardiac defibrillator: Secondary | ICD-10-CM

## 2019-11-04 DIAGNOSIS — I5042 Chronic combined systolic (congestive) and diastolic (congestive) heart failure: Secondary | ICD-10-CM | POA: Diagnosis not present

## 2019-11-04 DIAGNOSIS — I10 Essential (primary) hypertension: Secondary | ICD-10-CM

## 2019-11-04 LAB — CUP PACEART INCLINIC DEVICE CHECK
Battery Remaining Longevity: 22 mo
Brady Statistic RA Percent Paced: 2.8 %
Brady Statistic RV Percent Paced: 97 %
Date Time Interrogation Session: 20210615131319
HighPow Impedance: 68.625
Implantable Lead Implant Date: 20150715
Implantable Lead Implant Date: 20150715
Implantable Lead Implant Date: 20150715
Implantable Lead Location: 753858
Implantable Lead Location: 753859
Implantable Lead Location: 753860
Implantable Pulse Generator Implant Date: 20150715
Lead Channel Impedance Value: 387.5 Ohm
Lead Channel Impedance Value: 475 Ohm
Lead Channel Impedance Value: 550 Ohm
Lead Channel Pacing Threshold Amplitude: 0.75 V
Lead Channel Pacing Threshold Amplitude: 0.75 V
Lead Channel Pacing Threshold Amplitude: 1 V
Lead Channel Pacing Threshold Amplitude: 1 V
Lead Channel Pacing Threshold Pulse Width: 0.4 ms
Lead Channel Pacing Threshold Pulse Width: 0.4 ms
Lead Channel Pacing Threshold Pulse Width: 0.4 ms
Lead Channel Pacing Threshold Pulse Width: 0.4 ms
Lead Channel Sensing Intrinsic Amplitude: 11.9 mV
Lead Channel Sensing Intrinsic Amplitude: 5 mV
Lead Channel Setting Pacing Amplitude: 2 V
Lead Channel Setting Pacing Amplitude: 2 V
Lead Channel Setting Pacing Amplitude: 2 V
Lead Channel Setting Pacing Pulse Width: 0.4 ms
Lead Channel Setting Pacing Pulse Width: 0.4 ms
Lead Channel Setting Sensing Sensitivity: 0.4 mV
Pulse Gen Serial Number: 7198577

## 2019-11-04 LAB — BASIC METABOLIC PANEL
BUN/Creatinine Ratio: 15 (ref 9–23)
BUN: 13 mg/dL (ref 6–24)
CO2: 25 mmol/L (ref 20–29)
Calcium: 9.1 mg/dL (ref 8.7–10.2)
Chloride: 103 mmol/L (ref 96–106)
Creatinine, Ser: 0.85 mg/dL (ref 0.57–1.00)
GFR calc Af Amer: 90 mL/min/{1.73_m2} (ref >59)
GFR calc non Af Amer: 78 mL/min/{1.73_m2} (ref >59)
Glucose: 104 mg/dL — ABNORMAL HIGH (ref 65–99)
Potassium: 3.9 mmol/L (ref 3.5–5.2)
Sodium: 141 mmol/L (ref 134–144)

## 2019-11-04 NOTE — Patient Instructions (Signed)
Medication Instructions:  *If you need a refill on your cardiac medications before your next appointment, please call your pharmacy*  Lab Work: Your physician has recommended that you have lab work today: BMET   If you have labs (blood work) drawn today and your tests are completely normal, you will receive your results only by: Marland Kitchen MyChart Message (if you have MyChart) OR . A paper copy in the mail If you have any lab test that is abnormal or we need to change your treatment, we will call you to review the results.  Testing/Procedures: NONE  Follow-Up: At [facility], you and your health needs are our priority.  As part of our continuing mission to provide you with exceptional heart care, we have created designated Provider Care Teams.  These Care Teams include your primary Cardiologist (physician) and Advanced Practice Providers (APPs -  Physician Assistants and Nurse Practitioners) who all work together to provide you with the care you need, when you need it.  We recommend signing up for the patient portal called "MyChart".  Sign up information is provided on this After Visit Summary.  MyChart is used to connect with patients for Virtual Visits (Telemedicine).  Patients are able to view lab/test results, encounter notes, upcoming appointments, etc.  Non-urgent messages can be sent to your provider as well.   To learn more about what you can do with MyChart, go to [email or web address].    Your next appointment:   Your physician wants you to follow-up in: 6 MONTHS with an EP APP. You will receive a reminder letter in the mail two months in advance. If you don't receive a letter, please call our office to schedule the follow-up appointment.  The format for your next appointment:   In Person  Provider:   You may see one of the following Advanced Practice Providers on your designated Care Team:    Chanetta Marshall, NP  Tommye Standard, PA-C  Legrand Como "Oda Kilts, [place]  Other  Instructions

## 2019-11-17 ENCOUNTER — Ambulatory Visit (INDEPENDENT_AMBULATORY_CARE_PROVIDER_SITE_OTHER): Payer: 59

## 2019-11-17 DIAGNOSIS — Z95810 Presence of automatic (implantable) cardiac defibrillator: Secondary | ICD-10-CM

## 2019-11-17 DIAGNOSIS — I5042 Chronic combined systolic (congestive) and diastolic (congestive) heart failure: Secondary | ICD-10-CM | POA: Diagnosis not present

## 2019-11-19 NOTE — Progress Notes (Signed)
EPIC Encounter for ICM Monitoring  Patient Name: Darlene Pierce is a 54 y.o. female Date: 11/19/2019 Primary Care Physican: Vicenta Aly, [place] Primary Cardiologist: [place] Electrophysiologist: Allred Bi-V Pacing: 96% 6/15/2021Weight: 236 lbs   Spoke with patient and reports feeling well at this time.  Denies fluid symptoms.    CorvueThoracic impedancenormal.   Prescribed:  Torsemide 20 mg 1 tablet twice a day.   Labs: 11/04/2019 Creatinine 0.85, BUN 13, Potassium 3.9, Sodium 141, GFR 78-90 06/22/2019 Creatinine0.93, BUN24, Potassium3.5, Sodium139, [facility] Care Everywhere 06/21/2019 Creatinine0.98, Allene Pyo, [id number], [facility] Care Everywhere 06/20/2019 Creatinine1.22, BUN24, Potassium3.1, [id number], [facility] Care Everywhere 01/28/2021Creatinine 1.48, BUN26, Potassium3.2, [place], GFR47 Care Everywhere A complete set of results can be found in Results Review.  Recommendations: No changes and encouraged to call if experiencing any fluid symptoms.  Follow-up plan: ICM clinic phone appointment on8/06/2019.91 day device clinic remote transmission scheduled for7/10/2019.    Copy of ICM check sent to Dr.Allred  3 month ICM trend: 11/17/2019    1 Year ICM trend:       [name], RN 11/19/2019 8:29 AM

## 2019-11-25 ENCOUNTER — Ambulatory Visit (INDEPENDENT_AMBULATORY_CARE_PROVIDER_SITE_OTHER): Payer: 59 | Admitting: *Deleted

## 2019-11-25 DIAGNOSIS — I428 Other cardiomyopathies: Secondary | ICD-10-CM

## 2019-11-26 LAB — CUP PACEART REMOTE DEVICE CHECK
Battery Remaining Longevity: 23 mo
Battery Remaining Percentage: 27 %
Battery Voltage: 2.83 V
Brady Statistic AP VP Percent: 4 %
Brady Statistic AP VS Percent: 1 %
Brady Statistic AS VP Percent: 92 %
Brady Statistic AS VS Percent: 3 %
Brady Statistic RA Percent Paced: 3.1 %
Date Time Interrogation Session: 20210707004320
HighPow Impedance: 79 Ohm
HighPow Impedance: 79 Ohm
Implantable Lead Implant Date: 20150715
Implantable Lead Implant Date: 20150715
Implantable Lead Implant Date: 20150715
Implantable Lead Location: 753858
Implantable Lead Location: 753859
Implantable Lead Location: 753860
Implantable Pulse Generator Implant Date: 20150715
Lead Channel Impedance Value: 400 Ohm
Lead Channel Impedance Value: 490 Ohm
Lead Channel Impedance Value: 550 Ohm
Lead Channel Pacing Threshold Amplitude: 0.75 V
Lead Channel Pacing Threshold Amplitude: 0.875 V
Lead Channel Pacing Threshold Amplitude: 0.875 V
Lead Channel Pacing Threshold Pulse Width: 0.4 ms
Lead Channel Pacing Threshold Pulse Width: 0.4 ms
Lead Channel Pacing Threshold Pulse Width: 0.4 ms
Lead Channel Sensing Intrinsic Amplitude: 11.7 mV
Lead Channel Sensing Intrinsic Amplitude: 5 mV
Lead Channel Setting Pacing Amplitude: 2 V
Lead Channel Setting Pacing Amplitude: 2 V
Lead Channel Setting Pacing Amplitude: 2 V
Lead Channel Setting Pacing Pulse Width: 0.4 ms
Lead Channel Setting Pacing Pulse Width: 0.4 ms
Lead Channel Setting Sensing Sensitivity: 0.4 mV
Pulse Gen Serial Number: 7198577

## 2019-11-26 NOTE — Progress Notes (Signed)
Remote ICD transmission.   

## 2019-12-02 ENCOUNTER — Other Ambulatory Visit (HOSPITAL_COMMUNITY): Payer: Self-pay | Admitting: Internal Medicine

## 2019-12-22 ENCOUNTER — Ambulatory Visit (INDEPENDENT_AMBULATORY_CARE_PROVIDER_SITE_OTHER): Payer: 59

## 2019-12-22 DIAGNOSIS — Z95810 Presence of automatic (implantable) cardiac defibrillator: Secondary | ICD-10-CM | POA: Diagnosis not present

## 2019-12-22 DIAGNOSIS — I5042 Chronic combined systolic (congestive) and diastolic (congestive) heart failure: Secondary | ICD-10-CM | POA: Diagnosis not present

## 2019-12-26 NOTE — Progress Notes (Signed)
EPIC Encounter for ICM Monitoring  Patient Name: Darlene Pierce is a 54 y.o. female Date: 12/26/2019 Primary Care Physican: Vicenta Aly, [place] Primary Cardiologist: [place] Electrophysiologist: Allred Bi-V Pacing: 94% 8/6/2021Weight: 236.5 lbs    Spoke with patient and reports feeling well at this time.  Denies fluid symptoms.  Weight increased to 241 lbs and has decreased back toward baseline after taking 2 days of extra Torsemide.          She asked who will fill out DMV forms to get handicap parking placard and advised to send to last physician seen which was her PCP.          Advised it has been several years since she has seen Dr Haroldine Laws and encouraged her to call the office for appointment.       She's had 2 ER visits related to severe reflux.  The bariatric physician that did her gastric sleeve will need to revise the surgery due to reflux.   CorvueThoracic impedancesuggesting possible fluid accumulation starting 12/22/2019 but has returned close to baseline.   Prescribed:  Torsemide 20 mg 1 tablet twice a day.   Labs: 11/04/2019 Creatinine 0.85, BUN 13, Potassium 3.9, Sodium 141, GFR 78-90 06/22/2019 Creatinine0.93, BUN24, Potassium3.5, Sodium139, [facility] Care Everywhere 06/21/2019 Creatinine0.98, Allene Pyo, [id number], [facility] Care Everywhere 06/20/2019 Creatinine1.22, BUN24, Potassium3.1, [id number], [facility] Care Everywhere 01/28/2021Creatinine 1.48, BUN26, Potassium3.2, [place], GFR47 Care Everywhere A complete set of results can be found in Results Review.  Recommendations: Pt self adjusted Torsemide x 2 days and will take another extra one tomorrow if needed.  Advised to call Dr Haroldine Laws to schedule overdue office visit appointment.   Follow-up plan: ICM clinic phone appointment on 9/13/202110.   91 day device clinic remote transmission 02/24/2020.    EP/Cardiology Office Visits: Recall for 05/02/2020 with Dr. Rayann Heman.    Copy  of ICM check sent to Dr. Rayann Heman.   Direct Trend Viewer through 12/26/2019   3 month ICM trend: 12/25/2019    1 Year ICM trend:       [name], RN 12/26/2019 12:05 PM

## 2020-01-06 ENCOUNTER — Other Ambulatory Visit (HOSPITAL_COMMUNITY): Payer: Self-pay | Admitting: Internal Medicine

## 2020-02-09 NOTE — Progress Notes (Signed)
No ICM remote transmission received for 02/02/2020 and next ICM transmission scheduled for 02/25/2020.

## 2020-02-24 ENCOUNTER — Ambulatory Visit (INDEPENDENT_AMBULATORY_CARE_PROVIDER_SITE_OTHER): Payer: 59

## 2020-02-24 DIAGNOSIS — I5042 Chronic combined systolic (congestive) and diastolic (congestive) heart failure: Secondary | ICD-10-CM | POA: Diagnosis not present

## 2020-02-24 DIAGNOSIS — Z95810 Presence of automatic (implantable) cardiac defibrillator: Secondary | ICD-10-CM

## 2020-02-26 ENCOUNTER — Ambulatory Visit (INDEPENDENT_AMBULATORY_CARE_PROVIDER_SITE_OTHER): Payer: 59

## 2020-02-26 DIAGNOSIS — I5042 Chronic combined systolic (congestive) and diastolic (congestive) heart failure: Secondary | ICD-10-CM

## 2020-02-26 DIAGNOSIS — Z95810 Presence of automatic (implantable) cardiac defibrillator: Secondary | ICD-10-CM

## 2020-02-26 NOTE — Progress Notes (Signed)
EPIC Encounter for ICM Monitoring  Patient Name: Darlene Pierce is a 54 y.o. female Date: 02/26/2020 Primary Care Physican: Vicenta Aly, [place] Primary Cardiologist: [place] Electrophysiologist: Allred Bi-V Pacing: 95% 10/7/2021Weight: 220 lbs    Spoke with patient and reports feeling well at this time. Denies fluid symptoms. She had bariatric sleeve revision in September and was not receiving fluid pill during her hospitalization which correlates with decreased impedance.         She has resumed her Torsemide dosage and doing fine.   CorvueThoracic impedancenormal but was suggesting possible fluid accumulation from 9/11-10/3 with a few days at baseline.   Prescribed:  Torsemide 20 mg 1 tablet twice a day.   Labs: 11/04/2019 Creatinine 0.85, BUN 13, Potassium 3.9, Sodium 141, GFR 78-90 06/22/2019 Creatinine0.93, BUN24, Potassium3.5, Sodium139, [facility] Care Everywhere 06/21/2019 Creatinine0.98, Allene Pyo, [id number], [facility] Care Everywhere 06/20/2019 Creatinine1.22, BUN24, Potassium3.1, [id number], [facility] Care Everywhere 01/28/2021Creatinine 1.48, BUN26, Potassium3.2, [place], GFR47 Care Everywhere A complete set of results can be found in Results Review.  Recommendations: No changes and encouraged to call if experiencing any fluid symptoms.  Follow-up plan: ICM clinic phone appointment on 03/29/2020.   91 day device clinic remote transmission not scheduled.    EP/Cardiology Office Visits: Recall for 05/02/2020 with Dr. Rayann Heman.    Copy of ICM check sent to Dr. Rayann Heman.   3 month ICM trend: 02/26/2020    1 Year ICM trend:       [name], RN 02/26/2020 5:13 PM

## 2020-02-27 LAB — CUP PACEART REMOTE DEVICE CHECK
Battery Remaining Longevity: 22 mo
Battery Remaining Percentage: 25 %
Battery Voltage: 2.81 V
Brady Statistic AP VP Percent: 6.2 %
Brady Statistic AP VS Percent: 1 %
Brady Statistic AS VP Percent: 89 %
Brady Statistic AS VS Percent: 3.2 %
Brady Statistic RA Percent Paced: 5.7 %
Date Time Interrogation Session: 20211007095920
HighPow Impedance: 69 Ohm
HighPow Impedance: 69 Ohm
Implantable Lead Implant Date: 20150715
Implantable Lead Implant Date: 20150715
Implantable Lead Implant Date: 20150715
Implantable Lead Location: 753858
Implantable Lead Location: 753859
Implantable Lead Location: 753860
Implantable Pulse Generator Implant Date: 20150715
Lead Channel Impedance Value: 350 Ohm
Lead Channel Impedance Value: 410 Ohm
Lead Channel Impedance Value: 510 Ohm
Lead Channel Pacing Threshold Amplitude: 0.75 V
Lead Channel Pacing Threshold Amplitude: 1 V
Lead Channel Pacing Threshold Amplitude: 1 V
Lead Channel Pacing Threshold Pulse Width: 0.4 ms
Lead Channel Pacing Threshold Pulse Width: 0.4 ms
Lead Channel Pacing Threshold Pulse Width: 0.4 ms
Lead Channel Sensing Intrinsic Amplitude: 11.7 mV
Lead Channel Sensing Intrinsic Amplitude: 5 mV
Lead Channel Setting Pacing Amplitude: 2 V
Lead Channel Setting Pacing Amplitude: 2 V
Lead Channel Setting Pacing Amplitude: 2 V
Lead Channel Setting Pacing Pulse Width: 0.4 ms
Lead Channel Setting Pacing Pulse Width: 0.4 ms
Lead Channel Setting Sensing Sensitivity: 0.4 mV
Pulse Gen Serial Number: 7198577

## 2020-02-27 NOTE — Progress Notes (Signed)
Remote ICD transmission.   

## 2020-03-29 ENCOUNTER — Ambulatory Visit (INDEPENDENT_AMBULATORY_CARE_PROVIDER_SITE_OTHER): Payer: 59

## 2020-03-29 DIAGNOSIS — I5042 Chronic combined systolic (congestive) and diastolic (congestive) heart failure: Secondary | ICD-10-CM

## 2020-03-29 DIAGNOSIS — Z95810 Presence of automatic (implantable) cardiac defibrillator: Secondary | ICD-10-CM

## 2020-04-02 NOTE — Progress Notes (Signed)
Unable to follow up with patient on remote transmission due to patient transferred to [facility].  No further ICM follow up.

## 2020-04-29 ENCOUNTER — Other Ambulatory Visit (HOSPITAL_COMMUNITY): Payer: Self-pay | Admitting: Internal Medicine

## 2020-09-27 ENCOUNTER — Other Ambulatory Visit: Payer: Self-pay

## 2020-09-27 ENCOUNTER — Other Ambulatory Visit: Payer: Self-pay | Admitting: Internal Medicine

## 2020-09-27 MED ORDER — CARVEDILOL 25 MG PO TABS: 25.0000 mg | ORAL_TABLET | Freq: Two times a day (BID) | ORAL | 1 refills | Status: AC

## 2021-03-28 ENCOUNTER — Other Ambulatory Visit: Payer: Self-pay

## 2021-03-28 MED ORDER — ENTRESTO 97-103 MG PO TABS: 1.0000 | ORAL_TABLET | Freq: Two times a day (BID) | ORAL | 0 refills | Status: DC

## 2021-05-16 ENCOUNTER — Other Ambulatory Visit: Payer: Self-pay | Admitting: Internal Medicine

## 2021-08-11 ENCOUNTER — Other Ambulatory Visit: Payer: Self-pay | Admitting: Internal Medicine
